# Patient Record
Sex: Male | Born: 1945
Health system: Southern US, Community
[De-identification: ages and names within clinical notes are randomized; demographics above are authoritative.]

## PROBLEM LIST (undated history)

## (undated) DIAGNOSIS — L4050 Arthropathic psoriasis, unspecified: Secondary | ICD-10-CM

## (undated) DIAGNOSIS — M26629 Arthralgia of temporomandibular joint, unspecified side: Secondary | ICD-10-CM

## (undated) DIAGNOSIS — M1990 Unspecified osteoarthritis, unspecified site: Secondary | ICD-10-CM

## (undated) DIAGNOSIS — N400 Enlarged prostate without lower urinary tract symptoms: Secondary | ICD-10-CM

## (undated) DIAGNOSIS — I2510 Atherosclerotic heart disease of native coronary artery without angina pectoris: Secondary | ICD-10-CM

## (undated) DIAGNOSIS — E785 Hyperlipidemia, unspecified: Secondary | ICD-10-CM

## (undated) HISTORY — DX: Arthropathic psoriasis, unspecified: L40.50

## (undated) HISTORY — DX: Hyperlipidemia, unspecified: E78.5

## (undated) HISTORY — DX: Atherosclerotic heart disease of native coronary artery without angina pectoris: I25.10

## (undated) HISTORY — DX: Benign prostatic hyperplasia without lower urinary tract symptoms: N40.0

## (undated) HISTORY — PX: TONSILLECTOMY: SUR1361

## (undated) HISTORY — DX: Arthralgia of temporomandibular joint, unspecified side: M26.629

## (undated) HISTORY — DX: Unspecified osteoarthritis, unspecified site: M19.90

---

## 2002-09-28 ENCOUNTER — Encounter: Admission: RE | Admit: 2002-09-28 | Discharge: 2002-09-28 | Payer: Self-pay | Admitting: Internal Medicine

## 2002-09-28 ENCOUNTER — Encounter: Payer: Self-pay | Admitting: Internal Medicine

## 2003-02-23 ENCOUNTER — Encounter (INDEPENDENT_AMBULATORY_CARE_PROVIDER_SITE_OTHER): Payer: Self-pay | Admitting: Surgery

## 2003-02-23 ENCOUNTER — Ambulatory Visit (HOSPITAL_BASED_OUTPATIENT_CLINIC_OR_DEPARTMENT_OTHER): Admission: RE | Admit: 2003-02-23 | Discharge: 2003-02-23 | Payer: Self-pay | Admitting: Surgery

## 2004-06-17 ENCOUNTER — Ambulatory Visit: Payer: Self-pay | Admitting: Internal Medicine

## 2004-08-10 HISTORY — PX: COLONOSCOPY: SHX174

## 2004-10-01 ENCOUNTER — Encounter: Admission: RE | Admit: 2004-10-01 | Discharge: 2004-10-01 | Payer: Self-pay | Admitting: Internal Medicine

## 2004-12-29 ENCOUNTER — Ambulatory Visit: Payer: Self-pay | Admitting: Internal Medicine

## 2005-01-30 ENCOUNTER — Ambulatory Visit: Payer: Self-pay | Admitting: Gastroenterology

## 2005-02-13 ENCOUNTER — Ambulatory Visit: Payer: Self-pay | Admitting: Gastroenterology

## 2005-07-29 ENCOUNTER — Ambulatory Visit: Payer: Self-pay | Admitting: Internal Medicine

## 2007-03-23 ENCOUNTER — Ambulatory Visit: Payer: Self-pay | Admitting: Internal Medicine

## 2007-03-23 DIAGNOSIS — H902 Conductive hearing loss, unspecified: Secondary | ICD-10-CM | POA: Insufficient documentation

## 2007-03-31 ENCOUNTER — Encounter (INDEPENDENT_AMBULATORY_CARE_PROVIDER_SITE_OTHER): Payer: Self-pay | Admitting: *Deleted

## 2007-04-25 ENCOUNTER — Ambulatory Visit: Payer: Self-pay | Admitting: Internal Medicine

## 2007-04-25 LAB — CONVERTED CEMR LAB
Cholesterol, target level: 200 mg/dL
HDL goal, serum: 40 mg/dL
LDL Goal: 160 mg/dL

## 2007-04-28 ENCOUNTER — Encounter: Payer: Self-pay | Admitting: Internal Medicine

## 2007-04-28 ENCOUNTER — Ambulatory Visit: Admission: RE | Admit: 2007-04-28 | Discharge: 2007-04-28 | Payer: Self-pay | Admitting: Internal Medicine

## 2007-06-24 ENCOUNTER — Encounter: Payer: Self-pay | Admitting: Internal Medicine

## 2007-10-17 ENCOUNTER — Ambulatory Visit: Payer: Self-pay | Admitting: Internal Medicine

## 2007-10-28 LAB — CONVERTED CEMR LAB
Cholesterol: 194 mg/dL (ref 0–200)
HDL: 43.9 mg/dL (ref >39.0)
LDL Cholesterol: 129 mg/dL — ABNORMAL HIGH (ref 0–99)
Total CHOL/HDL Ratio: 4.4
Triglycerides: 108 mg/dL (ref 0–149)
VLDL: 22 mg/dL (ref 0–40)

## 2007-11-01 ENCOUNTER — Encounter (INDEPENDENT_AMBULATORY_CARE_PROVIDER_SITE_OTHER): Payer: Self-pay | Admitting: *Deleted

## 2008-04-19 ENCOUNTER — Encounter (INDEPENDENT_AMBULATORY_CARE_PROVIDER_SITE_OTHER): Payer: Self-pay | Admitting: *Deleted

## 2008-04-19 ENCOUNTER — Ambulatory Visit: Payer: Self-pay | Admitting: Internal Medicine

## 2008-04-22 LAB — CONVERTED CEMR LAB
Free T4: 0.9 ng/dL (ref 0.6–1.6)
Hgb A1c MFr Bld: 5.4 % (ref 4.6–6.0)
TSH: 1.04 microintl units/mL (ref 0.35–5.50)

## 2008-04-23 ENCOUNTER — Encounter (INDEPENDENT_AMBULATORY_CARE_PROVIDER_SITE_OTHER): Payer: Self-pay | Admitting: *Deleted

## 2008-04-30 ENCOUNTER — Encounter: Payer: Self-pay | Admitting: Internal Medicine

## 2009-10-25 ENCOUNTER — Ambulatory Visit: Payer: Self-pay | Admitting: Internal Medicine

## 2009-10-25 DIAGNOSIS — E785 Hyperlipidemia, unspecified: Secondary | ICD-10-CM | POA: Insufficient documentation

## 2009-10-25 DIAGNOSIS — R3916 Straining to void: Secondary | ICD-10-CM

## 2009-10-25 DIAGNOSIS — N401 Enlarged prostate with lower urinary tract symptoms: Secondary | ICD-10-CM

## 2009-12-19 ENCOUNTER — Ambulatory Visit: Payer: Self-pay | Admitting: Internal Medicine

## 2009-12-23 LAB — CONVERTED CEMR LAB
ALT: 21 units/L (ref 0–53)
AST: 24 units/L (ref 0–37)
Albumin: 4 g/dL (ref 3.5–5.2)
Alkaline Phosphatase: 56 units/L (ref 39–117)
Bilirubin, Direct: 0.2 mg/dL (ref 0.0–0.3)
Cholesterol: 185 mg/dL (ref 0–200)
HDL: 50.8 mg/dL (ref >39.00)
LDL Cholesterol: 114 mg/dL — ABNORMAL HIGH (ref 0–99)
Total Bilirubin: 0.9 mg/dL (ref 0.3–1.2)
Total CHOL/HDL Ratio: 4
Total Protein: 6.7 g/dL (ref 6.0–8.3)
Triglycerides: 100 mg/dL (ref 0.0–149.0)
VLDL: 20 mg/dL (ref 0.0–40.0)

## 2010-02-25 ENCOUNTER — Telehealth (INDEPENDENT_AMBULATORY_CARE_PROVIDER_SITE_OTHER): Payer: Self-pay | Admitting: *Deleted

## 2010-07-14 ENCOUNTER — Ambulatory Visit: Payer: Self-pay | Admitting: Internal Medicine

## 2010-07-14 DIAGNOSIS — M26629 Arthralgia of temporomandibular joint, unspecified side: Secondary | ICD-10-CM | POA: Insufficient documentation

## 2010-08-28 ENCOUNTER — Encounter: Payer: Self-pay | Admitting: Internal Medicine

## 2010-08-28 DIAGNOSIS — H9190 Unspecified hearing loss, unspecified ear: Secondary | ICD-10-CM | POA: Insufficient documentation

## 2010-09-07 LAB — CONVERTED CEMR LAB
ALT: 21 units/L (ref 0–53)
ALT: 22 units/L (ref 0–53)
AST: 23 units/L (ref 0–37)
AST: 24 units/L (ref 0–37)
Albumin: 3.9 g/dL (ref 3.5–5.2)
Albumin: 4 g/dL (ref 3.5–5.2)
Alkaline Phosphatase: 62 units/L (ref 39–117)
Alkaline Phosphatase: 64 units/L (ref 39–117)
BUN: 19 mg/dL (ref 6–23)
BUN: 23 mg/dL (ref 6–23)
Basophils Absolute: 0 10*3/uL (ref 0.0–0.1)
Basophils Absolute: 0 10*3/uL (ref 0.0–0.1)
Basophils Relative: 0.1 % (ref 0.0–1.0)
Basophils Relative: 0.3 % (ref 0.0–3.0)
Bilirubin, Direct: 0.1 mg/dL (ref 0.0–0.3)
Bilirubin, Direct: 0.1 mg/dL (ref 0.0–0.3)
CO2: 32 meq/L (ref 19–32)
CO2: 34 meq/L — ABNORMAL HIGH (ref 19–32)
Calcium: 9.4 mg/dL (ref 8.4–10.5)
Calcium: 9.7 mg/dL (ref 8.4–10.5)
Chloride: 103 meq/L (ref 96–112)
Chloride: 103 meq/L (ref 96–112)
Cholesterol: 206 mg/dL — ABNORMAL HIGH (ref 0–200)
Creatinine, Ser: 1.1 mg/dL (ref 0.4–1.5)
Creatinine, Ser: 1.2 mg/dL (ref 0.4–1.5)
Direct LDL: 137.3 mg/dL
Eosinophils Absolute: 0.1 10*3/uL (ref 0.0–0.6)
Eosinophils Absolute: 0.1 10*3/uL (ref 0.0–0.7)
Eosinophils Relative: 1.1 % (ref 0.0–5.0)
Eosinophils Relative: 1.3 % (ref 0.0–5.0)
GFR calc Af Amer: 79 mL/min
GFR calc non Af Amer: 66 mL/min
GFR calc non Af Amer: 71.75 mL/min (ref >60)
Glucose, Bld: 100 mg/dL — ABNORMAL HIGH (ref 70–99)
Glucose, Bld: 97 mg/dL (ref 70–99)
HCT: 47 % (ref 39.0–52.0)
HCT: 47.6 % (ref 39.0–52.0)
HDL: 51.5 mg/dL (ref >39.00)
Hemoglobin: 15.7 g/dL (ref 13.0–17.0)
Hemoglobin: 16.7 g/dL (ref 13.0–17.0)
Lymphocytes Relative: 36.7 % (ref 12.0–46.0)
Lymphocytes Relative: 37.7 % (ref 12.0–46.0)
Lymphs Abs: 2.2 10*3/uL (ref 0.7–4.0)
MCHC: 33.5 g/dL (ref 30.0–36.0)
MCHC: 35.1 g/dL (ref 30.0–36.0)
MCV: 89.6 fL (ref 78.0–100.0)
MCV: 96.5 fL (ref 78.0–100.0)
Monocytes Absolute: 0.4 10*3/uL (ref 0.2–0.7)
Monocytes Absolute: 0.5 10*3/uL (ref 0.1–1.0)
Monocytes Relative: 6 % (ref 3.0–11.0)
Monocytes Relative: 8.2 % (ref 3.0–12.0)
Neutro Abs: 3.3 10*3/uL (ref 1.4–7.7)
Neutro Abs: 3.5 10*3/uL (ref 1.4–7.7)
Neutrophils Relative %: 53.5 % (ref 43.0–77.0)
Neutrophils Relative %: 55.1 % (ref 43.0–77.0)
PSA: 0.92 ng/mL (ref 0.10–4.00)
Platelets: 231 10*3/uL (ref 150.0–400.0)
Platelets: 255 10*3/uL (ref 150–400)
Potassium: 4 meq/L (ref 3.5–5.1)
Potassium: 4.2 meq/L (ref 3.5–5.1)
RBC: 4.87 M/uL (ref 4.22–5.81)
RBC: 5.32 M/uL (ref 4.22–5.81)
RDW: 11.7 % (ref 11.5–14.6)
RDW: 12 % (ref 11.5–14.6)
Sodium: 139 meq/L (ref 135–145)
Sodium: 142 meq/L (ref 135–145)
TSH: 1.26 microintl units/mL (ref 0.35–5.50)
TSH: 1.44 microintl units/mL (ref 0.35–5.50)
Total Bilirubin: 0.8 mg/dL (ref 0.3–1.2)
Total Bilirubin: 0.8 mg/dL (ref 0.3–1.2)
Total CHOL/HDL Ratio: 4
Total Protein: 6.9 g/dL (ref 6.0–8.3)
Total Protein: 7.1 g/dL (ref 6.0–8.3)
Triglycerides: 106 mg/dL (ref 0.0–149.0)
VLDL: 21.2 mg/dL (ref 0.0–40.0)
WBC: 6.1 10*3/uL (ref 4.5–10.5)
WBC: 6.5 10*3/uL (ref 4.5–10.5)

## 2010-09-09 NOTE — Progress Notes (Signed)
Summary: Call back request  Phone Note Call from Patient Call back at Work Phone [phone]   Caller: Patient Summary of Call: Message left on Triage VM: Please call (no other information provided)  Shonna Chock CMA  February 25, 2010 10:41 AM   Follow-up for Phone Call        Left message on machine for patient to return call when avaliable, Reason for call:   per patient request   Follow-up by: Shonna Chock CMA,  February 25, 2010 5:11 PM  Additional Follow-up for Phone Call Additional follow up Details #1::        Left message on VM informing to return call, patient requested that I call Additional Follow-up by: Shonna Chock CMA,  February 27, 2010 3:35 PM    Additional Follow-up for Phone Call Additional follow up Details #2::    Left message on VM for patient at home number informing him to please call me if we can assist him in any way. We received a message from him requesting a call and have tried to contact him with no call back./Chrae George [facility] CMA  February 27, 2010 5:05 PM

## 2010-09-09 NOTE — Assessment & Plan Note (Signed)
Summary: cpx//pt will fasting//lch   Vital Signs:  Patient profile:   65 year old male Height:      68.25 inches Weight:      175.2 pounds BMI:     26.54 Temp:     97.9 degrees F oral Pulse rate:   65 / minute Resp:     16 per minute BP sitting:   114 / 76  (left arm) Cuff size:   large  Vitals Entered By: Shonna Chock (October 25, 2009 8:51 AM)  Comments REVIEWED MED LIST, PATIENT AGREED DOSE AND INSTRUCTION CORRECT    History of Present Illness: Mr. Belair is here for a physical; he is asymptomatic.Preventive health care measures  discussed.  Preventive Screening-Counseling & Management  Alcohol-Tobacco     Smoking Status: quit  Caffeine-Diet-Exercise     Does Patient Exercise: yes  Allergies (verified): No Known Drug Allergies  Past History:  Past Medical History: Hyperlipidemia: NMR 2008: LDL 154(1758/1010), HDL  48,TG 147. 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  The patient denies anorexia, fever, weight loss, weight gain, vision loss, hoarseness, chest pain, syncope, dyspnea on exertion, peripheral edema, prolonged cough, headaches, hemoptysis, abdominal pain, melena, hematochezia, severe indigestion/heartburn, hematuria, incontinence, suspicious skin lesions, depression, unusual weight change, abnormal bleeding, enlarged lymph nodes, and angioedema.    Physical Exam  General:  well-nourished; alert,appropriate and cooperative throughout examination Head:  Normocephalic and atraumatic without obvious abnormalities. Pattern alopecia  Eyes:  No  corneal or conjunctival inflammation noted.Perrla. Funduscopic exam benign, without hemorrhages, exudates or papilledema.  Ears:  External ear exam shows no significant lesions or deformities.  Otoscopic examination reveals clear canals, tympanic membranes are intact bilaterally without bulging, retraction, inflammation or discharge. Hearing is grossly normal bilaterally. Nose:  External nasal examination shows no deformity or inflammation. Nasal mucosa are pink and moist without lesions or exudates. Septum to R Mouth:  Oral mucosa and oropharynx without lesions or exudates.  Teeth in good repair. Neck:  No deformities, masses, or tenderness noted. Lungs:  Normal respiratory effort, chest expands symmetrically. Lungs are clear to auscultation, no crackles or wheezes. Heart:  Normal rate and regular rhythm. S1 and S2 normal without gallop, murmur, click, rub. S4 Abdomen:  Bowel sounds positive,abdomen soft and non-tender without masses, organomegaly or hernias noted. Rectal:  No external abnormalities noted. Normal sphincter tone. No rectal masses or tenderness. Genitalia:  Testes bilaterally descended without nodularity, tenderness or masses. No scrotal masses or lesions. No penis lesions or urethral discharge.L varicocele.   Prostate:  no nodules, no asymmetry, no induration,  2+ enlarged.   Msk:  No deformity or scoliosis noted of thoracic or lumbar spine.   Pulses:  R and L carotid,radial,dorsalis pedis and posterior tibial pulses are full and equal bilaterally Extremities:  No clubbing, cyanosis, edema, or deformity noted with normal full range of motion of all joints.  Minor crepitus of knees  Neurologic:  alert & oriented X3 and DTRs symmetrical and normal.   Skin:  Intact without suspicious lesions or rashes Cervical Nodes:  No lymphadenopathy noted Axillary Nodes:  No palpable lymphadenopathy Psych:  memory  intact for recent and remote, normally interactive, and good eye contact.      Impression & Recommendations:  Problem # 1:  ROUTINE GENERAL MEDICAL EXAM@HEALTH  CARE FACL (ICD-V70.0)  Orders: Venipuncture (57846) TLB-Lipid Panel (80061-LIPID) TLB-BMP (Basic Metabolic Panel-BMET) (80048-METABOL) TLB-CBC Platelet - w/Differential (85025-CBCD) TLB-Hepatic/Liver Function Pnl (80076-HEPATIC) TLB-TSH (Thyroid Stimulating Hormone) (84443-TSH) TLB-PSA (Prostate Specific Antigen) (84153-PSA) EKG w/ Interpretation (93000)  Problem # 2:  HYPERLIPIDEMIA (ICD-272.4)  Orders: Venipuncture (96295) TLB-Lipid Panel (80061-LIPID) EKG w/ Interpretation (93000)  Problem # 3:  BENIGN PROSTATIC HYPERTROPHY (ICD-600.00)  Asymptomatic  Orders: Venipuncture (28413) TLB-PSA (Prostate Specific Antigen) (84153-PSA)  Complete Medication List: 1)  Asa 81mg   .... 1 by mouth qd  Patient Instructions: 1)  Consider annual flu shot. 2)  Take  coated  Aspirin  81 mg (1-3) every day.  Appended Document: cpx//pt will fasting//lch  Laboratory Results   Urine Tests   Date/Time Reported: October 25, 2009 10:05 AM   Routine Urinalysis   Color: yellow Appearance: Clear Glucose: negative   (Normal Range: Negative) Bilirubin: negative   (Normal Range: Negative) Ketone: negative   (Normal Range: Negative) Spec. Gravity: <1.005   (Normal Range: 1.003-1.035) Blood: negative   (Normal Range: Negative) pH: 6.5   (Normal Range: 5.0-8.0) Protein: negative   (Normal Range: Negative) Urobilinogen: negative   (Normal Range: 0-1) Nitrite: negative   (Normal Range: Negative) Leukocyte Esterace: negative   (Normal Range: Negative)    Comments: Floydene Flock  October 25, 2009 10:06 AM

## 2010-09-09 NOTE — Assessment & Plan Note (Signed)
Summary: jaw or ear pain/cbs   Vital Signs:  Patient profile:   65 year old male Weight:      179 pounds BMI:     27.12 Temp:     97.9 degrees F oral Pulse rate:   76 / minute Resp:     14 per minute BP sitting:   130 / 78  (left arm) Cuff size:   large  Vitals Entered By: Shonna Chock CMA (July 14, 2010 1:35 PM) CC: Right side jaw and ear pain x 2 weeks- worse when chewing , Ear pain   CC:  Right side jaw and ear pain x 2 weeks- worse when chewing  and Ear pain.  History of Present Illness: Ear Pain      This is a 65 year old man who presents with Ear pain X 2 weeks w/o injury. No exertional neck, jaw or neck pain. The patient denies ear discharge, sensation of fullness, hearing loss, tinnitus, fever, sinus pain, nasal discharge, and jaw click.  The pain is located anterior to  &  in the right ear after  chewing.  The pain is described as intermittent and  increased  by chewing.  The patient denies headache, night grinding of teeth, and popping or crackling sounds.  Rx: Advil with benefit.  Current Medications (verified): 1)  Asa 81mg  .... 1 By Mouth Qd 2)  Pravastatin Sodium 20 Mg Tabs (Pravastatin Sodium) .Marland Kitchen.. 1 At Bedtime  Allergies (verified): No Known Drug Allergies  Physical Exam  General:  well-nourished;alert,appropriate and cooperative throughout examination Ears:  External ear exam shows no significant lesions or deformities.  Otoscopic examination reveals clear canals, tympanic membranes are intact bilaterally without bulging, retraction, inflammation or discharge. Hearing is grossly normal bilaterally. Nose:  External nasal examination shows no deformity or inflammation. Nasal mucosa are pink and moist without lesions or exudates. Septum deviated to R Mouth:  Oral mucosa and oropharynx without lesions or exudates.  Teeth in good repair. Mild TMJ symptoms on R. No masseter tenderness Cervical Nodes:  No lymphadenopathy noted Axillary Nodes:  No palpable  lymphadenopathy   Impression & Recommendations:  Problem # 1:  TEMPOROMANDIBULAR JOINT PAIN (ICD-524.62)  Complete Medication List: 1)  Asa 81mg   .... 1 by mouth qd 2)  Pravastatin Sodium 20 Mg Tabs (Pravastatin sodium) .Marland Kitchen.. 1 at bedtime  Patient Instructions: 1)  Warm compresses three times a day as needed . Avoid gum , seeds & tough foods as discussed. Glucosamine 1500 mg once daily for 4-6 weeks. See your Dentist if no better. 2)  Take 400-600mg  of Ibuprofen (Advil, Motrin) with food every 4-6 hours as needed for relief of pain  with food, NOT at bedtime    Orders Added: 1)  Est. Patient Level III [16606]

## 2010-10-23 ENCOUNTER — Telehealth (INDEPENDENT_AMBULATORY_CARE_PROVIDER_SITE_OTHER): Payer: Self-pay | Admitting: *Deleted

## 2010-10-27 ENCOUNTER — Ambulatory Visit (INDEPENDENT_AMBULATORY_CARE_PROVIDER_SITE_OTHER): Payer: BC Managed Care – PPO | Admitting: Family Medicine

## 2010-10-27 ENCOUNTER — Encounter: Payer: Self-pay | Admitting: Family Medicine

## 2010-10-27 DIAGNOSIS — L03119 Cellulitis of unspecified part of limb: Secondary | ICD-10-CM | POA: Insufficient documentation

## 2010-10-27 DIAGNOSIS — L02419 Cutaneous abscess of limb, unspecified: Secondary | ICD-10-CM | POA: Insufficient documentation

## 2010-10-28 ENCOUNTER — Encounter: Payer: Self-pay | Admitting: Internal Medicine

## 2010-10-28 ENCOUNTER — Ambulatory Visit (INDEPENDENT_AMBULATORY_CARE_PROVIDER_SITE_OTHER): Payer: BC Managed Care – PPO | Admitting: Internal Medicine

## 2010-10-28 VITALS — BP 128/84 | HR 72 | Temp 98.2°F | Resp 16 | Ht 68.0 in | Wt 178.0 lb

## 2010-10-28 DIAGNOSIS — L0390 Cellulitis, unspecified: Secondary | ICD-10-CM

## 2010-10-28 DIAGNOSIS — Z8249 Family history of ischemic heart disease and other diseases of the circulatory system: Secondary | ICD-10-CM

## 2010-10-28 DIAGNOSIS — Z0000 Encounter for general adult medical examination without abnormal findings: Secondary | ICD-10-CM

## 2010-10-28 DIAGNOSIS — E785 Hyperlipidemia, unspecified: Secondary | ICD-10-CM

## 2010-10-28 DIAGNOSIS — N400 Enlarged prostate without lower urinary tract symptoms: Secondary | ICD-10-CM

## 2010-10-28 NOTE — Progress Notes (Signed)
  Subjective:    Patient ID: Jason Hart, male    DOB: 11-22-1945, 65 y.o.   MRN: [id number]  HPI Comments:  He is here for wellness exam; he is asymptomatic with the exception of active cellulitis of the right knee. He is on doxycycline as of  3/19/ 2012 from Dr. Beverely Low.    He is on pravastatin for hyperlipidemia. Significantly his father had heart attack at age 75. His prior advanced cholesterol testing in 2008 revealed an LDL or bad cholesterol of 914. This was comprised of 1758 total particles and 1000  small dense particles  for  risk of approximately 17-18%. His LDL goal would be less than 120 based on this advanced testing.     Review of Systems  Constitutional: Negative for fever, chills and fatigue.  HENT: Negative for hearing loss, rhinorrhea, trouble swallowing, dental problem and tinnitus.   Respiratory: Negative for cough, shortness of breath and wheezing.   Cardiovascular: Negative for chest pain, palpitations and leg swelling.  Gastrointestinal: Positive for nausea. Negative for abdominal pain, diarrhea, constipation and blood in stool.  Genitourinary: Negative for dysuria, hematuria, decreased urine volume and difficulty urinating.  Musculoskeletal: Positive for joint swelling and arthralgias. Back pain:  he continues to have pain in the knee related to the acute infection.  Skin: Positive for color change.  Neurological: Negative for light-headedness and headaches.  Hematological: Negative for adenopathy.  Psychiatric/Behavioral: Negative for sleep disturbance.       Objective:   Physical Exam  Constitutional: He appears well-nourished. No distress.  HENT:  Head: Normocephalic.  Right Ear: External ear normal.  Left Ear: External ear normal.  Nose: Nose normal.  Mouth/Throat: Oropharynx is clear and moist.        Septal deviation to the right  Eyes: Conjunctivae are normal. Pupils are equal, round, and reactive to light. No scleral icterus.  Neck: No  tracheal deviation present. No thyromegaly present.  Cardiovascular: Normal rate, regular rhythm and normal heart sounds.  Exam reveals no gallop and no friction rub.   No murmur heard. Pulmonary/Chest: Effort normal. He has no rales.  Abdominal: Soft. There is no tenderness.  Genitourinary: Rectum normal and penis normal.        A varicocele is present in the left scrotum. There is asymmetric benign prostatic hypertrophy. The right lobe of the prostate is 2+ increased compared to the left  which is 1.5+.  There is weakness in the left inguinal area but there is no true hernia.  Musculoskeletal:        Decreased range of motion of right knee; obvious enlargement right knee.  Lymphadenopathy:    He has no cervical adenopathy.  Skin: Skin is warm. There is erythema.        4 x 5 centimeter erythema right lateral knee with tenderness to palpation.  Psychiatric: He has a normal mood and affect. His behavior is normal.          Assessment & Plan:   #1 comprehensive evaluation completed    #2 active cellulitis right knee ; warning signs discussed. Referral for incision and drainage by orthopedist may be necessary if there is not rapid resolution   #3 hyperlipidemia ; LDL goal is less than 120   #4 family history of premature coronary artery disease ( V. 17.3). An advanced cholesterol test [facility] heart panel/ 1304X) will be completed once the acute cellulitis is resolved.

## 2010-10-28 NOTE — Progress Notes (Signed)
Summary: refill  Phone Note Refill Request Message from:  Fax from Pharmacy on October 23, 2010 2:36 PM  Refills Requested: Medication #1:  PRAVASTATIN SODIUM 20 MG TABS 1 at bedtime. [facility] [employer] - fax [phone]   Initial call taken by: Okey Regal [place],  October 23, 2010 2:39 PM    Prescriptions: PRAVASTATIN SODIUM 20 MG TABS (PRAVASTATIN SODIUM) 1 at bedtime  #90 x 0   Entered by:   Shonna Chock CMA   Authorized by:   Marga Melnick MD   Signed by:   Shonna Chock CMA on 10/23/2010   Method used:   Electronically to        [facility] [address] [address] (retail)       [address]       [place], [place]  [address]       Ph: [phone]       Fax: [phone]   RxID:   [id number]

## 2010-10-28 NOTE — Patient Instructions (Signed)
Measure the area of cellulitis by outlining    in ink Call if having fever or red streaks going up the leg or increasing pain.

## 2010-11-03 ENCOUNTER — Telehealth: Payer: Self-pay | Admitting: Internal Medicine

## 2010-11-03 NOTE — Telephone Encounter (Signed)
Patient was seen for physical on 3/20, but said that Dr Alwyn Ren did not run blood work due to infected place on leg---was given antibiotics for 4-5 days----says swelling on leg has gone down and place is greatly improved---patient was given appt with Dr Alwyn Ren for Magdalene Molly 3/29 to check out leg, then go to lab.  1)  Wants to check to see if Dr Alwyn Ren actually needs to check the leg or can he just have a lab appointment??   If it is just labs, I will need orders and codes please    2)   Does he need a refill for his antibiotic to carry him thru until Thursday??

## 2010-11-04 NOTE — Telephone Encounter (Signed)
Left msg for pt to return call.

## 2010-11-05 ENCOUNTER — Encounter: Payer: Self-pay | Admitting: Internal Medicine

## 2010-11-06 ENCOUNTER — Encounter: Payer: Self-pay | Admitting: Internal Medicine

## 2010-11-06 ENCOUNTER — Ambulatory Visit (INDEPENDENT_AMBULATORY_CARE_PROVIDER_SITE_OTHER): Payer: BC Managed Care – PPO | Admitting: Internal Medicine

## 2010-11-06 VITALS — BP 102/68 | HR 60 | Temp 97.9°F

## 2010-11-06 DIAGNOSIS — L02419 Cutaneous abscess of limb, unspecified: Secondary | ICD-10-CM

## 2010-11-06 MED ORDER — DOXYCYCLINE HYCLATE 100 MG PO CPEP: 100.0000 mg | ORAL_CAPSULE | Freq: Two times a day (BID) | ORAL | Status: AC

## 2010-11-06 NOTE — Patient Instructions (Signed)
Please report the warning signs of increasing pain or swelling, red streaks traveling up the right leg, and pus. If a pustule does appear,it  would be valuable to perform a culture for bacteria identification.

## 2010-11-06 NOTE — Progress Notes (Signed)
  Subjective:    Patient ID: Jason Hart, male    DOB: June 29, 1946, 65 y.o.   MRN: [id number]  HPI the cellulitic lesion over his right lateral knee did not show significant improvement until he squeezed it producing some purulent material. He did this on 3/23 and 3/24. Since that time it has decreased in size.  He denies fever chills or sweats.  He's had no adverse symptoms with the doxycycline.  He denied any swelling, redness, or pain in the joint itself.  He completed the doxycycline on 3/26.          Review of Systems     Objective:   Physical Exam there is an area of faint erythema and slight ballotability encompassing 26 x 35 mm over the lateral aspect of the right knee. This slight erythema over the lesion does blanch with pressure. He denies any tenderness to palpation.  There's mild crepitus in the right knee with range of motion but range of motion is normal  There is evidence of exfoliation around the lesion  Pedal pulses are intact and the skin is otherwise normal.  He has no inguinal  lymphadenopathy.        Assessment & Plan:  #1 cellulitis/abscess right lateral knee. There is no evidence of any joint involvement or lymphangitis.  There appears to be mild residual cellulitic process in this area ; it is slightly clinically much improved.  Plan: I would recommend continuing the doxycycline until the edema and erythema has resolved totally. Showed a pustule appear it is recommended that this be lanced and a culture taken if possible

## 2010-11-06 NOTE — Assessment & Plan Note (Signed)
Summary: left knee pain--PH   Vital Signs:  Patient profile:   65 year old male Height:      68.25 inches (173.35 cm) Weight:      177.13 pounds (80.51 kg) BMI:     26.83 Temp:     98.0 degrees F (36.67 degrees C) oral BP sitting:   120 / 70  (left arm)  Vitals Entered By: Lucious Groves CMA (October 27, 2010 9:00 AM) CC: C/O right knee pain x 4 days./kb Is Patient Diabetic? No Pain Assessment Patient in pain? yes     Location: knee Intensity: 7 Type: sharp Onset of pain  4 days Comments Patient notes that it is swollen and seems as if he was bitten by something.   History of Present Illness: 65 yo man here today for R knee pain.  first noticed small 'pimple' 6 days ago.  2 days later 'whole knee' began hurting.  + throbbing, redness, swelling, warmth.  pain w/ flexion.  no pain w/ extension.  no pain w/ weight bearing.  able to play golf yesterday.  took a 'couple of aleve' prior to golf yesterday.  Current Medications (verified): 1)  Asa 81mg  .... 1 By Mouth Qd 2)  Pravastatin Sodium 20 Mg Tabs (Pravastatin Sodium) .Marland Kitchen.. 1 At Bedtime  Allergies (verified): No Known Drug Allergies  Review of Systems      See HPI  Physical Exam  General:  well-nourished;alert,appropriate and cooperative throughout examination Pulses:  +2 popliteal, DP/PT Extremities:  R lateral knee erythema, edema, warmth.  3.5 cm area of erythema and induration w/ central pore.  no fluctuance or drainage.  full extension of knee, some limitation of flexion   Impression & Recommendations:  Problem # 1:  CELLULITIS, RIGHT KNEE (ICD-682.6) Assessment New pt w/ obvious infxn of the R knee.  no apparent abscess or septic joint at this time.  start Doxy to cover for possible MRSA.  reviewed red flags that prompt immediate return- including fever, red streaking, increased pain. His updated medication list for this problem includes:    Doxycycline Hyclate 100 Mg Caps (Doxycycline hyclate) .Marland Kitchen... Take 1 tab  twice a day.  take w/ food.  Complete Medication List: 1)  Asa 81mg   .... 1 by mouth qd 2)  Pravastatin Sodium 20 Mg Tabs (Pravastatin sodium) .Marland Kitchen.. 1 at bedtime 3)  Doxycycline Hyclate 100 Mg Caps (Doxycycline hyclate) .... Take 1 tab twice a day.  take w/ food.  Patient Instructions: 1)  Follow up as scheduled tomorrow for your physical- have Dr Alwyn Ren recheck your knee! 2)  Take the Doxycycline as directed- take w/ food to avoid upset stomach 3)  Alternate heat and ice on the knee 4)  Call with any questions or concerns 5)  Hang in there!!! Prescriptions: DOXYCYCLINE HYCLATE 100 MG CAPS (DOXYCYCLINE HYCLATE) Take 1 tab twice a day.  take w/ food.  #20 x 0   Entered and Authorized by:   Neena Rhymes MD   Signed by:   Neena Rhymes MD on 10/27/2010   Method used:   Electronically to        [facility] [address] (retail)       [address]       [place], [place]  [address]       Ph: [phone]       Fax: [phone]   RxID:   [id number]    Orders Added: 1)  Est. Patient Level III [id number]

## 2010-11-06 NOTE — Telephone Encounter (Signed)
Pt has office scheduled.

## 2010-12-26 NOTE — Op Note (Signed)
   NAME:  Jason Hart, Jason Hart                     ACCOUNT NO.:  [id number]   MEDICAL RECORD NO.:  [id number]                   PATIENT TYPE:  AMB   LOCATION:  [facility]                                  FACILITY:  [facility]   PHYSICIAN:  Velora Heckler, M.D.                DATE OF BIRTH:  [birth year]   DATE OF PROCEDURE:  02/23/2003  DATE OF DISCHARGE:                                 OPERATIVE REPORT   PREOPERATIVE DIAGNOSIS:  Cutaneous lesion, left posterior scalp with atypia.   POSTOPERATIVE DIAGNOSIS:  Cutaneous lesion, left posterior scalp with  atypia.   PROCEDURE:  Wide local excision of cutaneous lesion, left posterior scalp.   SURGEON:  Velora Heckler, M.D.   ANESTHESIA:  Local.   ESTIMATED BLOOD LOSS:  Minimal.   PREPARATION:  Betadine.   COMPLICATIONS:  None.   INDICATIONS FOR PROCEDURE:  The patient is a 65 year old white male who has  undergone an excision of a cutaneous lesion from the left posterior scalp on  two occasions by Dr. Leticia Clas.  Atypia was noted on both specimens.  The  surgical margin was not obtained.  The patient now comes to surgery for a  wide local excision of previous operative site.   DESCRIPTION OF PROCEDURE:  The procedure is done in the minor treatment room  at [facility].  The patient is brought to  the operating room and placed in the right lateral decubitus position.  Hair  is shaved from around the lesion at the left posterior scalp.  Skin is  prepped with Betadine.  The skin is anesthetized with local anesthetic.  An  elliptical incision is made with a #15 blade, so as to encompass the  previous old scar plus approximately a 2.0 mm margin of normal tissue.  Dissection was carried full thickness through the scalp into the  subcutaneous tissues and down to the galea fascia.  The specimen was  submitted to  pathology for review.  Hemostasis is obtained with the ophthalmologic  cautery.  The skin edges were  reapproximated with interrupted #3-0 nylon  simple sutures.  Bacitracin ointment was placed along the suture line.  The patient tolerated the procedure well.                                                Velora Heckler, M.D.    TMG/MEDQ  D:  02/23/2003  T:  02/25/2003  Job:  [id number]   cc:   Mertha Finders., M.D.  [id number] [place]  [place] [address]  Fax: [phone]

## 2011-03-10 ENCOUNTER — Other Ambulatory Visit: Payer: Self-pay | Admitting: Internal Medicine

## 2011-05-26 ENCOUNTER — Other Ambulatory Visit: Payer: BC Managed Care – PPO

## 2011-05-28 ENCOUNTER — Other Ambulatory Visit: Payer: Self-pay | Admitting: Internal Medicine

## 2011-05-28 DIAGNOSIS — Z0000 Encounter for general adult medical examination without abnormal findings: Secondary | ICD-10-CM

## 2011-05-29 ENCOUNTER — Other Ambulatory Visit (INDEPENDENT_AMBULATORY_CARE_PROVIDER_SITE_OTHER): Payer: BC Managed Care – PPO

## 2011-05-29 DIAGNOSIS — Z0000 Encounter for general adult medical examination without abnormal findings: Secondary | ICD-10-CM

## 2011-05-29 LAB — CBC WITH DIFFERENTIAL/PLATELET
Basophils Relative: 0.5 % (ref 0.0–3.0)
Eosinophils Relative: 1.3 % (ref 0.0–5.0)
HCT: 48.6 % (ref 39.0–52.0)
Hemoglobin: 16.3 g/dL (ref 13.0–17.0)
Lymphs Abs: 2.8 10*3/uL (ref 0.7–4.0)
MCV: 94.4 fl (ref 78.0–100.0)
Monocytes Absolute: 0.5 10*3/uL (ref 0.1–1.0)
Neutro Abs: 4.7 10*3/uL (ref 1.4–7.7)
Neutrophils Relative %: 58 % (ref 43.0–77.0)
RBC: 5.15 Mil/uL (ref 4.22–5.81)
WBC: 8.1 10*3/uL (ref 4.5–10.5)

## 2011-05-29 LAB — BASIC METABOLIC PANEL WITH GFR
BUN: 21 mg/dL (ref 6–23)
CO2: 30 meq/L (ref 19–32)
Calcium: 9 mg/dL (ref 8.4–10.5)
Chloride: 103 meq/L (ref 96–112)
Creatinine, Ser: 1.1 mg/dL (ref 0.4–1.5)
GFR: 71.39 mL/min
Glucose, Bld: 93 mg/dL (ref 70–99)
Potassium: 4.2 meq/L (ref 3.5–5.1)
Sodium: 140 meq/L (ref 135–145)

## 2011-05-29 LAB — LIPID PANEL
Cholesterol: 156 mg/dL (ref 0–200)
HDL: 46.9 mg/dL
LDL Cholesterol: 95 mg/dL (ref 0–99)
Total CHOL/HDL Ratio: 3
Triglycerides: 72 mg/dL (ref 0.0–149.0)
VLDL: 14.4 mg/dL (ref 0.0–40.0)

## 2011-05-29 LAB — TSH: TSH: 0.58 u[IU]/mL (ref 0.35–5.50)

## 2011-05-29 LAB — HEPATIC FUNCTION PANEL
ALT: 21 U/L (ref 0–53)
AST: 27 U/L (ref 0–37)
Albumin: 4.1 g/dL (ref 3.5–5.2)
Alkaline Phosphatase: 59 U/L (ref 39–117)
Bilirubin, Direct: 0 mg/dL (ref 0.0–0.3)
Total Bilirubin: 0.7 mg/dL (ref 0.3–1.2)
Total Protein: 7.3 g/dL (ref 6.0–8.3)

## 2011-05-29 LAB — PSA: PSA: 0.92 ng/mL (ref 0.10–4.00)

## 2011-05-29 NOTE — Progress Notes (Signed)
Labs only

## 2011-05-29 NOTE — Progress Notes (Signed)
Addended by: Legrand Como on: 05/29/2011 10:17 AM   Modules accepted: Orders

## 2011-06-16 ENCOUNTER — Encounter: Payer: Self-pay | Admitting: Internal Medicine

## 2011-06-18 ENCOUNTER — Ambulatory Visit: Payer: BC Managed Care – PPO | Admitting: Internal Medicine

## 2011-06-22 ENCOUNTER — Other Ambulatory Visit: Payer: Self-pay | Admitting: Internal Medicine

## 2011-06-22 ENCOUNTER — Ambulatory Visit: Payer: BC Managed Care – PPO | Admitting: Internal Medicine

## 2011-06-23 ENCOUNTER — Ambulatory Visit (INDEPENDENT_AMBULATORY_CARE_PROVIDER_SITE_OTHER): Payer: BC Managed Care – PPO | Admitting: Internal Medicine

## 2011-06-23 ENCOUNTER — Encounter: Payer: Self-pay | Admitting: Internal Medicine

## 2011-06-23 DIAGNOSIS — R0789 Other chest pain: Secondary | ICD-10-CM

## 2011-06-23 DIAGNOSIS — E785 Hyperlipidemia, unspecified: Secondary | ICD-10-CM

## 2011-06-23 MED ORDER — RANITIDINE HCL 150 MG PO TABS: 150.0000 mg | ORAL_TABLET | Freq: Two times a day (BID) | ORAL | Status: DC

## 2011-06-23 NOTE — Patient Instructions (Addendum)
Please  schedule Labs : free T4, free T3, TSH (794.5).  Please bring these instructions to that Lab appt.   Your chest symptoms may be related to dyspepsia/ reflux.The triggers for dyspepsia or "heart burn"  include stress; the "aspirin family" ; alcohol; peppermint; and caffeine (coffee, tea, cola, and chocolate). The aspirin family would include aspirin and the nonsteroidal agents such as ibuprofen &  Naproxen. Tylenol would not cause reflux. If having dyspepsia ; food & drink should be avoided for @ least 2 hours before going to bed.

## 2011-06-23 NOTE — Progress Notes (Signed)
  Subjective:    Patient ID: Jason Hart, male    DOB: 1945-10-10, 65 y.o.   MRN: [id number]  HPI Dyslipidemia assessment: Prior Advanced Lipid Testing: NMR 2008: LDL goal = < 115.   Family history of premature CAD/ MI: Father MI @ 14 .  Nutrition: no plan .  Exercise: 3x/ week weights . Diabetes : no . HTN: no. Smoking history  : quit in college .    Lab results reviewed :all @ goal except TSH is low normal.     Review of Systems Weight :  Stable; fatigue: no ; claudication: no; palpitations: no; abd pain/bowel changes: no ; myalgias:no;  syncope : no ; memory loss: no;skin changes: no.  For 3-4 months he describes substernal discomfort as "tightness" when he starts to exercise. This occurs not only working with weights and also walking up he'll playing golf. This will resolve with burping. It does not recur if he continues to exercise. The pain does not radiate to the back it is not associated with nausea or sweating.  He is unsure as to whether he only has these symptoms with exercise after eating. He denies dyspepsia, heartburn, dysphagia, rectal bleeding or melena.    Objective:   Physical Exam General appearance is one of good health and nourishment w/o distress.  Eyes: No conjunctival inflammation or scleral icterus is present. No proptosis or lid lag.  Neck: Thyroid is normal to palpation without nodularity  Oral exam: Dental hygiene is good; lips and gums are healthy appearing.There is no oropharyngeal erythema or exudate noted.   Heart:  Normal rate and regular rhythm. S1 and S2 normal without gallop, murmur, click, rub.S 4 with slurring  Lungs:Chest clear to auscultation; no wheezes, rhonchi,rales ,or rubs present.No increased work of breathing.   Abdomen: bowel sounds normal, soft and non-tender without masses, organomegaly or hernias noted.  No guarding or rebound   Skin:Warm & dry.  Intact without suspicious lesions or rashes ; no jaundice or  tenting.  Muscle skeletal: No onycholysis of the nails. No clubbing, cyanosis or edema present.  Lymphatic: No lymphadenopathy is noted about the head, neck, axilla  areas.              Assessment & Plan:   #1 dyslipidemia; values are @ goal. This is in the context of family history of premature coronary disease. No change in the pravastatin.  #2 atypical substernal chest discomfort. EKG and possibly stress test may be necessary.If these are negative and symptoms persist endoscopic evaluation may be  appropriate.  #3 low normal TSH; recheck indicated in 3 months with free T3 & free T4  to rule out early hyperthyroidism.  EKG is normal with no ischemic change. A trial of ranitidine will be initiated. A stress test will be scheduled if the symptoms persist

## 2011-07-21 ENCOUNTER — Ambulatory Visit: Payer: BC Managed Care – PPO | Admitting: Internal Medicine

## 2012-02-03 ENCOUNTER — Ambulatory Visit (INDEPENDENT_AMBULATORY_CARE_PROVIDER_SITE_OTHER): Payer: BC Managed Care – PPO | Admitting: Internal Medicine

## 2012-02-03 ENCOUNTER — Encounter: Payer: Self-pay | Admitting: Internal Medicine

## 2012-02-03 VITALS — BP 126/80 | HR 78 | Temp 97.9°F | Wt 177.2 lb

## 2012-02-03 DIAGNOSIS — M79672 Pain in left foot: Secondary | ICD-10-CM

## 2012-02-03 DIAGNOSIS — M79609 Pain in unspecified limb: Secondary | ICD-10-CM

## 2012-02-03 DIAGNOSIS — M79671 Pain in right foot: Secondary | ICD-10-CM

## 2012-02-03 NOTE — Patient Instructions (Addendum)
Dr Pearletha Forge is @ [facility] [phone]

## 2012-02-03 NOTE — Progress Notes (Signed)
  Subjective:    Patient ID: Jason Hart, male    DOB: 06-Mar-1946, 66 y.o.   MRN: [id number]  HPI  For several months he has had almost constant pain with ambulation between the pads of his feet in the midanterior area. This is worse immediately upon arising in the morning . Wearing bedroom sleep shoes have helped some. He'll take 3 Advil in the morning with some benefit. Arch supports did not help  This may have started in the context of his  golf  stance . It has been benefited by certain shoe wear particularly if the heel is flat.    Review of Systems He denies any claudication-type symptoms. He is not had redness, heat, or swelling in the joints. Skin no rash associated with the present symptoms.     Objective:   Physical Exam  He appears healthy and well-nourished in no acute distress  Deep tendon reflexes, strength, and tone are all normal.  Pedal pulses are excellent. There no ischemic skin changes. Hair growth is excellent of the toes.  He has some minor chronic toenail changes.  His arch tends to be flat. There is no tenderness to palpation or percussion over the arch of the foot when it is hyper flexed. No  Achilles tendon tenderness. There is tenderness to palpation in the mid anterior sole bilaterally at the bases of the  toes        Assessment & Plan:  #1 pain in the mid anterior sole of each foot; rule out bilateral Morton's neuroma or meta tarsal bursitis.  He does experience improvement with nonsteroidals; unfortunately this increases the risk of GI or cardiac issues. Referral to sports medicine specialist will be pursued as there may be a mechanical component to  his symptoms.

## 2012-02-08 ENCOUNTER — Ambulatory Visit (INDEPENDENT_AMBULATORY_CARE_PROVIDER_SITE_OTHER): Payer: BC Managed Care – PPO | Admitting: Family Medicine

## 2012-02-08 ENCOUNTER — Encounter: Payer: Self-pay | Admitting: Family Medicine

## 2012-02-08 VITALS — BP 142/92 | HR 73 | Temp 97.8°F | Ht 67.0 in | Wt 170.0 lb

## 2012-02-08 DIAGNOSIS — M79609 Pain in unspecified limb: Secondary | ICD-10-CM

## 2012-02-08 DIAGNOSIS — M7740 Metatarsalgia, unspecified foot: Secondary | ICD-10-CM

## 2012-02-08 DIAGNOSIS — M7750 Other enthesopathy of unspecified foot: Secondary | ICD-10-CM

## 2012-02-08 DIAGNOSIS — M79671 Pain in right foot: Secondary | ICD-10-CM

## 2012-02-12 ENCOUNTER — Encounter: Payer: Self-pay | Admitting: Family Medicine

## 2012-02-12 DIAGNOSIS — M7740 Metatarsalgia, unspecified foot: Secondary | ICD-10-CM | POA: Insufficient documentation

## 2012-02-12 DIAGNOSIS — M79671 Pain in right foot: Secondary | ICD-10-CM | POA: Insufficient documentation

## 2012-02-12 NOTE — Assessment & Plan Note (Signed)
2/2 metatarsalgia.  Metatarsal pads and sports insoles provided - felt more comfortable with these in place.  No evidence stress fracture and not involved in high impact activities as a risk factor.  Icing, relative rest, tylenol/nsaids as needed.  Consider custom orthotics in future vs showing patient how to order temporary ones with metatarsal pads.

## 2012-02-12 NOTE — Progress Notes (Signed)
  Subjective:    Patient ID: Jason Hart, male    DOB: 1945-11-30, 66 y.o.   MRN: [id number]  PCP: Dr. Alwyn Ren  HPI 66 yo M here for bilateral foot pain.  Patient denies known injury. States for past 5 months has had pain in bilateral forefoot area in mornings and worse at end of day. Worse with prolonged standing. No swelling or bruising. Plays golf - worse with swinging when on forefoot area. Tried different shoes, advil, online orthotics.  Past Medical History  Diagnosis Date  . Trigger point with temporomandibular joint pain   . BPH (benign prostatic hypertrophy)   . Conductive hearing loss   . Hyperlipidemia     Current Outpatient Prescriptions on File Prior to Visit  Medication Sig Dispense Refill  . aspirin 81 MG tablet Take 81 mg by mouth daily.        . pravastatin (PRAVACHOL) 20 MG tablet TAKE 1 TABLET AT BEDTIME.  90 tablet  3    Past Surgical History  Procedure Date  . Tonsillectomy   . Colonoscopy     Negative;due 2016  . Tonsillectomy     No Known Allergies  History   Social History  . Marital Status: Married    Spouse Name: N/A    Number of Children: N/A  . Years of Education: N/A   Occupational History  . SALES    Social History Main Topics  . Smoking status: Never Smoker   . Smokeless tobacco: Never Used  . Alcohol Use: Yes     Couple times weekly-wine   . Drug Use: No  . Sexually Active: Not on file   Other Topics Concern  . Not on file   Social History Narrative   REG EXERCISE    Family History  Problem Relation Age of Onset  . Cancer Mother   . Hyperlipidemia Father   . Heart attack Father   . Sudden death Father   . Hypertension Neg Hx   . Diabetes Neg Hx     BP 142/92  Pulse 73  Temp 97.8 F (36.6 C) (Oral)  Ht 5\' 7"  (1.702 m)  Wt 170 lb (77.111 kg)  BMI 26.63 kg/m2  Review of Systems See HPI above.    Objective:   Physical Exam Gen: NAD  Bilateral feet/ankles: Mild overpronation.  Reversal of  transverse arches but minimal callus.   FROM ankles with 5/5 strength. TTP plantar 3rd MT head, minimal 2nd and 4th bilaterally. Negative ant drawer and talar tilt.   Negative syndesmotic compression. Thompsons test negative. NV intact distally.  MSK u/s: No evidence cortical irregularity, edema, neovascularity to suggest stress fracture.    Assessment & Plan:  1. Bilateral foot pain - 2/2 metatarsalgia.  Metatarsal pads and sports insoles provided - felt more comfortable with these in place.  No evidence stress fracture and not involved in high impact activities as a risk factor.  Icing, relative rest, tylenol/nsaids as needed.  Consider custom orthotics in future vs showing patient how to order temporary ones with metatarsal pads.

## 2012-03-01 ENCOUNTER — Encounter: Payer: Self-pay | Admitting: Family Medicine

## 2012-03-01 ENCOUNTER — Ambulatory Visit (INDEPENDENT_AMBULATORY_CARE_PROVIDER_SITE_OTHER): Payer: BC Managed Care – PPO | Admitting: Family Medicine

## 2012-03-01 VITALS — BP 126/82 | HR 76 | Temp 97.7°F | Ht 67.0 in | Wt 170.0 lb

## 2012-03-01 DIAGNOSIS — M25531 Pain in right wrist: Secondary | ICD-10-CM

## 2012-03-01 DIAGNOSIS — M25539 Pain in unspecified wrist: Secondary | ICD-10-CM

## 2012-03-01 NOTE — Patient Instructions (Addendum)
You have carpal tunnel syndrome. Wear the wrist splint at nighttime and as often as possible during the day Take ibuprofen or aleve for pain and inflammation. Corticosteroid injection is a consideration to help with pain and inflammation if not improving as expected over next month to 6 weeks. If not improving, will consider nerve conduction studies to assess severity. Surgery tends to work well for this if you do not improve with conservative care. Ok for activities (including golf) as tolerated. Follow up with me in 1 month for reevaluation.

## 2012-03-02 ENCOUNTER — Encounter: Payer: Self-pay | Admitting: Family Medicine

## 2012-03-02 DIAGNOSIS — M25531 Pain in right wrist: Secondary | ICD-10-CM | POA: Insufficient documentation

## 2012-03-02 NOTE — Assessment & Plan Note (Signed)
consistent with developing carpal tunnel syndrome but with underlying DJD at wrist and 1st CMC.  Reassured.  Start with cockup wrist splint at bedtime and as much as possible during day.  Relative rest, icing as needed (take care around median nerve though - discussed).  Nsaids, tylenol for pain and inflammation.  F/u in 1 month to 6 weeks.  If not improving consider injection vs nerve conduction study.  See instructions for further.

## 2012-03-02 NOTE — Progress Notes (Signed)
Subjective:    Patient ID: Jason Hart, male    DOB: September 11, 1945, 66 y.o.   MRN: [id number]  PCP: Dr. Alwyn Ren  Hand Pain    66 yo M here previously for bilateral foot pain now with left wrist/hand pain.  7/1: Patient denies known injury. States for past 5 months has had pain in bilateral forefoot area in mornings and worse at end of day. Worse with prolonged standing. No swelling or bruising. Plays golf - worse with swinging when on forefoot area. Tried different shoes, advil, online orthotics.  7/23: Patient reports for past 66 months has had worsening pain/tingling in palm of left hand, wrist pain. Worse after playing golf. Tried ibuprofen, icing. Knows he has a little arthritis in these areas. Does not go into fingers. No right hand symptoms. Is right handed.  Past Medical History  Diagnosis Date  . Trigger point with temporomandibular joint pain   . BPH (benign prostatic hypertrophy)   . Conductive hearing loss   . Hyperlipidemia     Current Outpatient Prescriptions on File Prior to Visit  Medication Sig Dispense Refill  . aspirin 81 MG tablet Take 81 mg by mouth daily.        . clobetasol (TEMOVATE) 0.05 % external solution       . pravastatin (PRAVACHOL) 20 MG tablet TAKE 1 TABLET AT BEDTIME.  90 tablet  3  . terbinafine (LAMISIL) 250 MG tablet         Past Surgical History  Procedure Date  . Tonsillectomy   . Colonoscopy     Negative;due 2016  . Tonsillectomy     No Known Allergies  History   Social History  . Marital Status: Married    Spouse Name: N/A    Number of Children: N/A  . Years of Education: N/A   Occupational History  . SALES    Social History Main Topics  . Smoking status: Never Smoker   . Smokeless tobacco: Never Used  . Alcohol Use: Yes     Couple times weekly-wine   . Drug Use: No  . Sexually Active: Not on file   Other Topics Concern  . Not on file   Social History Narrative   REG EXERCISE    Family History    Problem Relation Age of Onset  . Cancer Mother   . Hyperlipidemia Father   . Heart attack Father   . Sudden death Father   . Hypertension Neg Hx   . Diabetes Neg Hx     BP 126/82  Pulse 76  Temp 97.7 F (36.5 C) (Oral)  Ht 5\' 7"  (1.702 m)  Wt 170 lb (77.111 kg)  BMI 26.63 kg/m2  Review of Systems  See HPI above.    Objective:   Physical Exam  Gen: NAD  R wrist/hand: No gross deformity, swelling, bruising, atrophy. TTP volar R wrist/palm, less at base of 1st CMC joint and dorsally at wrist joint ulnar side. FROM wrist and digits. Strength 5/5 with finger abduction, flexion, thumb opposition. Sensation diminished in palm but not in digits. Tinels negative, + phalens.    Assessment & Plan:  1. Right wrist/hand pain - consistent with developing carpal tunnel syndrome but with underlying DJD at wrist and 1st CMC.  Reassured.  Start with cockup wrist splint at bedtime and as much as possible during day.  Relative rest, icing as needed (take care around median nerve though - discussed).  Nsaids, tylenol for pain and inflammation.  F/u in  1 month to 6 weeks.  If not improving consider injection vs nerve conduction study.  See instructions for further.

## 2012-03-28 ENCOUNTER — Emergency Department (HOSPITAL_BASED_OUTPATIENT_CLINIC_OR_DEPARTMENT_OTHER): Payer: BC Managed Care – PPO

## 2012-03-28 ENCOUNTER — Encounter (HOSPITAL_BASED_OUTPATIENT_CLINIC_OR_DEPARTMENT_OTHER): Payer: Self-pay | Admitting: *Deleted

## 2012-03-28 ENCOUNTER — Emergency Department (HOSPITAL_BASED_OUTPATIENT_CLINIC_OR_DEPARTMENT_OTHER)
Admission: EM | Admit: 2012-03-28 | Discharge: 2012-03-28 | Disposition: A | Discharge status: Home / Self Care | Payer: BC Managed Care – PPO | Attending: Emergency Medicine | Admitting: Emergency Medicine

## 2012-03-28 DIAGNOSIS — T63441A Toxic effect of venom of bees, accidental (unintentional), initial encounter: Secondary | ICD-10-CM

## 2012-03-28 DIAGNOSIS — N400 Enlarged prostate without lower urinary tract symptoms: Secondary | ICD-10-CM | POA: Insufficient documentation

## 2012-03-28 DIAGNOSIS — Z8489 Family history of other specified conditions: Secondary | ICD-10-CM | POA: Insufficient documentation

## 2012-03-28 DIAGNOSIS — T63461A Toxic effect of venom of wasps, accidental (unintentional), initial encounter: Secondary | ICD-10-CM | POA: Insufficient documentation

## 2012-03-28 DIAGNOSIS — E785 Hyperlipidemia, unspecified: Secondary | ICD-10-CM | POA: Insufficient documentation

## 2012-03-28 DIAGNOSIS — Z809 Family history of malignant neoplasm, unspecified: Secondary | ICD-10-CM | POA: Insufficient documentation

## 2012-03-28 DIAGNOSIS — H902 Conductive hearing loss, unspecified: Secondary | ICD-10-CM | POA: Insufficient documentation

## 2012-03-28 DIAGNOSIS — T6391XA Toxic effect of contact with unspecified venomous animal, accidental (unintentional), initial encounter: Secondary | ICD-10-CM | POA: Insufficient documentation

## 2012-03-28 DIAGNOSIS — Z833 Family history of diabetes mellitus: Secondary | ICD-10-CM | POA: Insufficient documentation

## 2012-03-28 DIAGNOSIS — Z7982 Long term (current) use of aspirin: Secondary | ICD-10-CM | POA: Insufficient documentation

## 2012-03-28 DIAGNOSIS — M25539 Pain in unspecified wrist: Secondary | ICD-10-CM | POA: Insufficient documentation

## 2012-03-28 DIAGNOSIS — Z8249 Family history of ischemic heart disease and other diseases of the circulatory system: Secondary | ICD-10-CM | POA: Insufficient documentation

## 2012-03-28 DIAGNOSIS — M25531 Pain in right wrist: Secondary | ICD-10-CM

## 2012-03-28 MED ORDER — DIPHENHYDRAMINE HCL 25 MG PO CAPS
50.0000 mg | ORAL_CAPSULE | Freq: Once | ORAL | Status: AC
  Administered 2012-03-28: 50 mg via ORAL
  Filled 2012-03-28: qty 2

## 2012-03-28 MED ORDER — OXYCODONE-ACETAMINOPHEN 5-325 MG PO TABS
1.0000 | ORAL_TABLET | Freq: Once | ORAL | Status: AC
  Administered 2012-03-28: 1 via ORAL
  Filled 2012-03-28 (×2): qty 1

## 2012-03-28 MED ORDER — PREDNISONE 50 MG PO TABS
60.0000 mg | ORAL_TABLET | Freq: Once | ORAL | Status: AC
  Administered 2012-03-28: 60 mg via ORAL
  Filled 2012-03-28: qty 1

## 2012-03-28 MED ORDER — PREDNISONE 20 MG PO TABS: 40.0000 mg | ORAL_TABLET | Freq: Every day | ORAL | Status: AC

## 2012-03-28 NOTE — ED Provider Notes (Signed)
History  This chart was scribed for [name], MD by Bennett Scrape. This patient was seen in room MH08/MH08 and the patient's care was started at 6:39PM.  CSN: [id number]  Arrival date & time 03/28/12  1751   First MD Initiated Contact with Patient 03/28/12 1839      Chief Complaint  Patient presents with  . Insect Bite    The history is provided by the patient. No language interpreter was used.    Jason Hart is a 66 y.o. male who presents to the Emergency Department complaining of approximately 35 yellow jacket stings while trying to put a sign up in front of his office 30 minutes PTA. He also c/o right wrist pain after falling on it while trying to run away. He denies taking any Benadryl PTA and denies having any known allergies to bees. He denies SOB, CP, nausea, emesis, rash, trouble swallowing and facial swelling as associated symptoms. He has a h/o HLD and BPH. He is an occasional alcohol user but denies smoking.   Past Medical History  Diagnosis Date  . Trigger point with temporomandibular joint pain   . BPH (benign prostatic hypertrophy)   . Conductive hearing loss   . Hyperlipidemia     Past Surgical History  Procedure Date  . Tonsillectomy   . Colonoscopy     Negative;due 2016  . Tonsillectomy     Family History  Problem Relation Age of Onset  . Cancer Mother   . Hyperlipidemia Father   . Heart attack Father   . Sudden death Father   . Hypertension Neg Hx   . Diabetes Neg Hx     History  Substance Use Topics  . Smoking status: Never Smoker   . Smokeless tobacco: Never Used  . Alcohol Use: Yes     Couple times weekly-wine       Review of Systems  A complete 10 system review of systems was obtained and all systems are negative except as noted in the HPI and PMH.   Allergies  Review of patient's allergies indicates no known allergies.  Home Medications   Current Outpatient Rx  Name Route Sig Dispense Refill  . ASPIRIN 81 MG  PO TABS Oral Take 81 mg by mouth daily.      Marland Kitchen CLOBETASOL PROPIONATE 0.05 % EX SOLN      . PRAVASTATIN SODIUM 20 MG PO TABS  TAKE 1 TABLET AT BEDTIME. 90 tablet 3    No refills available  . TERBINAFINE HCL 250 MG PO TABS        Triage Vitals: BP 130/81  Pulse 92  Temp 97.8 F (36.6 C) (Oral)  Resp 22  SpO2 99%  Physical Exam  Nursing note and vitals reviewed. Constitutional: He is oriented to person, place, and time. He appears well-developed and well-nourished. No distress.  HENT:  Head: Normocephalic and atraumatic.       No uvular edema  Eyes: EOM are normal.  Neck: Neck supple. No tracheal deviation present.  Cardiovascular: Normal rate.   Pulmonary/Chest: Effort normal and breath sounds normal. No respiratory distress. He has no wheezes.  Musculoskeletal: Normal range of motion.  Neurological: He is alert and oriented to person, place, and time.  Skin: Skin is warm and dry.       Diffuse lesions from bee stings over bilateral upper and lower extremities, back, head and face, lesions are raised macular and papillar with surrounding erythema and warmth, bee sting over the dosrum of  the right wrist with pain with ROM, normal sensation, good capillary refill in fingers  Psychiatric: He has a normal mood and affect. His behavior is normal.    ED Course  Procedures (including critical care time)  DIAGNOSTIC STUDIES: Oxygen Saturation is 99% on room air, normal by my interpretation.    COORDINATION OF CARE: 6:41PM-Discussed treatment plan which includes benadryl, steriods and a right wrist x-ray with pt at bedside and pt agreed to plan.   Labs Reviewed - No data to display Dg Wrist Complete Right  03/28/2012  *RADIOLOGY REPORT*  Clinical Data: Fall  RIGHT WRIST - COMPLETE 3+ VIEW  Comparison: None.  Findings: Negative for fracture.  Mild irregularity ulnar styloid may be due to degenerative change.  Joint spaces are maintained without significant cartilage loss or spurring.   No erosion.  IMPRESSION: Negative for fracture.   Original Report Authenticated By: Camelia Phenes, M.D.      No diagnosis found.    MDM   Patient with multiple yellow jacket stings today. He is not allergic to bee stings but once standing is over the dorsal wrist on the right side with intense pain. He states he did fall while running away from the bees but his x-rays within the limits. The pain improved with ice. The patient was given Benadryl and prednisone due to this year number of bites to prevent further allergic response. Patient was discharged home with prednisone.  I personally performed the services described in this documentation, which was scribed in my presence.  The recorded information has been reviewed and considered.      [name], MD 03/28/12 1924

## 2012-03-28 NOTE — ED Notes (Signed)
Bee stings. Was putting a sign in front of his office and was stung by 35 bees. No respiratory distress.

## 2012-03-31 ENCOUNTER — Ambulatory Visit (INDEPENDENT_AMBULATORY_CARE_PROVIDER_SITE_OTHER): Payer: BC Managed Care – PPO | Admitting: Family Medicine

## 2012-03-31 ENCOUNTER — Encounter: Payer: Self-pay | Admitting: Family Medicine

## 2012-03-31 VITALS — BP 138/83 | HR 76 | Ht 67.0 in | Wt 170.0 lb

## 2012-03-31 DIAGNOSIS — M25531 Pain in right wrist: Secondary | ICD-10-CM

## 2012-03-31 DIAGNOSIS — M25539 Pain in unspecified wrist: Secondary | ICD-10-CM

## 2012-03-31 NOTE — Assessment & Plan Note (Signed)
presentation is unusual as he describes symptoms and has exam findings of both carpal tunnel and 1st CMC DJD.  Letter is predominant currently but not bad enough that patient would consider injection.  Declined NCVs to assess level of carpal tunnel as well - he would like to continue with brace, ibuprofen, rest for now.  Will call if he has persistent symptoms despite additional rest.

## 2012-03-31 NOTE — Progress Notes (Signed)
Subjective:    Patient ID: Jason Hart, male    DOB: 1946/01/25, 66 y.o.   MRN: [id number]  PCP: Dr. Alwyn Ren  Hand Pain    66 yo M here for f/u left wrist/hand pain.  7/1: Patient denies known injury. States for past 5 months has had pain in bilateral forefoot area in mornings and worse at end of day. Worse with prolonged standing. No swelling or bruising. Plays golf - worse with swinging when on forefoot area. Tried different shoes, advil, online orthotics.  7/23: Patient reports for past 2 months has had worsening pain/tingling in palm of left hand, wrist pain. Worse after playing golf. Tried ibuprofen, icing. Knows he has a little arthritis in these areas. Does not go into fingers. No right hand symptoms. Is right handed.  8/22: Patient reports he has had some improvement since last visit. Has played some golf since last visit also. No new injuries though he did come into contact with a hornets nest and was stung multiple times. Went to ED as a result but overall doing well now. Plans to take 2-3 months off after the next couple golf tournaments. Some numbness into palm of right hand. Soreness extends up to forearm. Most of pain is at base of thumb now and volar wrist. Taking ibuprofen which helps.  Past Medical History  Diagnosis Date  . Trigger point with temporomandibular joint pain   . BPH (benign prostatic hypertrophy)   . Conductive hearing loss   . Hyperlipidemia     Current Outpatient Prescriptions on File Prior to Visit  Medication Sig Dispense Refill  . aspirin 81 MG tablet Take 81 mg by mouth daily.        . clobetasol (TEMOVATE) 0.05 % external solution       . pravastatin (PRAVACHOL) 20 MG tablet TAKE 1 TABLET AT BEDTIME.  90 tablet  3  . predniSONE (DELTASONE) 20 MG tablet Take 2 tablets (40 mg total) by mouth daily.  10 tablet  0  . terbinafine (LAMISIL) 250 MG tablet         Past Surgical History  Procedure Date  . Tonsillectomy   .  Colonoscopy     Negative;due 2016  . Tonsillectomy     No Known Allergies  History   Social History  . Marital Status: Married    Spouse Name: N/A    Number of Children: N/A  . Years of Education: N/A   Occupational History  . SALES    Social History Main Topics  . Smoking status: Never Smoker   . Smokeless tobacco: Never Used  . Alcohol Use: Yes     Couple times weekly-wine   . Drug Use: No  . Sexually Active: Not on file   Other Topics Concern  . Not on file   Social History Narrative   REG EXERCISE    Family History  Problem Relation Age of Onset  . Cancer Mother   . Hyperlipidemia Father   . Heart attack Father   . Sudden death Father   . Hypertension Neg Hx   . Diabetes Neg Hx     BP 138/83  Pulse 76  Ht 5\' 7"  (1.702 m)  Wt 170 lb (77.111 kg)  BMI 26.63 kg/m2  Review of Systems  See HPI above.    Objective:   Physical Exam  Gen: NAD  R wrist/hand: No gross deformity, swelling, bruising, atrophy. TTP base of 1st CMC joint, less at volar wrist over median nerve.  FROM wrist and digits. Strength 5/5 with finger abduction, flexion, thumb opposition. Sensation diminished in palm but not in digits. Tinels negative    Assessment & Plan:  1. Right wrist/hand pain - presentation is unusual as he describes symptoms and has exam findings of both carpal tunnel and 1st CMC DJD.  Letter is predominant currently but not bad enough that patient would consider injection.  Declined NCVs to assess level of carpal tunnel as well - he would like to continue with brace, ibuprofen, rest for now.  Will call if he has persistent symptoms despite additional rest.

## 2012-04-01 ENCOUNTER — Ambulatory Visit: Payer: BC Managed Care – PPO | Admitting: Family Medicine

## 2012-10-20 ENCOUNTER — Other Ambulatory Visit: Payer: Self-pay | Admitting: Internal Medicine

## 2012-10-20 NOTE — Telephone Encounter (Signed)
Lipid/Hep 272.4/995.20  

## 2013-01-11 ENCOUNTER — Other Ambulatory Visit: Payer: Self-pay | Admitting: Internal Medicine

## 2013-02-14 ENCOUNTER — Ambulatory Visit (INDEPENDENT_AMBULATORY_CARE_PROVIDER_SITE_OTHER): Payer: BC Managed Care – PPO | Admitting: Internal Medicine

## 2013-02-14 ENCOUNTER — Encounter: Payer: Self-pay | Admitting: Internal Medicine

## 2013-02-14 VITALS — BP 126/78 | HR 69 | Temp 97.6°F | Resp 12 | Ht 67.08 in | Wt 170.0 lb

## 2013-02-14 DIAGNOSIS — E785 Hyperlipidemia, unspecified: Secondary | ICD-10-CM

## 2013-02-14 DIAGNOSIS — Z23 Encounter for immunization: Secondary | ICD-10-CM

## 2013-02-14 DIAGNOSIS — Z0000 Encounter for general adult medical examination without abnormal findings: Secondary | ICD-10-CM

## 2013-02-14 DIAGNOSIS — L4050 Arthropathic psoriasis, unspecified: Secondary | ICD-10-CM | POA: Insufficient documentation

## 2013-02-14 MED ORDER — PRAVASTATIN SODIUM 20 MG PO TABS: 20.0000 mg | ORAL_TABLET | Freq: Every day | ORAL | Status: DC

## 2013-02-14 NOTE — Patient Instructions (Addendum)

## 2013-02-14 NOTE — Progress Notes (Signed)
  Subjective:    Patient ID: Jason Hart, male    DOB: 1946-04-18, 67 y.o.   MRN: [id number]  HPI   He is here for a physical;acute issues include Psoriatic arthritis for which he sees Dr Dareen Piano     Review of Systems He is on a heart healthy diet; he exercises 60 minutes 2-3 times per week without symptoms. Specifically he denies chest pain, palpitations, dyspnea, or claudication. Family history is positive for premature coronary disease in his father @ 48. Advanced cholesterol testing reveals his LDL goal was less than 115.     Objective:   Physical Exam Gen.: well-nourished in appearance. Alert, appropriate and cooperative throughout exam. Head: Normocephalic without obvious abnormalities;  Pattern alopecia  Eyes: No corneal or conjunctival inflammation noted. Pupils equal round reactive to light and accommodation.  Extraocular motion intact. Vision grossly normal with lenses Ears: External  ear exam reveals no significant lesions or deformities. Canals clear .TMs normal. Hearing is grossly normal bilaterally. Nose: External nasal exam reveals no deformity or inflammation. Nasal mucosa are pink and moist. No lesions or exudates noted.   Mouth: Oral mucosa and oropharynx reveal no lesions or exudates. Teeth in good repair. Neck: No deformities, masses, or tenderness noted. Range of motion decreased. Thyroid normal. Lungs: Normal respiratory effort; chest expands symmetrically. Lungs are clear to auscultation without rales, wheezes, or increased work of breathing. Heart: Normal rate and rhythm. Normal S1 and S2. No gallop, click, or rub. S4 w/o murmur. Abdomen: Bowel sounds normal; abdomen soft and nontender. No masses, organomegaly or hernias noted. Genitalia: Genitalia normal except for right granuloma & left varices. Prostate is asymmetrically enlarged w/o nodularity or induration.                                Musculoskeletal/extremities: No deformity or scoliosis noted of  the  thoracic or lumbar spine.  No clubbing, cyanosis, edema, or significant extremity  deformity noted. Range of motion normal .Tone & strength  Normal. Joints normal . Nails reveal ridging & isolated onycholysis. Able to lie down & sit up w/o help. Negative SLR bilaterally Vascular: Carotid, radial artery, dorsalis pedis and  posterior tibial pulses are full and equal. No bruits present. Neurologic: Alert and oriented x3. Deep tendon reflexes symmetrical and normal.         Skin: Intact without suspicious lesion. Intergluteal rash. Lymph: No cervical, axillary, or inguinal lymphadenopathy present. Psych: Mood and affect are normal. Normally interactive                                                                                        Assessment & Plan:  #1 comprehensive physical exam; no acute findings  Plan: see Orders  & Recommendations

## 2013-02-15 ENCOUNTER — Other Ambulatory Visit (INDEPENDENT_AMBULATORY_CARE_PROVIDER_SITE_OTHER): Payer: BC Managed Care – PPO

## 2013-02-15 DIAGNOSIS — Z0000 Encounter for general adult medical examination without abnormal findings: Secondary | ICD-10-CM

## 2013-02-15 LAB — HEPATIC FUNCTION PANEL
AST: 24 U/L (ref 0–37)
Albumin: 3.9 g/dL (ref 3.5–5.2)
Alkaline Phosphatase: 74 U/L (ref 39–117)
Bilirubin, Direct: 0.1 mg/dL (ref 0.0–0.3)

## 2013-02-15 LAB — CBC WITH DIFFERENTIAL/PLATELET
Basophils Absolute: 0 10*3/uL (ref 0.0–0.1)
Basophils Relative: 0.5 % (ref 0.0–3.0)
Eosinophils Absolute: 0.1 10*3/uL (ref 0.0–0.7)
Hemoglobin: 15.3 g/dL (ref 13.0–17.0)
Lymphocytes Relative: 27.4 % (ref 12.0–46.0)
MCHC: 34.1 g/dL (ref 30.0–36.0)
Monocytes Relative: 7.6 % (ref 3.0–12.0)
Neutro Abs: 5.5 10*3/uL (ref 1.4–7.7)
Neutrophils Relative %: 63.4 % (ref 43.0–77.0)
RBC: 4.82 Mil/uL (ref 4.22–5.81)
RDW: 12.9 % (ref 11.5–14.6)

## 2013-02-15 LAB — LIPID PANEL
HDL: 39.9 mg/dL (ref >39.00)
VLDL: 21.4 mg/dL (ref 0.0–40.0)

## 2013-02-15 LAB — BASIC METABOLIC PANEL
CO2: 33 mEq/L — ABNORMAL HIGH (ref 19–32)
Calcium: 9.6 mg/dL (ref 8.4–10.5)
Creatinine, Ser: 1 mg/dL (ref 0.4–1.5)
GFR: 79.27 mL/min (ref >60.00)
Sodium: 140 mEq/L (ref 135–145)

## 2013-02-20 ENCOUNTER — Other Ambulatory Visit: Payer: Self-pay | Admitting: Internal Medicine

## 2013-03-15 ENCOUNTER — Other Ambulatory Visit: Payer: Self-pay

## 2013-06-15 ENCOUNTER — Other Ambulatory Visit: Payer: Self-pay

## 2014-02-15 ENCOUNTER — Encounter: Payer: Self-pay | Admitting: Internal Medicine

## 2014-02-15 ENCOUNTER — Ambulatory Visit (INDEPENDENT_AMBULATORY_CARE_PROVIDER_SITE_OTHER): Payer: BC Managed Care – PPO | Admitting: Internal Medicine

## 2014-02-15 VITALS — BP 144/96 | HR 69 | Temp 97.9°F | Ht 67.0 in | Wt 176.4 lb

## 2014-02-15 DIAGNOSIS — R030 Elevated blood-pressure reading, without diagnosis of hypertension: Secondary | ICD-10-CM

## 2014-02-15 DIAGNOSIS — E785 Hyperlipidemia, unspecified: Secondary | ICD-10-CM

## 2014-02-15 DIAGNOSIS — L4050 Arthropathic psoriasis, unspecified: Secondary | ICD-10-CM

## 2014-02-15 DIAGNOSIS — N429 Disorder of prostate, unspecified: Secondary | ICD-10-CM

## 2014-02-15 NOTE — Assessment & Plan Note (Signed)
PSA

## 2014-02-15 NOTE — Progress Notes (Signed)
Pre visit review using our clinic review tool, if applicable. No additional management support is needed unless otherwise documented below in the visit note. 

## 2014-02-15 NOTE — Patient Instructions (Signed)

## 2014-02-15 NOTE — Assessment & Plan Note (Signed)
Blood pressure goals reviewed. BMET 

## 2014-02-15 NOTE — Assessment & Plan Note (Signed)
Lipids, LFTs, TSH  

## 2014-02-15 NOTE — Progress Notes (Signed)
   Subjective:    Patient ID: Jason Hart, male    DOB: [birth year], 68 y.o.   MRN: [id number]  HPI He is here to assess active health issues & conditions. PMH, FH, & Social history verified & updated    A heart healthy diet is followed; exercise encompasses 30-45 minutes 3  times per week as stationary bike, golf, & walking without symptoms.   Family history is + for premature coronary disease. Advanced cholesterol testing reveals  LDL goal is less than 115 ; ideally < 85. There is medication compliance with the statin.  Low dose ASA not taken. No BP monitor @ home   Review of Systems    Specifically denied are  chest pain, palpitations, dyspnea, or claudication.  Significant abdominal symptoms, memory deficit, or myalgias not present.  He denies LUTS, dysuria, pyuria, hematuria, frequency, nocturia or polyuria.        Objective:   Physical Exam Gen.: Healthy and well-nourished in appearance. Alert, appropriate and cooperative throughout exam. Appears younger than stated age  Head: Normocephalic without obvious abnormalities; pattern alopecia  Eyes: No corneal or conjunctival inflammation noted. Pupils equal round reactive to light and accommodation. Extraocular motion intact.  Ears: External  ear exam reveals no significant lesions or deformities. Canals clear .TMs normal. Hearing is grossly normal bilaterally. Nose: External nasal exam reveals no deformity or inflammation. Nasal mucosa are pink and moist. No lesions or exudates noted.   Mouth: Oral mucosa and oropharynx reveal no lesions or exudates. Teeth in good repair. Neck: No deformities, masses, or tenderness noted. Range of motion decreased. Thyroid  normal. Lungs: Normal respiratory effort; chest expands symmetrically. Lungs are clear to auscultation without rales, wheezes, or increased work of breathing. Heart: Normal rate and rhythm. Accentuated S1 ;normal S2. No gallop, click, or rub. No murmur. Abdomen: Bowel  sounds normal; abdomen soft and nontender. No masses, organomegaly or hernias noted. Genitalia: Genitalia normal except for left varices. Prostate is enlarged on L w/o  nodularity or induration                             Musculoskeletal/extremities: No deformity or scoliosis noted of  the thoracic or lumbar spine.  No clubbing, cyanosis, edema, or significant extremity  deformity noted. Range of motion normal .Tone & strength normal. Hand joints normal Fingernail deformities. Able to lie down & sit up w/o help. Negative SLR bilaterally Vascular: Carotid, radial artery, dorsalis pedis and  posterior tibial pulses are full and equal. No bruits present. Neurologic: Alert and oriented x3. Deep tendon reflexes symmetrical and normal.  Gait normal .     Skin: Truncal psoriatic lesions Lymph: No cervical, axillary, or inguinal lymphadenopathy present. Psych: Mood and affect are normal. Normally interactive                                                                                        Assessment & Plan:  See Current Assessment & Plan in Problem List under specific Diagnosis

## 2014-02-19 ENCOUNTER — Other Ambulatory Visit (INDEPENDENT_AMBULATORY_CARE_PROVIDER_SITE_OTHER): Payer: BC Managed Care – PPO

## 2014-02-19 DIAGNOSIS — N429 Disorder of prostate, unspecified: Secondary | ICD-10-CM

## 2014-02-19 DIAGNOSIS — R030 Elevated blood-pressure reading, without diagnosis of hypertension: Secondary | ICD-10-CM

## 2014-02-19 DIAGNOSIS — E785 Hyperlipidemia, unspecified: Secondary | ICD-10-CM

## 2014-02-19 LAB — BASIC METABOLIC PANEL WITH GFR
BUN: 27 mg/dL — ABNORMAL HIGH (ref 6–23)
CO2: 30 meq/L (ref 19–32)
Calcium: 9.1 mg/dL (ref 8.4–10.5)
Chloride: 109 meq/L (ref 96–112)
Creatinine, Ser: 0.9 mg/dL (ref 0.4–1.5)
GFR: 85.93 mL/min
Glucose, Bld: 90 mg/dL (ref 70–99)
Potassium: 4.2 meq/L (ref 3.5–5.1)
Sodium: 142 meq/L (ref 135–145)

## 2014-02-19 LAB — HEPATIC FUNCTION PANEL
ALT: 14 U/L (ref 0–53)
AST: 22 U/L (ref 0–37)
Albumin: 3.8 g/dL (ref 3.5–5.2)
Alkaline Phosphatase: 72 U/L (ref 39–117)
BILIRUBIN DIRECT: 0.2 mg/dL (ref 0.0–0.3)
BILIRUBIN TOTAL: 0.7 mg/dL (ref 0.2–1.2)
Total Protein: 6.5 g/dL (ref 6.0–8.3)

## 2014-02-19 LAB — LIPID PANEL
CHOL/HDL RATIO: 3
CHOLESTEROL: 141 mg/dL (ref 0–200)
HDL: 45.4 mg/dL (ref >39.00)
LDL CALC: 80 mg/dL (ref 0–99)
NonHDL: 95.6
TRIGLYCERIDES: 77 mg/dL (ref 0.0–149.0)
VLDL: 15.4 mg/dL (ref 0.0–40.0)

## 2014-02-19 LAB — PSA: PSA: 1.05 ng/mL (ref 0.10–4.00)

## 2014-02-19 LAB — TSH: TSH: 1.67 u[IU]/mL (ref 0.35–4.50)

## 2014-04-23 ENCOUNTER — Other Ambulatory Visit: Payer: Self-pay | Admitting: Internal Medicine

## 2014-12-07 ENCOUNTER — Encounter: Payer: Self-pay | Admitting: Gastroenterology

## 2015-01-10 ENCOUNTER — Encounter: Payer: Self-pay | Admitting: Gastroenterology

## 2015-02-13 ENCOUNTER — Ambulatory Visit (INDEPENDENT_AMBULATORY_CARE_PROVIDER_SITE_OTHER): Payer: BLUE CROSS/BLUE SHIELD | Admitting: Internal Medicine

## 2015-02-13 ENCOUNTER — Encounter: Payer: Self-pay | Admitting: Internal Medicine

## 2015-02-13 ENCOUNTER — Other Ambulatory Visit: Payer: Self-pay | Admitting: Internal Medicine

## 2015-02-13 ENCOUNTER — Other Ambulatory Visit (INDEPENDENT_AMBULATORY_CARE_PROVIDER_SITE_OTHER): Payer: BLUE CROSS/BLUE SHIELD

## 2015-02-13 VITALS — BP 138/82 | HR 70 | Temp 97.5°F | Resp 16 | Ht 67.0 in | Wt 172.0 lb

## 2015-02-13 DIAGNOSIS — N401 Enlarged prostate with lower urinary tract symptoms: Secondary | ICD-10-CM

## 2015-02-13 DIAGNOSIS — R3916 Straining to void: Secondary | ICD-10-CM | POA: Diagnosis not present

## 2015-02-13 DIAGNOSIS — L4050 Arthropathic psoriasis, unspecified: Secondary | ICD-10-CM

## 2015-02-13 DIAGNOSIS — E785 Hyperlipidemia, unspecified: Secondary | ICD-10-CM | POA: Diagnosis not present

## 2015-02-13 LAB — PSA: PSA: 1.01 ng/mL (ref 0.10–4.00)

## 2015-02-13 LAB — TSH: TSH: 1.02 u[IU]/mL (ref 0.35–4.50)

## 2015-02-13 MED ORDER — PRAVASTATIN SODIUM 20 MG PO TABS: ORAL_TABLET | ORAL | Status: DC

## 2015-02-13 NOTE — Assessment & Plan Note (Signed)
PSA

## 2015-02-13 NOTE — Progress Notes (Signed)
Subjective:    Patient ID: Jason Hart, male    DOB: 04/08/1946, 69 y.o.   MRN: [id number]  HPI The patient is here to assess status of active health conditions.  PMH, FH, & Social History reviewed & updated.  He has been on methotrexate for 3 months for psoriatic arthritis. He's been back on his statin for one month.   He is on a heart healthy, low-salt diet. He exercises 4 times a week on a bike, walking, playing golf without cardiopulmonary symptoms.  Other than nocturia typically once a night and intermittent difficulty starting stream he has no active GU symptoms. His brother did have prostatic cancer. He has a history of prostate enlargement  He does not drink alcohol while on methotrexate. He's never smoked.  He has a pending colonoscopy; he has no active GI symptoms.    Review of Systems  Chest pain, palpitations, tachycardia, exertional dyspnea, paroxysmal nocturnal dyspnea, claudication or edema are absent. No unexplained weight loss, abdominal pain, significant dyspepsia, dysphagia, melena, rectal bleeding, or persistently small caliber stools. Dysuria, pyuria, hematuria, frequency, or polyuria are denied. Change in hair, skin, nails denied. No bowel changes of constipation or diarrhea. No intolerance to heat or cold.    Objective:   Physical Exam  Gen.: Adequately nourished in appearance. Alert, appropriate and cooperative throughout exam.  Head: Normocephalic without obvious abnormalities; pattern alopecia  Eyes: No corneal or conjunctival inflammation noted. Pupils equal round reactive to light and accommodation. Extraocular motion intact.  Ears: External  ear exam reveals no significant lesions or deformities. Canals clear .TMs normal. Hearing is grossly normal bilaterally. Nose: External nasal exam reveals no deformity or inflammation. Nasal mucosa are pink and moist. No lesions or exudates noted.   Mouth: Oral mucosa and oropharynx reveal no lesions or  exudates. Teeth in good repair. Neck: No deformities, masses, or tenderness noted. Range of motion & Thyroid normal. Lungs: Normal respiratory effort; chest expands symmetrically. Lungs are clear to auscultation without rales, wheezes, or increased work of breathing. Heart: Normal rate and rhythm. Normal S1 and S2. No gallop, click, or rub. No murmur. Abdomen: Bowel sounds normal; abdomen soft and nontender. No masses, organomegaly or hernias noted. Genitalia: Genitalia normal except for left varices. Prostate is asymmetrically enlarged w/o nodularity or induration   Musculoskeletal/extremities: No deformity or scoliosis noted of  the thoracic or lumbar spine.  No clubbing, cyanosis, edema, or significant extremity  deformity noted.  Range of motion normal . Tone & strength normal. Hand joints normal Fingernail  health good.Minor pitting. Crepitus of knees  Able to lie down & sit up w/o help.  Negative SLR bilaterally Vascular: Carotid, radial artery, dorsalis pedis and  posterior tibial pulses are full and equal. No bruits present. Neurologic: Alert and oriented x3. Deep tendon reflexes symmetrical and normal.  Gait normal     Skin: Intact without suspicious lesions or rashes. Lymph: No cervical, axillary, or inguinal lymphadenopathy present. Psych: Mood and affect are normal. Normally interactive                                                                                      Assessment &  Plan:  See Current Assessment & Plan in Problem List under specific Diagnosis

## 2015-02-13 NOTE — Progress Notes (Signed)
Pre visit review using our clinic review tool, if applicable. No additional management support is needed unless otherwise documented below in the visit note. 

## 2015-02-13 NOTE — Assessment & Plan Note (Signed)
NMR TSH

## 2015-02-13 NOTE — Patient Instructions (Signed)
  Your next office appointment will be determined based upon review of your pending labs.  Those written interpretation of the lab results and instructions will be transmitted to you by My Chart  Critical results will be called.   Followup as needed for any active or acute issue. Please report any significant change in your symptoms.  Share results with all non [facility] medical staff seen

## 2015-02-15 LAB — NMR LIPOPROFILE WITH LIPIDS
CHOLESTEROL, TOTAL: 150 mg/dL (ref 100–199)
HDL PARTICLE NUMBER: 30.4 umol/L — AB (ref >=30.5)
HDL Size: 8.5 nm — ABNORMAL LOW (ref >=9.2)
HDL-C: 46 mg/dL (ref >39)
LDL CALC: 84 mg/dL (ref 0–99)
LDL Particle Number: 1026 nmol/L — ABNORMAL HIGH (ref <1000)
LDL Size: 20.9 nm (ref >=20.8)
LP-IR SCORE: 46 — AB (ref <=45)
Large HDL-P: 2.9 umol/L — ABNORMAL LOW (ref >=4.8)
Large VLDL-P: <0.8 nmol/L (ref <=2.7)
Small LDL Particle Number: 459 nmol/L (ref <=527)
Triglycerides: 100 mg/dL (ref 0–149)
VLDL Size: 38.9 nm (ref <=46.6)

## 2015-03-04 ENCOUNTER — Encounter: Payer: Self-pay | Admitting: Internal Medicine

## 2015-03-04 DIAGNOSIS — E785 Hyperlipidemia, unspecified: Secondary | ICD-10-CM

## 2015-03-05 ENCOUNTER — Ambulatory Visit (AMBULATORY_SURGERY_CENTER): Payer: Self-pay | Admitting: *Deleted

## 2015-03-05 VITALS — Ht 67.0 in | Wt 171.0 lb

## 2015-03-05 DIAGNOSIS — Z1211 Encounter for screening for malignant neoplasm of colon: Secondary | ICD-10-CM

## 2015-03-05 MED ORDER — PRAVASTATIN SODIUM 20 MG PO TABS: 20.0000 mg | ORAL_TABLET | Freq: Every day | ORAL | Status: DC

## 2015-03-05 MED ORDER — FOLIC ACID 1 MG PO TABS: 1.0000 mg | ORAL_TABLET | Freq: Every day | ORAL | Status: AC

## 2015-03-05 NOTE — Progress Notes (Signed)
No egg or soy allergy No issues with past sedation No diet pills No home 02 emmi video declined

## 2015-03-11 ENCOUNTER — Encounter: Payer: Self-pay | Admitting: Gastroenterology

## 2015-03-11 ENCOUNTER — Ambulatory Visit (AMBULATORY_SURGERY_CENTER): Payer: BLUE CROSS/BLUE SHIELD | Admitting: Gastroenterology

## 2015-03-11 VITALS — BP 140/75 | HR 59 | Temp 96.3°F | Resp 20

## 2015-03-11 DIAGNOSIS — Z1211 Encounter for screening for malignant neoplasm of colon: Secondary | ICD-10-CM | POA: Diagnosis present

## 2015-03-11 MED ORDER — SODIUM CHLORIDE 0.9 % IV SOLN
500.0000 mL | INTRAVENOUS | Status: DC
Start: 2015-03-11 — End: 2015-03-12

## 2015-03-11 NOTE — Progress Notes (Signed)
To recovery, Report to Doyle Askew, RN, VSS.

## 2015-03-11 NOTE — Op Note (Signed)
[place]  [address] [place], [id number]   COLONOSCOPY PROCEDURE REPORT  PATIENT: Jason, Hart  MR#: [id number] BIRTHDATE: [birth year] , 68  yrs. old GENDER: male ENDOSCOPIST: Inda Castle, MD REFERRED OV:ZCHYIFO Linna Darner, M.D. PROCEDURE DATE:  03/11/2015 PROCEDURE:   Colonoscopy, screening First Screening Colonoscopy - Avg.  risk and is 50 yrs.  old or older - No.  Prior Negative Screening - Now for repeat screening. 10 or more years since last screening  History of Adenoma - Now for follow-up colonoscopy & has been > or = to 3 yrs.  N/A  Polyps removed today? No Recommend repeat exam, <10 yrs? No ASA CLASS:   Class II INDICATIONS:Colorectal Neoplasm Risk Assessment for this procedure is average risk. MEDICATIONS: Monitored anesthesia care and Propofol 250 mg IV  DESCRIPTION OF PROCEDURE:   After the risks benefits and alternatives of the procedure were thoroughly explained, informed consent was obtained.  The digital rectal exam revealed no abnormalities of the rectum.   The LB [id number]  endoscope was introduced through the anus and advanced to the cecum, which was identified by both the appendix and ileocecal valve. No adverse events experienced.   The quality of the prep was (Suprep was used) good.  The instrument was then slowly withdrawn as the colon was fully examined. Estimated blood loss is zero unless otherwise noted in this procedure report.      COLON FINDINGS: A normal appearing cecum, ileocecal valve, and appendiceal orifice were identified.  The ascending, transverse, descending, sigmoid colon, and rectum appeared unremarkable. Retroflexed views revealed no abnormalities. The time to cecum = 8.0 Withdrawal time = 6:57   The scope was withdrawn and the procedure completed. COMPLICATIONS: There were no immediate complications.  ENDOSCOPIC IMPRESSION: Normal colonoscopy  RECOMMENDATIONS: Continue current colorectal  screening recommendations for "routine risk" patients with a repeat colonoscopy in 10 years.  eSigned:  Inda Castle, MD 03/11/2015 9:00 AM   cc:

## 2015-03-11 NOTE — Patient Instructions (Addendum)
YOU HAD AN ENDOSCOPIC PROCEDURE TODAY AT [facility]:   Refer to the procedure report that was given to you for any specific questions about what was found during the examination.  If the procedure report does not answer your questions, please call your gastroenterologist to clarify.  If you requested that your care partner not be given the details of your procedure findings, then the procedure report has been included in a sealed envelope for you to review at your convenience later.  YOU SHOULD EXPECT: Some feelings of bloating in the abdomen. Passage of more gas than usual.  Walking can help get rid of the air that was put into your GI tract during the procedure and reduce the bloating. If you had a lower endoscopy (such as a colonoscopy or flexible sigmoidoscopy) you may notice spotting of blood in your stool or on the toilet paper. If you underwent a bowel prep for your procedure, you may not have a normal bowel movement for a few days.  Please Note:  You might notice some irritation and congestion in your nose or some drainage.  This is from the oxygen used during your procedure.  There is no need for concern and it should clear up in a day or so.  SYMPTOMS TO REPORT IMMEDIATELY:   Following lower endoscopy (colonoscopy or flexible sigmoidoscopy):  Excessive amounts of blood in the stool  Significant tenderness or worsening of abdominal pains  Swelling of the abdomen that is new, acute  Fever of 100F or higher  For urgent or emergent issues, a gastroenterologist can be reached at any hour by calling [phone].   DIET: Your first meal following the procedure should be a small meal and then it is ok to progress to your normal diet. Heavy or fried foods are harder to digest and may make you feel nauseous or bloated.  Likewise, meals heavy in dairy and vegetables can increase bloating.  Drink plenty of fluids but you should avoid alcoholic beverages for 24  hours.  ACTIVITY:  You should plan to take it easy for the rest of today and you should NOT DRIVE or use heavy machinery until tomorrow (because of the sedation medicines used during the test).    FOLLOW UP: Our staff will call the number listed on your records the next business day following your procedure to check on you and address any questions or concerns that you may have regarding the information given to you following your procedure. If we do not reach you, we will leave a message.  However, if you are feeling well and you are not experiencing any problems, there is no need to return our call.  We will assume that you have returned to your regular daily activities without incident.  If any biopsies were taken you will be contacted by phone or by letter within the next 1-3 weeks.  Please call us at [phone] if you have not heard about the biopsies in 3 weeks.    SIGNATURES/CONFIDENTIALITY: You and/or your care partner have signed paperwork which will be entered into your electronic medical record.  These signatures attest to the fact that that the information above on your After Visit Summary has been reviewed and is understood.  Full responsibility of the confidentiality of this discharge information lies with you and/or your care-partner.  Next colonoscopy in 10 years.

## 2015-03-12 ENCOUNTER — Telehealth: Payer: Self-pay

## 2015-03-12 NOTE — Telephone Encounter (Signed)
  Follow up Call-  Call back number 03/11/2015  Post procedure Call Back phone  # [phone]  Permission to leave phone message Yes     Patient questions:  Do you have a fever, pain , or abdominal swelling? No. Pain Score  0 *  Have you tolerated food without any problems? Yes.    Have you been able to return to your normal activities? Yes.    Do you have any questions about your discharge instructions: Diet   No. Medications  No. Follow up visit  No.  Do you have questions or concerns about your Care? No.  Actions: * If pain score is 4 or above: No action needed, pain <4.

## 2015-08-13 ENCOUNTER — Encounter: Payer: Self-pay | Admitting: Internal Medicine

## 2015-08-27 ENCOUNTER — Encounter: Payer: Self-pay | Admitting: Internal Medicine

## 2015-09-09 ENCOUNTER — Encounter: Payer: Self-pay | Admitting: Family

## 2015-09-09 ENCOUNTER — Ambulatory Visit (INDEPENDENT_AMBULATORY_CARE_PROVIDER_SITE_OTHER): Payer: BLUE CROSS/BLUE SHIELD | Admitting: Family

## 2015-09-09 VITALS — BP 160/78 | HR 80 | Temp 97.6°F | Resp 16 | Ht 67.0 in | Wt 174.8 lb

## 2015-09-09 DIAGNOSIS — R059 Cough, unspecified: Secondary | ICD-10-CM | POA: Insufficient documentation

## 2015-09-09 DIAGNOSIS — Z23 Encounter for immunization: Secondary | ICD-10-CM

## 2015-09-09 DIAGNOSIS — R05 Cough: Secondary | ICD-10-CM | POA: Diagnosis not present

## 2015-09-09 NOTE — Progress Notes (Signed)
   Subjective:    Patient ID: Jason Hart, male    DOB: [birth year], 70 y.o.   MRN: [id number]  Chief Complaint  Patient presents with  . Establish Care    wants shingles vaccine, has had congestion and sxs of a cold for 5 weeks, has a heavy feeling in chest, and cough    HPI:  Jason Hart is a 70 y.o. male who  has a past medical history of Trigger point with temporomandibular joint pain; BPH (benign prostatic hypertrophy); Conductive hearing loss; Hyperlipidemia; Psoriatic arthritis ([place]); and Arthritis. and presents today for an acute office.   1.) Cough - This is a new problem. Associated symptoms of chest heaviness, cough and cold symptoms has been going on for about 5 weeks. Modifying factors include Delsym and aspirin. Over the course of the 5 weeks he has had improved symptoms. Denies fevers. No antibiotic use.   2.) Shingles vaccination - Would like the shingles vaccination.   No Known Allergies   Current Outpatient Prescriptions on File Prior to Visit  Medication Sig Dispense Refill  . folic acid (FOLVITE) 1 MG tablet Take 1 tablet (1 mg total) by mouth daily. 90 tablet 3  . methotrexate (RHEUMATREX) 2.5 MG tablet Take 2.5 mg by mouth once a week. Caution:Chemotherapy. Protect from light.    . pravastatin (PRAVACHOL) 20 MG tablet Take 1 tablet (20 mg total) by mouth daily. 90 tablet 3   No current facility-administered medications on file prior to visit.    Review of Systems  Constitutional: Negative for fever and chills.  HENT: Positive for congestion and sinus pressure.   Respiratory: Positive for cough. Negative for chest tightness and shortness of breath.   Cardiovascular: Negative for chest pain, palpitations and leg swelling.  Neurological: Positive for headaches.  Psychiatric/Behavioral: Negative for sleep disturbance.      Objective:    BP 160/78 mmHg  Pulse 80  Temp(Src) 97.6 F (36.4 C) (Oral)  Resp 16  Ht 5\' 7"  (1.702 m)  Wt 174 lb  12.8 oz (79.289 kg)  BMI 27.37 kg/m2  SpO2 96% Nursing note and vital signs reviewed.  Physical Exam  Constitutional: He is oriented to person, place, and time. He appears well-developed and well-nourished. No distress.  HENT:  Right Ear: Hearing, tympanic membrane, external ear and ear canal normal.  Left Ear: Hearing, tympanic membrane, external ear and ear canal normal.  Nose: Nose normal.  Mouth/Throat: Uvula is midline, oropharynx is clear and moist and mucous membranes are normal.  Cardiovascular: Normal rate, regular rhythm, normal heart sounds and intact distal pulses.   Pulmonary/Chest: Effort normal and breath sounds normal.  Neurological: He is alert and oriented to person, place, and time.  Skin: Skin is warm and dry.  Psychiatric: He has a normal mood and affect. His behavior is normal. Judgment and thought content normal.       Assessment & Plan:   Problem List Items Addressed This Visit      Other   Cough    Symptoms and consistent with resolving viral infection. Continue OTC medications as needed for symptom relief and supportive care. Follow up if symptoms worsen or do not continue to improve.        Other Visit Diagnoses    Need for shingles vaccine    -  Primary    Relevant Orders    Varicella-zoster vaccine subcutaneous (Completed)

## 2015-09-09 NOTE — Patient Instructions (Signed)
Thank you for choosing [facility].  Summary/Instructions:  If your symptoms worsen or fail to improve, please contact our office for further instruction, or in case of emergency go directly to the emergency room at the closest medical facility.   General Recommendations:    Please drink plenty of fluids.  Get plenty of rest   Sleep in humidified air  Use saline nasal sprays  Netti pot   OTC Medications:  Decongestants - helps relieve congestion   Flonase (generic fluticasone) or Nasacort (generic triamcinolone) - please make sure to use the "cross-over" technique at a 45 degree angle towards the opposite eye as opposed to straight up the nasal passageway.   Sudafed (generic pseudoephedrine - Note this is the one that is available behind the pharmacy counter); Products with phenylephrine (-PE) may also be used but is often not as effective as pseudoephedrine.   If you have HIGH BLOOD PRESSURE - Coricidin HBP; AVOID any product that is -D as this contains pseudoephedrine which may increase your blood pressure.  Afrin (oxymetazoline) every 6-8 hours for up to 3 days.   Allergies - helps relieve runny nose, itchy eyes and sneezing   Claritin (generic loratidine), Allegra (fexofenidine), or Zyrtec (generic cyrterizine) for runny nose. These medications should not cause drowsiness.  Note - Benadryl (generic diphenhydramine) may be used however may cause drowsiness  Cough -   Delsym or Robitussin (generic dextromethorphan)  Expectorants - helps loosen mucus to ease removal   Mucinex (generic guaifenesin) as directed on the package.  Headaches / General Aches   Tylenol (generic acetaminophen) - DO NOT EXCEED 3 grams (3,000 mg) in a 24 hour time period  Advil/Motrin (generic ibuprofen)   Sore Throat -   Salt water gargle   Chloraseptic (generic benzocaine) spray or lozenges / Sucrets (generic dyclonine)       

## 2015-09-09 NOTE — Assessment & Plan Note (Signed)
Symptoms and consistent with resolving viral infection. Continue OTC medications as needed for symptom relief and supportive care. Follow up if symptoms worsen or do not continue to improve.

## 2015-09-09 NOTE — Progress Notes (Signed)
Pre visit review using our clinic review tool, if applicable. No additional management support is needed unless otherwise documented below in the visit note. 

## 2015-10-21 ENCOUNTER — Ambulatory Visit (INDEPENDENT_AMBULATORY_CARE_PROVIDER_SITE_OTHER): Payer: BLUE CROSS/BLUE SHIELD | Admitting: Family

## 2015-10-21 ENCOUNTER — Encounter: Payer: Self-pay | Admitting: Family

## 2015-10-21 VITALS — BP 144/82 | HR 78 | Temp 97.6°F | Resp 16 | Ht 67.0 in | Wt 174.0 lb

## 2015-10-21 DIAGNOSIS — R059 Cough, unspecified: Secondary | ICD-10-CM

## 2015-10-21 DIAGNOSIS — R05 Cough: Secondary | ICD-10-CM | POA: Diagnosis not present

## 2015-10-21 NOTE — Progress Notes (Signed)
Pre visit review using our clinic review tool, if applicable. No additional management support is needed unless otherwise documented below in the visit note. 

## 2015-10-21 NOTE — Progress Notes (Signed)
   Subjective:    Patient ID: Jason Hart, male    DOB: Aug 02, 1946, 70 y.o.   MRN: [id number]  Chief Complaint  Patient presents with  . Nasal Congestion    wakes up in the mornings and has some congestion in his chest that he has to cough up clears up through the day    HPI:  Jason Hart is a 70 y.o. male who  has a past medical history of Trigger point with temporomandibular joint pain; BPH (benign prostatic hypertrophy); Conductive hearing loss; Hyperlipidemia; Psoriatic arthritis ([place]); and Arthritis. and presents today for an acute office visit.   Associated symptom of chest congestion has been going on for several weeks with the timing of the symptoms worse in the morning and will occasionally improve throughout the day. Denies fevers. No new medicines. Describes a sour taste in his mouth at times. Will occasionally cough more after eating finishing eating.  Has had episodes of rhinorrhea and sneezing that wax and wane.    No Known Allergies   Current Outpatient Prescriptions on File Prior to Visit  Medication Sig Dispense Refill  . folic acid (FOLVITE) 1 MG tablet Take 1 tablet (1 mg total) by mouth daily. 90 tablet 3  . methotrexate (RHEUMATREX) 2.5 MG tablet Take 2.5 mg by mouth once a week. Caution:Chemotherapy. Protect from light.    . pravastatin (PRAVACHOL) 20 MG tablet Take 1 tablet (20 mg total) by mouth daily. 90 tablet 3   No current facility-administered medications on file prior to visit.    Review of Systems  Constitutional: Negative for fever and chills.  HENT: Positive for sneezing. Negative for congestion and sore throat.   Respiratory: Positive for cough. Negative for chest tightness and shortness of breath.   Cardiovascular: Negative for chest pain, palpitations and leg swelling.  Neurological: Negative for headaches.      Objective:    BP 144/82 mmHg  Pulse 78  Temp(Src) 97.6 F (36.4 C) (Oral)  Resp 16  Ht 5\' 7"  (1.702 m)  Wt 174  lb (78.926 kg)  BMI 27.25 kg/m2  SpO2 96% Nursing note and vital signs reviewed.  Physical Exam  Constitutional: He is oriented to person, place, and time. He appears well-developed and well-nourished. No distress.  HENT:  Right Ear: Hearing, tympanic membrane, external ear and ear canal normal.  Left Ear: Hearing, tympanic membrane, external ear and ear canal normal.  Nose: Nose normal. Right sinus exhibits no maxillary sinus tenderness and no frontal sinus tenderness. Left sinus exhibits no maxillary sinus tenderness and no frontal sinus tenderness.  Mouth/Throat: Uvula is midline, oropharynx is clear and moist and mucous membranes are normal.  Cardiovascular: Normal rate, regular rhythm, normal heart sounds and intact distal pulses.   Pulmonary/Chest: Effort normal and breath sounds normal.  Neurological: He is alert and oriented to person, place, and time.  Skin: Skin is warm and dry.  Psychiatric: He has a normal mood and affect. His behavior is normal. Judgment and thought content normal.       Assessment & Plan:   Problem List Items Addressed This Visit      Other   Cough - Primary    Continues to experience the associated symptom of cough most likely related to either GERD or seasonal allergies. Recommend starting over-the-counter medications including second-generation antihistamines or H2 blockers. Follow-up if symptoms worsen or fail to improve.

## 2015-10-21 NOTE — Assessment & Plan Note (Signed)
Continues to experience the associated symptom of cough most likely related to either GERD or seasonal allergies. Recommend starting over-the-counter medications including second-generation antihistamines or H2 blockers. Follow-up if symptoms worsen or fail to improve.

## 2015-10-21 NOTE — Patient Instructions (Addendum)
Thank you for choosing [facility].  Summary/Instructions:  Please start taking Claratin, Allegra or Zyrtec for seasonal allergies.  If this does not improve your symptoms consider either Pepcid or Zantac.   If your symptoms worsen or fail to improve, please contact our office for further instruction, or in case of emergency go directly to the emergency room at the closest medical facility.   Allergies An allergy is an abnormal reaction to a substance by the body's defense system (immune system). Allergies can develop at any age. WHAT CAUSES ALLERGIES? An allergic reaction happens when the immune system mistakenly reacts to a normally harmless substance, called an allergen, as if it were harmful. The immune system releases antibodies to fight the substance. Antibodies eventually release a chemical called histamine into the bloodstream. The release of histamine is meant to protect the body from infection, but it also causes discomfort. An allergic reaction can be triggered by:  Eating an allergen.  Inhaling an allergen.  Touching an allergen. WHAT TYPES OF ALLERGIES ARE THERE? There are many types of allergies. Common types include:  Seasonal allergies. People with this type of allergy are usually allergic to substances that are only present during certain seasons, such as molds and pollens.  Food allergies.  Drug allergies.  Insect allergies.  Animal dander allergies. WHAT ARE SYMPTOMS OF ALLERGIES? Possible allergy symptoms include:  Swelling of the lips, face, tongue, mouth, or throat.  Sneezing, coughing, or wheezing.  Nasal congestion.  Tingling in the mouth.  Rash.  Itching.  Itchy, red, swollen areas of skin (hives).  Watery eyes.  Vomiting.  Diarrhea.  Dizziness.  Lightheadedness.  Fainting.  Trouble breathing or swallowing.  Chest tightness.  Rapid heartbeat. HOW ARE ALLERGIES DIAGNOSED? Allergies are diagnosed with a medical and  family history and one or more of the following:  Skin tests.  Blood tests.  A food diary. A food diary is a record of all the foods and drinks you have in a day and of all the symptoms you experience.  The results of an elimination diet. An elimination diet involves eliminating foods from your diet and then adding them back in one by one to find out if a certain food causes an allergic reaction. HOW ARE ALLERGIES TREATED? There is no cure for allergies, but allergic reactions can be treated with medicine. Severe reactions usually need to be treated at a hospital. HOW CAN REACTIONS BE PREVENTED? The best way to prevent an allergic reaction is by avoiding the substance you are allergic to. Allergy shots and medicines can also help prevent reactions in some cases. People with severe allergic reactions may be able to prevent a life-threatening reaction called anaphylaxis with a medicine given right after exposure to the allergen.   This information is not intended to replace advice given to you by your health care provider. Make sure you discuss any questions you have with your health care provider.   Document Released: 10/20/2002 Document Revised: 08/17/2014 Document Reviewed: 05/08/2014 Elsevier Interactive Patient Education 2016 [employer].  Gastroesophageal Reflux Disease, Adult Normally, food travels down the esophagus and stays in the stomach to be digested. However, when a person has gastroesophageal reflux disease (GERD), food and stomach acid move back up into the esophagus. When this happens, the esophagus becomes sore and inflamed. Over time, GERD can create small holes (ulcers) in the lining of the esophagus.  CAUSES This condition is caused by a problem with the muscle between the esophagus and the stomach (lower  esophageal sphincter, or LES). Normally, the LES muscle closes after food passes through the esophagus to the stomach. When the LES is weakened or abnormal, it does not  close properly, and that allows food and stomach acid to go back up into the esophagus. The LES can be weakened by certain dietary substances, medicines, and medical conditions, including:  Tobacco use.  Pregnancy.  Having a hiatal hernia.  Heavy alcohol use.  Certain foods and beverages, such as coffee, chocolate, onions, and peppermint. RISK FACTORS This condition is more likely to develop in:  People who have an increased body weight.  People who have connective tissue disorders.  People who use NSAID medicines. SYMPTOMS Symptoms of this condition include:  Heartburn.  Difficult or painful swallowing.  The feeling of having a lump in the throat.  Abitter taste in the mouth.  Bad breath.  Having a large amount of saliva.  Having an upset or bloated stomach.  Belching.  Chest pain.  Shortness of breath or wheezing.  Ongoing (chronic) cough or a night-time cough.  Wearing away of tooth enamel.  Weight loss. Different conditions can cause chest pain. Make sure to see your health care provider if you experience chest pain. DIAGNOSIS Your health care provider will take a medical history and perform a physical exam. To determine if you have mild or severe GERD, your health care provider may also monitor how you respond to treatment. You may also have other tests, including:  An endoscopy toexamine your stomach and esophagus with a small camera.  A test thatmeasures the acidity level in your esophagus.  A test thatmeasures how much pressure is on your esophagus.  A barium swallow or modified barium swallow to show the shape, size, and functioning of your esophagus. TREATMENT The goal of treatment is to help relieve your symptoms and to prevent complications. Treatment for this condition may vary depending on how severe your symptoms are. Your health care provider may recommend:  Changes to your diet.  Medicine.  Surgery. HOME CARE  INSTRUCTIONS Diet  Follow a diet as recommended by your health care provider. This may involve avoiding foods and drinks such as:  Coffee and tea (with or without caffeine).  Drinks that containalcohol.  Energy drinks and sports drinks.  Carbonated drinks or sodas.  Chocolate and cocoa.  Peppermint and mint flavorings.  Garlic and onions.  Horseradish.  Spicy and acidic foods, including peppers, chili powder, curry powder, vinegar, hot sauces, and barbecue sauce.  Citrus fruit juices and citrus fruits, such as oranges, lemons, and limes.  Tomato-based foods, such as red sauce, chili, salsa, and pizza with red sauce.  Fried and fatty foods, such as donuts, french fries, potato chips, and high-fat dressings.  High-fat meats, such as hot dogs and fatty cuts of red and white meats, such as rib eye steak, sausage, ham, and bacon.  High-fat dairy items, such as whole milk, butter, and cream cheese.  Eat small, frequent meals instead of large meals.  Avoid drinking large amounts of liquid with your meals.  Avoid eating meals during the 2-3 hours before bedtime.  Avoid lying down right after you eat.  Do not exercise right after you eat. General Instructions  Pay attention to any changes in your symptoms.  Take over-the-counter and prescription medicines only as told by your health care provider. Do not take aspirin, ibuprofen, or other NSAIDs unless your health care provider told you to do so.  Do not use any tobacco products, including  cigarettes, chewing tobacco, and e-cigarettes. If you need help quitting, ask your health care provider.  Wear loose-fitting clothing. Do not wear anything tight around your waist that causes pressure on your abdomen.  Raise (elevate) the head of your bed 6 inches (15cm).  Try to reduce your stress, such as with yoga or meditation. If you need help reducing stress, ask your health care provider.  If you are overweight, reduce your  weight to an amount that is healthy for you. Ask your health care provider for guidance about a safe weight loss goal.  Keep all follow-up visits as told by your health care provider. This is important. SEEK MEDICAL CARE IF:  You have new symptoms.  You have unexplained weight loss.  You have difficulty swallowing, or it hurts to swallow.  You have wheezing or a persistent cough.  Your symptoms do not improve with treatment.  You have a hoarse voice. SEEK IMMEDIATE MEDICAL CARE IF:  You have pain in your arms, neck, jaw, teeth, or back.  You feel sweaty, dizzy, or light-headed.  You have chest pain or shortness of breath.  You vomit and your vomit looks like blood or coffee grounds.  You faint.  Your stool is bloody or black.  You cannot swallow, drink, or eat.   This information is not intended to replace advice given to you by your health care provider. Make sure you discuss any questions you have with your health care provider.   Document Released: 05/06/2005 Document Revised: 04/17/2015 Document Reviewed: 11/21/2014 Elsevier Interactive Patient Education [employer].

## 2015-12-30 DIAGNOSIS — L4050 Arthropathic psoriasis, unspecified: Secondary | ICD-10-CM | POA: Diagnosis not present

## 2016-02-24 ENCOUNTER — Ambulatory Visit (INDEPENDENT_AMBULATORY_CARE_PROVIDER_SITE_OTHER): Payer: BLUE CROSS/BLUE SHIELD | Admitting: Family

## 2016-02-24 ENCOUNTER — Encounter: Payer: Self-pay | Admitting: Family

## 2016-02-24 ENCOUNTER — Other Ambulatory Visit (INDEPENDENT_AMBULATORY_CARE_PROVIDER_SITE_OTHER): Payer: BLUE CROSS/BLUE SHIELD

## 2016-02-24 VITALS — BP 130/72 | HR 65 | Temp 98.0°F | Resp 20 | Ht 67.0 in | Wt 174.0 lb

## 2016-02-24 DIAGNOSIS — Z0000 Encounter for general adult medical examination without abnormal findings: Secondary | ICD-10-CM

## 2016-02-24 DIAGNOSIS — Z7289 Other problems related to lifestyle: Secondary | ICD-10-CM

## 2016-02-24 DIAGNOSIS — Z23 Encounter for immunization: Secondary | ICD-10-CM | POA: Diagnosis not present

## 2016-02-24 DIAGNOSIS — Z0001 Encounter for general adult medical examination with abnormal findings: Secondary | ICD-10-CM | POA: Insufficient documentation

## 2016-02-24 LAB — COMPREHENSIVE METABOLIC PANEL
ALK PHOS: 59 U/L (ref 39–117)
ALT: 24 U/L (ref 0–53)
AST: 25 U/L (ref 0–37)
Albumin: 4.3 g/dL (ref 3.5–5.2)
BILIRUBIN TOTAL: 0.7 mg/dL (ref 0.2–1.2)
BUN: 19 mg/dL (ref 6–23)
CALCIUM: 9.5 mg/dL (ref 8.4–10.5)
CO2: 31 mEq/L (ref 19–32)
Chloride: 103 mEq/L (ref 96–112)
Creatinine, Ser: 0.97 mg/dL (ref 0.40–1.50)
GFR: 81.37 mL/min (ref >60.00)
Glucose, Bld: 97 mg/dL (ref 70–99)
Potassium: 4.2 mEq/L (ref 3.5–5.1)
Sodium: 140 mEq/L (ref 135–145)
TOTAL PROTEIN: 6.9 g/dL (ref 6.0–8.3)

## 2016-02-24 LAB — CBC
HCT: 46 % (ref 39.0–52.0)
HEMOGLOBIN: 15.9 g/dL (ref 13.0–17.0)
MCHC: 34.6 g/dL (ref 30.0–36.0)
MCV: 93.9 fl (ref 78.0–100.0)
PLATELETS: 231 10*3/uL (ref 150.0–400.0)
RBC: 4.9 Mil/uL (ref 4.22–5.81)
RDW: 13.9 % (ref 11.5–15.5)
WBC: 6.9 10*3/uL (ref 4.0–10.5)

## 2016-02-24 LAB — LIPID PANEL
Cholesterol: 161 mg/dL (ref 0–200)
HDL: 44.6 mg/dL (ref >39.00)
LDL Cholesterol: 95 mg/dL (ref 0–99)
NONHDL: 116.7
TRIGLYCERIDES: 108 mg/dL (ref 0.0–149.0)
Total CHOL/HDL Ratio: 4
VLDL: 21.6 mg/dL (ref 0.0–40.0)

## 2016-02-24 LAB — PSA: PSA: 1.19 ng/mL (ref 0.10–4.00)

## 2016-02-24 LAB — HEPATITIS C ANTIBODY: HCV AB: NEGATIVE

## 2016-02-24 NOTE — Patient Instructions (Addendum)
Thank you for choosing [facility].  Summary/Instructions:  Continue to take your medications as prescribed.   Please stop by the lab on the lower level of the building for your blood work. Your results will be released to [place] (or called to you) after review, usually within 72 hours after test completion. If any changes need to be made, you will be notified at that same time.  1. The lab is open from 7:30am to 5:30 pm Monday-Friday  2. No appointment is necessary  3. Fasting (if needed) is 6-8 hours after food and drink; black coffee  and water are okay    Health Maintenance, Male A healthy lifestyle and preventative care can promote health and wellness.  Maintain regular health, dental, and eye exams.  Eat a healthy diet. Foods like vegetables, fruits, whole grains, low-fat dairy products, and lean protein foods contain the nutrients you need and are low in calories. Decrease your intake of foods high in solid fats, added sugars, and salt. Get information about a proper diet from your health care provider, if necessary.  Regular physical exercise is one of the most important things you can do for your health. Most adults should get at least 150 minutes of moderate-intensity exercise (any activity that increases your heart rate and causes you to sweat) each week. In addition, most adults need muscle-strengthening exercises on 2 or more days a week.   Maintain a healthy weight. The body mass index (BMI) is a screening tool to identify possible weight problems. It provides an estimate of body fat based on height and weight. Your health care provider can find your BMI and can help you achieve or maintain a healthy weight. For males 20 years and older:  A BMI below 18.5 is considered underweight.  A BMI of 18.5 to 24.9 is normal.  A BMI of 25 to 29.9 is considered overweight.  A BMI of 30 and above is considered obese.  Maintain normal blood lipids and cholesterol by exercising  and minimizing your intake of saturated fat. Eat a balanced diet with plenty of fruits and vegetables. Blood tests for lipids and cholesterol should begin at age 49 and be repeated every 5 years. If your lipid or cholesterol levels are high, you are over age 43, or you are at high risk for heart disease, you may need your cholesterol levels checked more frequently.Ongoing high lipid and cholesterol levels should be treated with medicines if diet and exercise are not working.  If you smoke, find out from your health care provider how to quit. If you do not use tobacco, do not start.  Lung cancer screening is recommended for adults aged 15-80 years who are at high risk for developing lung cancer because of a history of smoking. A yearly low-dose CT scan of the lungs is recommended for people who have at least a 30-pack-year history of smoking and are current smokers or have quit within the past 15 years. A pack year of smoking is smoking an average of 1 pack of cigarettes a day for 1 year (for example, a 30-pack-year history of smoking could mean smoking 1 pack a day for 30 years or 2 packs a day for 15 years). Yearly screening should continue until the smoker has stopped smoking for at least 15 years. Yearly screening should be stopped for people who develop a health problem that would prevent them from having lung cancer treatment.  If you choose to drink alcohol, do not have more than  2 drinks per day. One drink is considered to be 12 oz (360 mL) of beer, 5 oz (150 mL) of wine, or 1.5 oz (45 mL) of liquor.  Avoid the use of street drugs. Do not share needles with anyone. Ask for help if you need support or instructions about stopping the use of drugs.  High blood pressure causes heart disease and increases the risk of stroke. High blood pressure is more likely to develop in:  People who have blood pressure in the end of the normal range (100-139/85-89 mm Hg).  People who are overweight or  obese.  People who are African American.  If you are 83-91 years of age, have your blood pressure checked every 3-5 years. If you are 54 years of age or older, have your blood pressure checked every year. You should have your blood pressure measured twice--once when you are at a hospital or clinic, and once when you are not at a hospital or clinic. Record the average of the two measurements. To check your blood pressure when you are not at a hospital or clinic, you can use:  An automated blood pressure machine at a pharmacy.  A home blood pressure monitor.  If you are 51-84 years old, ask your health care provider if you should take aspirin to prevent heart disease.  Diabetes screening involves taking a blood sample to check your fasting blood sugar level. This should be done once every 3 years after age 58 if you are at a normal weight and without risk factors for diabetes. Testing should be considered at a younger age or be carried out more frequently if you are overweight and have at least 1 risk factor for diabetes.  Colorectal cancer can be detected and often prevented. Most routine colorectal cancer screening begins at the age of 17 and continues through age 49. However, your health care provider may recommend screening at an earlier age if you have risk factors for colon cancer. On a yearly basis, your health care provider may provide home test kits to check for hidden blood in the stool. A small camera at the end of a tube may be used to directly examine the colon (sigmoidoscopy or colonoscopy) to detect the earliest forms of colorectal cancer. Talk to your health care provider about this at age 54 when routine screening begins. A direct exam of the colon should be repeated every 5-10 years through age 87, unless early forms of precancerous polyps or small growths are found.  People who are at an increased risk for hepatitis B should be screened for this virus. You are considered at high risk  for hepatitis B if:  You were born in a country where hepatitis B occurs often. Talk with your health care provider about which countries are considered high risk.  Your parents were born in a high-risk country and you have not received a shot to protect against hepatitis B (hepatitis B vaccine).  You have HIV or AIDS.  You use needles to inject street drugs.  You live with, or have sex with, someone who has hepatitis B.  You are a man who has sex with other men (MSM).  You get hemodialysis treatment.  You take certain medicines for conditions like cancer, organ transplantation, and autoimmune conditions.  Hepatitis C blood testing is recommended for all people born from [birth year] through 1965 and any individual with known risk factors for hepatitis C.  Healthy men should no longer receive prostate-specific antigen (PSA) blood  tests as part of routine cancer screening. Talk to your health care provider about prostate cancer screening.  Testicular cancer screening is not recommended for adolescents or adult males who have no symptoms. Screening includes self-exam, a health care provider exam, and other screening tests. Consult with your health care provider about any symptoms you have or any concerns you have about testicular cancer.  Practice safe sex. Use condoms and avoid high-risk sexual practices to reduce the spread of sexually transmitted infections (STIs).  You should be screened for STIs, including gonorrhea and chlamydia if:  You are sexually active and are younger than 24 years.  You are older than 24 years, and your health care provider tells you that you are at risk for this type of infection.  Your sexual activity has changed since you were last screened, and you are at an increased risk for chlamydia or gonorrhea. Ask your health care provider if you are at risk.  If you are at risk of being infected with HIV, it is recommended that you take a prescription medicine daily to  prevent HIV infection. This is called pre-exposure prophylaxis (PrEP). You are considered at risk if:  You are a man who has sex with other men (MSM).  You are a heterosexual man who is sexually active with multiple partners.  You take drugs by injection.  You are sexually active with a partner who has HIV.  Talk with your health care provider about whether you are at high risk of being infected with HIV. If you choose to begin PrEP, you should first be tested for HIV. You should then be tested every 3 months for as long as you are taking PrEP.  Use sunscreen. Apply sunscreen liberally and repeatedly throughout the day. You should seek shade when your shadow is shorter than you. Protect yourself by wearing long sleeves, pants, a wide-brimmed hat, and sunglasses year round whenever you are outdoors.  Tell your health care provider of new moles or changes in moles, especially if there is a change in shape or color. Also, tell your health care provider if a mole is larger than the size of a pencil eraser.  A one-time screening for abdominal aortic aneurysm (AAA) and surgical repair of large AAAs by ultrasound is recommended for men aged 47-75 years who are current or former smokers.  Stay current with your vaccines (immunizations).   This information is not intended to replace advice given to you by your health care provider. Make sure you discuss any questions you have with your health care provider.   Document Released: 01/23/2008 Document Revised: 08/17/2014 Document Reviewed: 12/22/2010 Elsevier Interactive Patient Education [employer].

## 2016-02-24 NOTE — Progress Notes (Signed)
Pre visit review using our clinic review tool, if applicable. No additional management support is needed unless otherwise documented below in the visit note. 

## 2016-02-24 NOTE — Progress Notes (Signed)
Subjective:    Patient ID: Jason Hart, male    DOB: April 29, 1946, 70 y.o.   MRN: [id number]  Chief Complaint  Patient presents with  . Annual Exam    HPI:  Jason Hart is a 70 y.o. male who presents today for an annual wellness visit.   1) Health Maintenance -   Diet - Averages about 2-3 meals per day consisting of fruits, vegetables, chicken, and occasional beef; Caffeine intake of about 2-3 cups daily  Exercise - Play golf and walks a lot; 15-20 miles per week.   2) Preventative Exams / Immunizations:  Dental - Up to date  Vision -- Up to date   Health Maintenance  Topic Date Due  . Hepatitis C Screening  1945/10/22  . PNA vac Low Risk Adult (1 of 2 - PCV13) 05/09/2011  . INFLUENZA VACCINE  04/24/2016 (Originally 03/10/2016)  . TETANUS/TDAP  02/15/2023  . COLONOSCOPY  03/10/2025  . ZOSTAVAX  Completed    Immunization History  Administered Date(s) Administered  . Pneumococcal Conjugate-13 02/24/2016  . Tetanus 02/14/2013  . Zoster 09/09/2015   No Known Allergies   Outpatient Prescriptions Prior to Visit  Medication Sig Dispense Refill  . folic acid (FOLVITE) 1 MG tablet Take 1 tablet (1 mg total) by mouth daily. 90 tablet 3  . methotrexate (RHEUMATREX) 2.5 MG tablet Take 2.5 mg by mouth once a week. Caution:Chemotherapy. Protect from light.    . pravastatin (PRAVACHOL) 20 MG tablet Take 1 tablet (20 mg total) by mouth daily. 90 tablet 3   No facility-administered medications prior to visit.     Past Medical History  Diagnosis Date  . Trigger point with temporomandibular joint pain   . BPH (benign prostatic hypertrophy)   . Conductive hearing loss   . Hyperlipidemia   . Psoriatic arthritis ([place])   . Arthritis      Past Surgical History  Procedure Laterality Date  . Tonsillectomy    . Colonoscopy  2006    Negative;due 2016; [facility]     Family History  Problem Relation Age of Onset  . Throat cancer Mother     smoker  .  Hyperlipidemia Father   . Heart attack Father 46  . Sudden death Father   . Hypertension Neg Hx   . Diabetes Neg Hx   . Stroke Neg Hx   . Colon cancer Neg Hx   . Colon polyps Neg Hx   . Rectal cancer Neg Hx   . Stomach cancer Neg Hx   . Heart attack Paternal Grandfather 47  . Coronary artery disease Brother 62    stent  . Prostate cancer Brother 30  . Benign prostatic hyperplasia Brother     TURP     Social History   Social History  . Marital Status: Married    Spouse Name: N/A  . Number of Children: N/A  . Years of Education: N/A   Occupational History  . SALES    Social History Main Topics  . Smoking status: Never Smoker   . Smokeless tobacco: Never Used  . Alcohol Use: No     Comment: not since MTX started  . Drug Use: No  . Sexual Activity: Not on file   Other Topics Concern  . Not on file   Social History Narrative   REG EXERCISE     Review of Systems  Constitutional: Denies fever, chills, fatigue, or significant weight gain/loss. HENT: Head: Denies headache or neck pain Ears:  Denies changes in hearing, ringing in ears, earache, drainage Nose: Denies discharge, stuffiness, itching, nosebleed, sinus pain Throat: Denies sore throat, hoarseness, dry mouth, sores, thrush Eyes: Denies loss/changes in vision, pain, redness, blurry/double vision, flashing lights Cardiovascular: Denies chest pain/discomfort, tightness, palpitations, shortness of breath with activity, difficulty lying down, swelling, sudden awakening with shortness of breath Respiratory: Denies shortness of breath, cough, sputum production, wheezing Gastrointestinal: Denies dysphasia, heartburn, change in appetite, nausea, change in bowel habits, rectal bleeding, constipation, diarrhea, yellow skin or eyes Genitourinary: Denies frequency, urgency, burning/pain, blood in urine, incontinence, change in urinary strength. Musculoskeletal: Denies muscle/joint pain, stiffness, back pain, redness or  swelling of joints, trauma Skin: Denies rashes, lumps, itching, dryness, color changes, or hair/nail changes Neurological: Denies dizziness, fainting, seizures, weakness, numbness, tingling, tremor Psychiatric - Denies nervousness, stress, depression or memory loss Endocrine: Denies heat or cold intolerance, sweating, frequent urination, excessive thirst, changes in appetite Hematologic: Denies ease of bruising or bleeding     Objective:     BP 130/72 mmHg  Pulse 65  Temp(Src) 98 F (36.7 C) (Oral)  Resp 20  Ht 5\' 7"  (1.702 m)  Wt 174 lb (78.926 kg)  BMI 27.25 kg/m2  SpO2 98% Nursing note and vital signs reviewed.  Physical Exam  Constitutional: He is oriented to person, place, and time. He appears well-developed and well-nourished.  HENT:  Head: Normocephalic.  Right Ear: Hearing, tympanic membrane, external ear and ear canal normal.  Left Ear: Hearing, tympanic membrane, external ear and ear canal normal.  Nose: Nose normal.  Mouth/Throat: Uvula is midline, oropharynx is clear and moist and mucous membranes are normal.  Eyes: Conjunctivae and EOM are normal. Pupils are equal, round, and reactive to light.  Neck: Neck supple. No JVD present. No tracheal deviation present. No thyromegaly present.  Cardiovascular: Normal rate, regular rhythm, normal heart sounds and intact distal pulses.   Pulmonary/Chest: Effort normal and breath sounds normal.  Abdominal: Soft. Bowel sounds are normal. He exhibits no distension and no mass. There is no tenderness. There is no rebound and no guarding.  Musculoskeletal: Normal range of motion. He exhibits no edema or tenderness.  Lymphadenopathy:    He has no cervical adenopathy.  Neurological: He is alert and oriented to person, place, and time. He has normal reflexes. No cranial nerve deficit. He exhibits normal muscle tone. Coordination normal.  Skin: Skin is warm and dry.  Psychiatric: He has a normal mood and affect. His behavior is  normal. Judgment and thought content normal.       Assessment & Plan:   Problem List Items Addressed This Visit      Other   Routine general medical examination at a health care facility - Primary    1) Anticipatory Guidance: Discussed importance of wearing a seatbelt while driving and not texting while driving; changing batteries in smoke detector at least once annually; wearing suntan lotion when outside; eating a balanced and moderate diet; getting physical activity at least 30 minutes per day.  2) Immunizations / Screenings / Labs:  Prevnar updated today. Pneumovax in 6-12 months. All other immunizations are up to date per recommendations. Obtain Hepatitis C antibody for Hepatitis C screening. Obtain PSA for prostate cancer screening. All other screenings are up to date per recommendations.  Obtain CBC, CMET, and lipid profile.   Overall well exam. Risk factors for cardiovascular disease including hyperlipidemia and overweight. Recommend continued physical activity and nutrition as he appears to be doing very well. Cholesterol will be  evaluated through lipid profile. Recommend weight loss of approximately 5-10% of current body weight for improved health. Continue other healthy lifestyle behaviors and choices. Follow-up prevention exam in 1 year. Follow-up office visit for chronic conditions pending blood work as needed.       Relevant Orders   CBC (Completed)   Comprehensive metabolic panel (Completed)   Lipid panel (Completed)   PSA (Completed)    Other Visit Diagnoses    Other problems related to lifestyle        Relevant Orders    Hepatitis C antibody        I am having Mr. Shia maintain his methotrexate, pravastatin, and folic acid.   Follow-up: Return if symptoms worsen or fail to improve.   Mauricio Po, FNP

## 2016-02-24 NOTE — Assessment & Plan Note (Addendum)
1) Anticipatory Guidance: Discussed importance of wearing a seatbelt while driving and not texting while driving; changing batteries in smoke detector at least once annually; wearing suntan lotion when outside; eating a balanced and moderate diet; getting physical activity at least 30 minutes per day.  2) Immunizations / Screenings / Labs:  Prevnar updated today. Pneumovax in 6-12 months. All other immunizations are up to date per recommendations. Obtain Hepatitis C antibody for Hepatitis C screening. Obtain PSA for prostate cancer screening. All other screenings are up to date per recommendations.  Obtain CBC, CMET, and lipid profile.   Overall well exam. Risk factors for cardiovascular disease including hyperlipidemia and overweight. Recommend continued physical activity and nutrition as he appears to be doing very well. Cholesterol will be evaluated through lipid profile. Recommend weight loss of approximately 5-10% of current body weight for improved health. Continue other healthy lifestyle behaviors and choices. Follow-up prevention exam in 1 year. Follow-up office visit for chronic conditions pending blood work as needed.

## 2016-02-25 ENCOUNTER — Encounter: Payer: Self-pay | Admitting: Family

## 2016-04-02 DIAGNOSIS — L4050 Arthropathic psoriasis, unspecified: Secondary | ICD-10-CM | POA: Diagnosis not present

## 2016-05-12 ENCOUNTER — Other Ambulatory Visit: Payer: Self-pay | Admitting: Internal Medicine

## 2016-05-12 DIAGNOSIS — E785 Hyperlipidemia, unspecified: Secondary | ICD-10-CM

## 2016-05-28 ENCOUNTER — Ambulatory Visit (INDEPENDENT_AMBULATORY_CARE_PROVIDER_SITE_OTHER): Payer: BLUE CROSS/BLUE SHIELD

## 2016-05-28 DIAGNOSIS — Z23 Encounter for immunization: Secondary | ICD-10-CM

## 2016-06-25 DIAGNOSIS — L4050 Arthropathic psoriasis, unspecified: Secondary | ICD-10-CM | POA: Diagnosis not present

## 2016-10-05 DIAGNOSIS — L4050 Arthropathic psoriasis, unspecified: Secondary | ICD-10-CM | POA: Diagnosis not present

## 2016-11-23 DIAGNOSIS — L570 Actinic keratosis: Secondary | ICD-10-CM | POA: Diagnosis not present

## 2016-11-23 DIAGNOSIS — X32XXXD Exposure to sunlight, subsequent encounter: Secondary | ICD-10-CM | POA: Diagnosis not present

## 2016-12-30 ENCOUNTER — Encounter: Payer: Self-pay | Admitting: Family

## 2016-12-30 DIAGNOSIS — N492 Inflammatory disorders of scrotum: Secondary | ICD-10-CM

## 2017-01-14 DIAGNOSIS — L4050 Arthropathic psoriasis, unspecified: Secondary | ICD-10-CM | POA: Diagnosis not present

## 2017-02-11 DIAGNOSIS — H5213 Myopia, bilateral: Secondary | ICD-10-CM | POA: Diagnosis not present

## 2017-02-11 DIAGNOSIS — H2513 Age-related nuclear cataract, bilateral: Secondary | ICD-10-CM | POA: Diagnosis not present

## 2017-02-15 ENCOUNTER — Other Ambulatory Visit: Payer: Self-pay | Admitting: Family

## 2017-02-15 DIAGNOSIS — E785 Hyperlipidemia, unspecified: Secondary | ICD-10-CM

## 2017-02-24 ENCOUNTER — Encounter: Payer: BLUE CROSS/BLUE SHIELD | Admitting: Family

## 2017-03-16 ENCOUNTER — Other Ambulatory Visit (INDEPENDENT_AMBULATORY_CARE_PROVIDER_SITE_OTHER): Payer: BLUE CROSS/BLUE SHIELD

## 2017-03-16 ENCOUNTER — Encounter: Payer: Self-pay | Admitting: Family

## 2017-03-16 ENCOUNTER — Ambulatory Visit (INDEPENDENT_AMBULATORY_CARE_PROVIDER_SITE_OTHER): Payer: BLUE CROSS/BLUE SHIELD | Admitting: Family

## 2017-03-16 VITALS — BP 138/88 | HR 71 | Temp 97.8°F | Resp 16 | Ht 67.0 in | Wt 173.0 lb

## 2017-03-16 DIAGNOSIS — Z0000 Encounter for general adult medical examination without abnormal findings: Secondary | ICD-10-CM

## 2017-03-16 DIAGNOSIS — Z125 Encounter for screening for malignant neoplasm of prostate: Secondary | ICD-10-CM | POA: Diagnosis not present

## 2017-03-16 DIAGNOSIS — Z23 Encounter for immunization: Secondary | ICD-10-CM | POA: Diagnosis not present

## 2017-03-16 LAB — LIPID PANEL
Cholesterol: 153 mg/dL (ref 0–200)
HDL: 47.4 mg/dL (ref >39.00)
LDL Cholesterol: 86 mg/dL (ref 0–99)
NonHDL: 105.26
Total CHOL/HDL Ratio: 3
Triglycerides: 98 mg/dL (ref 0.0–149.0)
VLDL: 19.6 mg/dL (ref 0.0–40.0)

## 2017-03-16 LAB — COMPREHENSIVE METABOLIC PANEL
ALBUMIN: 4.3 g/dL (ref 3.5–5.2)
ALK PHOS: 64 U/L (ref 39–117)
ALT: 27 U/L (ref 0–53)
AST: 26 U/L (ref 0–37)
BILIRUBIN TOTAL: 0.6 mg/dL (ref 0.2–1.2)
BUN: 22 mg/dL (ref 6–23)
CO2: 31 mEq/L (ref 19–32)
Calcium: 9.5 mg/dL (ref 8.4–10.5)
Chloride: 103 mEq/L (ref 96–112)
Creatinine, Ser: 1.14 mg/dL (ref 0.40–1.50)
GFR: 67.33 mL/min (ref >60.00)
GLUCOSE: 95 mg/dL (ref 70–99)
Potassium: 4.5 mEq/L (ref 3.5–5.1)
Sodium: 141 mEq/L (ref 135–145)
TOTAL PROTEIN: 6.8 g/dL (ref 6.0–8.3)

## 2017-03-16 LAB — CBC
HCT: 48.3 % (ref 39.0–52.0)
Hemoglobin: 16.2 g/dL (ref 13.0–17.0)
MCHC: 33.5 g/dL (ref 30.0–36.0)
MCV: 97.3 fl (ref 78.0–100.0)
Platelets: 215 10*3/uL (ref 150.0–400.0)
RBC: 4.97 Mil/uL (ref 4.22–5.81)
RDW: 13.5 % (ref 11.5–15.5)
WBC: 5.7 10*3/uL (ref 4.0–10.5)

## 2017-03-16 LAB — PSA: PSA: 0.93 ng/mL (ref 0.10–4.00)

## 2017-03-16 NOTE — Patient Instructions (Addendum)
Thank you for choosing [facility].  SUMMARY AND INSTRUCTIONS:  Please continue to take your medications as prescribed.  You are doing great!  Labs:  Please stop by the lab on the lower level of the building for your blood work. Your results will be released to [place] (or called to you) after review, usually within 72 hours after test completion. If any changes need to be made, you will be notified at that same time.  1.) The lab is open from 7:30am to 5:30 pm Monday-Friday 2.) No appointment is necessary 3.) Fasting (if needed) is 6-8 hours after food and drink; black coffee and water are okay   Follow up:  If your symptoms worsen or fail to improve, please contact our office for further instruction, or in case of emergency go directly to the emergency room at the closest medical facility.     Health Maintenance, Male A healthy lifestyle and preventive care is important for your health and wellness. Ask your health care provider about what schedule of regular examinations is right for you. What should I know about weight and diet? Eat a Healthy Diet  Eat plenty of vegetables, fruits, whole grains, low-fat dairy products, and lean protein.  Do not eat a lot of foods high in solid fats, added sugars, or salt.  Maintain a Healthy Weight Regular exercise can help you achieve or maintain a healthy weight. You should:  Do at least 150 minutes of exercise each week. The exercise should increase your heart rate and make you sweat (moderate-intensity exercise).  Do strength-training exercises at least twice a week.  Watch Your Levels of Cholesterol and Blood Lipids  Have your blood tested for lipids and cholesterol every 5 years starting at 71 years of age. If you are at high risk for heart disease, you should start having your blood tested when you are 71 years old. You may need to have your cholesterol levels checked more often if: ? Your lipid or cholesterol levels are  high. ? You are older than 71 years of age. ? You are at high risk for heart disease.  What should I know about cancer screening? Many types of cancers can be detected early and may often be prevented. Lung Cancer  You should be screened every year for lung cancer if: ? You are a current smoker who has smoked for at least 30 years. ? You are a former smoker who has quit within the past 15 years.  Talk to your health care provider about your screening options, when you should start screening, and how often you should be screened.  Colorectal Cancer  Routine colorectal cancer screening usually begins at 71 years of age and should be repeated every 5-10 years until you are 71 years old. You may need to be screened more often if early forms of precancerous polyps or small growths are found. Your health care provider may recommend screening at an earlier age if you have risk factors for colon cancer.  Your health care provider may recommend using home test kits to check for hidden blood in the stool.  A small camera at the end of a tube can be used to examine your colon (sigmoidoscopy or colonoscopy). This checks for the earliest forms of colorectal cancer.  Prostate and Testicular Cancer  Depending on your age and overall health, your health care provider may do certain tests to screen for prostate and testicular cancer.  Talk to your health care provider about any symptoms  or concerns you have about testicular or prostate cancer.  Skin Cancer  Check your skin from head to toe regularly.  Tell your health care provider about any new moles or changes in moles, especially if: ? There is a change in a mole's size, shape, or color. ? You have a mole that is larger than a pencil eraser.  Always use sunscreen. Apply sunscreen liberally and repeat throughout the day.  Protect yourself by wearing long sleeves, pants, a wide-brimmed hat, and sunglasses when outside.  What should I know  about heart disease, diabetes, and high blood pressure?  If you are 44-7 years of age, have your blood pressure checked every 3-5 years. If you are 56 years of age or older, have your blood pressure checked every year. You should have your blood pressure measured twice-once when you are at a hospital or clinic, and once when you are not at a hospital or clinic. Record the average of the two measurements. To check your blood pressure when you are not at a hospital or clinic, you can use: ? An automated blood pressure machine at a pharmacy. ? A home blood pressure monitor.  Talk to your health care provider about your target blood pressure.  If you are between 69-66 years old, ask your health care provider if you should take aspirin to prevent heart disease.  Have regular diabetes screenings by checking your fasting blood sugar level. ? If you are at a normal weight and have a low risk for diabetes, have this test once every three years after the age of 11. ? If you are overweight and have a high risk for diabetes, consider being tested at a younger age or more often.  A one-time screening for abdominal aortic aneurysm (AAA) by ultrasound is recommended for men aged 31-75 years who are current or former smokers. What should I know about preventing infection? Hepatitis B If you have a higher risk for hepatitis B, you should be screened for this virus. Talk with your health care provider to find out if you are at risk for hepatitis B infection. Hepatitis C Blood testing is recommended for:  Everyone born from [birth year] through 1965.  Anyone with known risk factors for hepatitis C.  Sexually Transmitted Diseases (STDs)  You should be screened each year for STDs including gonorrhea and chlamydia if: ? You are sexually active and are younger than 71 years of age. ? You are older than 71 years of age and your health care provider tells you that you are at risk for this type of infection. ? Your  sexual activity has changed since you were last screened and you are at an increased risk for chlamydia or gonorrhea. Ask your health care provider if you are at risk.  Talk with your health care provider about whether you are at high risk of being infected with HIV. Your health care provider may recommend a prescription medicine to help prevent HIV infection.  What else can I do?  Schedule regular health, dental, and eye exams.  Stay current with your vaccines (immunizations).  Do not use any tobacco products, such as cigarettes, chewing tobacco, and e-cigarettes. If you need help quitting, ask your health care provider.  Limit alcohol intake to no more than 2 drinks per day. One drink equals 12 ounces of beer, 5 ounces of wine, or 1 ounces of hard liquor.  Do not use street drugs.  Do not share needles.  Ask your health care provider  for help if you need support or information about quitting drugs.  Tell your health care provider if you often feel depressed.  Tell your health care provider if you have ever been abused or do not feel safe at home. This information is not intended to replace advice given to you by your health care provider. Make sure you discuss any questions you have with your health care provider. Document Released: 01/23/2008 Document Revised: 03/25/2016 Document Reviewed: 04/30/2015 Elsevier Interactive Patient Education  [employer].

## 2017-03-16 NOTE — Assessment & Plan Note (Signed)
1) Anticipatory Guidance: Discussed importance of wearing a seatbelt while driving and not texting while driving; changing batteries in smoke detector at least once annually; wearing suntan lotion when outside; eating a balanced and moderate diet; getting physical activity at least 30 minutes per day.  2) Immunizations / Screenings / Labs:  Pneumovax updated today. All other immunizations are up-to-date per recommendations. Colon cancer screening is up-to-date. Obtain PSA for prostate cancer screening. All other screenings are up-to-date per recommendations. Obtain CBC, CMET, and lipid profile.   Overall well exam with risk factors for cardiovascular disease including hyperlipidemia. He exercises regularly and is of good body weight. Overall healthy exam. Continue healthy lifestyle behaviors and choices. Follow-up prevention exam in 1 year. Follow-up office visit pending blood work as needed.

## 2017-03-16 NOTE — Progress Notes (Signed)
Subjective:    Patient ID: Jason Hart, male    DOB: 06-05-1946, 71 y.o.   MRN: [id number]  Chief Complaint  Patient presents with  . CPE    fasting    HPI:  Jason Hart is a 71 y.o. male who presents today for an annual wellness visit.   1) Health Maintenance -   Diet - Averages about 2-3 meals per day consisting of a regular diet; Caffeine intake of about 1-2 cups per day.   Exercise - Playing golf; walks all the time at work and golf ~ 20 miles per week  2) Preventative Exams / Immunizations:  Dental -- Up to date  Vision -- Up to date   Health Maintenance  Topic Date Due  . PNA vac Low Risk Adult (2 of 2 - PPSV23) 02/23/2017  . INFLUENZA VACCINE  03/10/2017  . TETANUS/TDAP  02/15/2023  . COLONOSCOPY  03/10/2025  . Hepatitis C Screening  Completed    Immunization History  Administered Date(s) Administered  . Influenza, High Dose Seasonal PF 05/28/2016  . Pneumococcal Conjugate-13 02/24/2016  . Tetanus 02/14/2013  . Zoster 09/09/2015     No Known Allergies   Outpatient Medications Prior to Visit  Medication Sig Dispense Refill  . folic acid (FOLVITE) 1 MG tablet Take 1 tablet (1 mg total) by mouth daily. 90 tablet 3  . methotrexate (RHEUMATREX) 2.5 MG tablet Take 2.5 mg by mouth once a week. Caution:Chemotherapy. Protect from light.    . pravastatin (PRAVACHOL) 20 MG tablet Take one tablet by mouth daily. Needs office visit for more refills. 30 tablet 0   No facility-administered medications prior to visit.      Past Medical History:  Diagnosis Date  . Arthritis   . BPH (benign prostatic hypertrophy)   . Conductive hearing loss   . Hyperlipidemia   . Psoriatic arthritis ([place])   . Trigger point with temporomandibular joint pain      Past Surgical History:  Procedure Laterality Date  . COLONOSCOPY  2006   Negative;due 2016; [facility]  . TONSILLECTOMY       Family History  Problem Relation Age of Onset  . Throat cancer  Mother        smoker  . Hyperlipidemia Father   . Heart attack Father 4  . Sudden death Father   . Heart attack Paternal Grandfather 29  . Coronary artery disease Brother 62       stent  . Prostate cancer Brother 39  . Benign prostatic hyperplasia Brother        TURP  . Hypertension Neg Hx   . Diabetes Neg Hx   . Stroke Neg Hx   . Colon cancer Neg Hx   . Colon polyps Neg Hx   . Rectal cancer Neg Hx   . Stomach cancer Neg Hx      Social History   Social History  . Marital status: Married    Spouse name: N/A  . Number of children: N/A  . Years of education: N/A   Occupational History  . SALES Engineer, agricultural   Social History Main Topics  . Smoking status: Never Smoker  . Smokeless tobacco: Never Used  . Alcohol use No     Comment: not since MTX started; occasional  . Drug use: No  . Sexual activity: Not on file   Other Topics Concern  . Not on file   Social History Narrative   Fun/Hobby: [employer]  Review of Systems  Constitutional: Denies fever, chills, fatigue, or significant weight gain/loss. HENT: Head: Denies headache or neck pain Ears: Denies changes in hearing, ringing in ears, earache, drainage Nose: Denies discharge, stuffiness, itching, nosebleed, sinus pain Throat: Denies sore throat, hoarseness, dry mouth, sores, thrush Eyes: Denies loss/changes in vision, pain, redness, blurry/double vision, flashing lights Cardiovascular: Denies chest pain/discomfort, tightness, palpitations, shortness of breath with activity, difficulty lying down, swelling, sudden awakening with shortness of breath Respiratory: Denies shortness of breath, cough, sputum production, wheezing Gastrointestinal: Denies dysphasia, heartburn, change in appetite, nausea, change in bowel habits, rectal bleeding, constipation, diarrhea, yellow skin or eyes Genitourinary: Denies frequency, urgency, burning/pain, blood in urine, incontinence, change in urinary  strength. Musculoskeletal: Denies muscle/joint pain, stiffness, back pain, redness or swelling of joints, trauma Skin: Denies rashes, lumps, itching, dryness, color changes, or hair/nail changes Neurological: Denies dizziness, fainting, seizures, weakness, numbness, tingling, tremor Psychiatric - Denies nervousness, stress, depression or memory loss Endocrine: Denies heat or cold intolerance, sweating, frequent urination, excessive thirst, changes in appetite Hematologic: Denies ease of bruising or bleeding     Objective:     BP 138/88 (BP Location: Left Arm, Patient Position: Sitting, Cuff Size: Normal)   Pulse 71   Temp 97.8 F (36.6 C) (Oral)   Resp 16   Ht 5\' 7"  (1.702 m)   Wt 173 lb (78.5 kg)   SpO2 96%   BMI 27.10 kg/m  Nursing note and vital signs reviewed.  Physical Exam  Constitutional: He is oriented to person, place, and time. He appears well-developed and well-nourished.  HENT:  Head: Normocephalic.  Right Ear: Hearing, tympanic membrane, external ear and ear canal normal.  Left Ear: Hearing, tympanic membrane, external ear and ear canal normal.  Nose: Nose normal.  Mouth/Throat: Uvula is midline, oropharynx is clear and moist and mucous membranes are normal.  Eyes: Pupils are equal, round, and reactive to light. Conjunctivae and EOM are normal.  Neck: Neck supple. No JVD present. No tracheal deviation present. No thyromegaly present.  Cardiovascular: Normal rate, regular rhythm, normal heart sounds and intact distal pulses.   Pulmonary/Chest: Effort normal and breath sounds normal.  Abdominal: Soft. Bowel sounds are normal. He exhibits no distension and no mass. There is no tenderness. There is no rebound and no guarding.  Musculoskeletal: Normal range of motion. He exhibits no edema or tenderness.  Lymphadenopathy:    He has no cervical adenopathy.  Neurological: He is alert and oriented to person, place, and time. He has normal reflexes. No cranial nerve  deficit. He exhibits normal muscle tone. Coordination normal.  Skin: Skin is warm and dry.  Psychiatric: He has a normal mood and affect. His behavior is normal. Judgment and thought content normal.       Assessment & Plan:   Problem List Items Addressed This Visit      Other   Routine general medical examination at a health care facility - Primary    1) Anticipatory Guidance: Discussed importance of wearing a seatbelt while driving and not texting while driving; changing batteries in smoke detector at least once annually; wearing suntan lotion when outside; eating a balanced and moderate diet; getting physical activity at least 30 minutes per day.  2) Immunizations / Screenings / Labs:  Pneumovax updated today. All other immunizations are up-to-date per recommendations. Colon cancer screening is up-to-date. Obtain PSA for prostate cancer screening. All other screenings are up-to-date per recommendations. Obtain CBC, CMET, and lipid profile.   Overall well exam  with risk factors for cardiovascular disease including hyperlipidemia. He exercises regularly and is of good body weight. Overall healthy exam. Continue healthy lifestyle behaviors and choices. Follow-up prevention exam in 1 year. Follow-up office visit pending blood work as needed.      Relevant Orders   CBC   Comprehensive metabolic panel   Lipid panel   PSA       I am having Mr. Mose maintain his methotrexate, folic acid, and pravastatin.   Follow-up: Return in about 1 year (around 03/16/2018), or if symptoms worsen or fail to improve.   Mauricio Po, FNP

## 2017-03-22 ENCOUNTER — Other Ambulatory Visit: Payer: Self-pay | Admitting: Family

## 2017-03-22 DIAGNOSIS — E785 Hyperlipidemia, unspecified: Secondary | ICD-10-CM

## 2017-04-05 DIAGNOSIS — L4050 Arthropathic psoriasis, unspecified: Secondary | ICD-10-CM | POA: Diagnosis not present

## 2017-07-06 DIAGNOSIS — L4050 Arthropathic psoriasis, unspecified: Secondary | ICD-10-CM | POA: Diagnosis not present

## 2017-09-28 DIAGNOSIS — L4050 Arthropathic psoriasis, unspecified: Secondary | ICD-10-CM | POA: Diagnosis not present

## 2017-10-01 ENCOUNTER — Encounter: Payer: Self-pay | Admitting: Nurse Practitioner

## 2017-10-01 ENCOUNTER — Other Ambulatory Visit: Payer: Self-pay | Admitting: Nurse Practitioner

## 2017-10-01 DIAGNOSIS — E785 Hyperlipidemia, unspecified: Secondary | ICD-10-CM

## 2017-10-01 MED ORDER — PRAVASTATIN SODIUM 20 MG PO TABS: ORAL_TABLET | ORAL | 1 refills | Status: DC

## 2017-10-01 NOTE — Telephone Encounter (Signed)
LOV:03/26/17 with Stanford Breed- pt has appt to transfer care on 01/05/18  [name]

## 2017-10-01 NOTE — Telephone Encounter (Signed)
Copied from [place] [id number]. Topic: Quick Communication - Rx Refill/Question >> Oct 01, 2017 11:06 AM Jason Hart wrote: Pt has scheduled an appt w/ [name] for transfer of care from [name], but is in need of his medication, contact pt to advise  Medication:  pravastatin (PRAVACHOL) 20 MG tablet [[id number]]    Has the patient contacted their pharmacy? Yes.     (Agent: If no, request that the patient contact the pharmacy for the refill.)   Preferred Pharmacy (with phone number or street name): [name]   Agent: Please be advised that RX refills may take up to 3 business days. We ask that you follow-up with your pharmacy.

## 2017-11-24 DIAGNOSIS — L570 Actinic keratosis: Secondary | ICD-10-CM | POA: Diagnosis not present

## 2017-11-24 DIAGNOSIS — D225 Melanocytic nevi of trunk: Secondary | ICD-10-CM | POA: Diagnosis not present

## 2017-11-24 DIAGNOSIS — D1801 Hemangioma of skin and subcutaneous tissue: Secondary | ICD-10-CM | POA: Diagnosis not present

## 2017-11-24 DIAGNOSIS — X32XXXD Exposure to sunlight, subsequent encounter: Secondary | ICD-10-CM | POA: Diagnosis not present

## 2017-11-24 DIAGNOSIS — L821 Other seborrheic keratosis: Secondary | ICD-10-CM | POA: Diagnosis not present

## 2017-12-24 DIAGNOSIS — L4050 Arthropathic psoriasis, unspecified: Secondary | ICD-10-CM | POA: Diagnosis not present

## 2018-01-05 ENCOUNTER — Ambulatory Visit (INDEPENDENT_AMBULATORY_CARE_PROVIDER_SITE_OTHER): Payer: BLUE CROSS/BLUE SHIELD | Admitting: Nurse Practitioner

## 2018-01-05 ENCOUNTER — Other Ambulatory Visit (INDEPENDENT_AMBULATORY_CARE_PROVIDER_SITE_OTHER): Payer: BLUE CROSS/BLUE SHIELD

## 2018-01-05 ENCOUNTER — Encounter: Payer: Self-pay | Admitting: Nurse Practitioner

## 2018-01-05 VITALS — BP 120/78 | HR 74 | Temp 97.8°F | Resp 16 | Ht 67.0 in | Wt 175.0 lb

## 2018-01-05 DIAGNOSIS — E785 Hyperlipidemia, unspecified: Secondary | ICD-10-CM

## 2018-01-05 DIAGNOSIS — Z0000 Encounter for general adult medical examination without abnormal findings: Secondary | ICD-10-CM

## 2018-01-05 DIAGNOSIS — Z125 Encounter for screening for malignant neoplasm of prostate: Secondary | ICD-10-CM | POA: Diagnosis not present

## 2018-01-05 LAB — LIPID PANEL
CHOL/HDL RATIO: 4
CHOLESTEROL: 168 mg/dL (ref 0–200)
HDL: 47.6 mg/dL (ref >39.00)
LDL CALC: 106 mg/dL — AB (ref 0–99)
NonHDL: 120.54
Triglycerides: 75 mg/dL (ref 0.0–149.0)
VLDL: 15 mg/dL (ref 0.0–40.0)

## 2018-01-05 LAB — COMPREHENSIVE METABOLIC PANEL
ALK PHOS: 60 U/L (ref 39–117)
ALT: 22 U/L (ref 0–53)
AST: 25 U/L (ref 0–37)
Albumin: 4.3 g/dL (ref 3.5–5.2)
BUN: 31 mg/dL — ABNORMAL HIGH (ref 6–23)
CHLORIDE: 104 meq/L (ref 96–112)
CO2: 32 mEq/L (ref 19–32)
Calcium: 9.3 mg/dL (ref 8.4–10.5)
Creatinine, Ser: 1.07 mg/dL (ref 0.40–1.50)
GFR: 72.27 mL/min (ref >60.00)
GLUCOSE: 91 mg/dL (ref 70–99)
POTASSIUM: 4.1 meq/L (ref 3.5–5.1)
SODIUM: 141 meq/L (ref 135–145)
TOTAL PROTEIN: 7 g/dL (ref 6.0–8.3)
Total Bilirubin: 0.8 mg/dL (ref 0.2–1.2)

## 2018-01-05 LAB — PSA: PSA: 1.09 ng/mL (ref 0.10–4.00)

## 2018-01-05 LAB — CBC
HEMATOCRIT: 45 % (ref 39.0–52.0)
HEMOGLOBIN: 15.4 g/dL (ref 13.0–17.0)
MCHC: 34.3 g/dL (ref 30.0–36.0)
MCV: 95.3 fl (ref 78.0–100.0)
PLATELETS: 228 10*3/uL (ref 150.0–400.0)
RBC: 4.73 Mil/uL (ref 4.22–5.81)
RDW: 13.6 % (ref 11.5–15.5)
WBC: 7.1 10*3/uL (ref 4.0–10.5)

## 2018-01-05 MED ORDER — PRAVASTATIN SODIUM 20 MG PO TABS: ORAL_TABLET | ORAL | 4 refills | Status: DC

## 2018-01-05 NOTE — Assessment & Plan Note (Signed)
-  Prostate cancer screening and PSA options (with potential risks and benefits of testing vs not testing) were discussed along with recent recs/guidelines. -USPSTF grade A and B recommendations reviewed with patient; age-appropriate recommendations, preventive care, screening tests, etc discussed and encouraged; healthy living and sunscreen use encouraged; see AVS for patient education given to patient -Discussed importance of 150 minutes of physical activity weekly,  eat 6 servings of fruit/vegetables daily and drink plenty of water and avoid sweet beverages.  -Follow up care and additional instructions printed on AVS  -Reviewed Health Maintenance: up to date  Screening for prostate cancer- PSA; Future

## 2018-01-05 NOTE — Progress Notes (Signed)
Name: Jason Hart   MRN: [id number]    DOB: 1946-07-31   Date:01/05/2018       Progress Note  Subjective  Chief Complaint  Chief Complaint  Patient presents with  . Establish Care    CPE     HPI Jason Hart is here today to establish care with me, transferring from another provider in the same practice. He is an active, independent 72 yo. He is also routinely following with rheumatology for psoriatic arthritis.  Patient presents for annual CPE.  USPSTF grade A and B recommendations:  Diet, Exercise: tries to watch his diet, walks and plays golf about 3 or more days per week  Depression: no concerns today Depression screen [facility] 2/9 03/16/2017 02/24/2016  Decreased Interest 0 0  Down, Depressed, Hopeless 0 0  PHQ - 2 Score 0 0   Hypertension: BP Readings from Last 3 Encounters:  01/05/18 120/78  03/16/17 138/88  02/24/16 130/72   Obesity: Wt Readings from Last 3 Encounters:  01/05/18 175 lb (79.4 kg)  03/16/17 173 lb (78.5 kg)  02/24/16 174 lb (78.9 kg)   BMI Readings from Last 3 Encounters:  01/05/18 27.41 kg/m  03/16/17 27.10 kg/m  02/24/16 27.25 kg/m   Cholesterol- maintained on pravastatin 20 daily Reports daily medication compliance without noted adverse medication effects including nausea, myalgias. He has been maintained on pravastatin for years No history of MI, CVA  Lab Results  Component Value Date   CHOL 153 03/16/2017   HDL 47.40 03/16/2017   LDLCALC 86 03/16/2017   LDLDIRECT 137.3 10/25/2009   TRIG 98.0 03/16/2017   CHOLHDL 3 03/16/2017   Glucose:  Glucose, Bld  Date Value Ref Range Status  03/16/2017 95 70 - 99 mg/dL Final  02/24/2016 97 70 - 99 mg/dL Final  02/19/2014 90 70 - 99 mg/dL Final   Alcohol: three beers per week Tobacco use: no, never  STD testing and prevention (chl/gon/syphilis):  Declines, no concerns Hep C: screening done  Skin cancer: wears sunscreen  Colorectal cancer: colonoscopy up to date. No personal or  family history of colon ca, no abdominal pain, no bowel changes, no rectal bleeding  Prostate cancer: PSA screening lab ordered today. Denies urinary frequency, hesitancy. Wakes once at night to pee.  Lab Results  Component Value Date   PSA 0.93 03/16/2017   PSA 1.19 02/24/2016   PSA 1.01 02/13/2015   Aspirin: does not take ASA ECG: not indicated  Vaccinations: up to date  Advanced Care Planning: A voluntary discussion about advance care planning including the explanation and discussion of advance directives.  Discussed health care proxy and Living will, and the patient DOES have a living will at present time. If patient does have living will, I have requested they bring this to the clinic to be scanned in to their chart.  Patient Active Problem List   Diagnosis Date Noted  . Routine general medical examination at a health care facility 02/24/2016  . Elevated blood pressure reading without diagnosis of hypertension 02/15/2014  . Psoriatic arthritis ([place]) 02/14/2013  . Metatarsalgia 02/12/2012  . Hyperlipidemia 10/25/2009  . Benign prostatic hyperplasia (BPH) with straining on urination 10/25/2009  . LOSS, CONDUCTIVE HEARING NOS 03/23/2007    Past Surgical History:  Procedure Laterality Date  . COLONOSCOPY  2006   Negative;due 2016; [facility]  . TONSILLECTOMY      Family History  Problem Relation Age of Onset  . Throat cancer Mother  smoker  . Hyperlipidemia Father   . Heart attack Father 66  . Sudden death Father   . Heart attack Paternal Grandfather 2  . Coronary artery disease Brother 62       stent  . Prostate cancer Brother 69  . Benign prostatic hyperplasia Brother        TURP  . Hypertension Neg Hx   . Diabetes Neg Hx   . Stroke Neg Hx   . Colon cancer Neg Hx   . Colon polyps Neg Hx   . Rectal cancer Neg Hx   . Stomach cancer Neg Hx     Social History   Socioeconomic History  . Marital status: Married    Spouse name: Not on file  . Number of  children: Not on file  . Years of education: Not on file  . Highest education level: Not on file  Occupational History  . Occupation: Scientist, clinical (histocompatibility and immunogenetics): [employer]  . Financial resource strain: Not on file  . Food insecurity:    Worry: Not on file    Inability: Not on file  . Transportation needs:    Medical: Not on file    Non-medical: Not on file  Tobacco Use  . Smoking status: Never Smoker  . Smokeless tobacco: Never Used  Substance and Sexual Activity  . Alcohol use: No    Alcohol/week: 0.0 oz    Comment: not since MTX started; occasional  . Drug use: No  . Sexual activity: Not on file  Lifestyle  . Physical activity:    Days per week: Not on file    Minutes per session: Not on file  . Stress: Not on file  Relationships  . Social connections:    Talks on phone: Not on file    Gets together: Not on file    Attends religious service: Not on file    Active member of club or organization: Not on file    Attends meetings of clubs or organizations: Not on file    Relationship status: Not on file  . Intimate partner violence:    Fear of current or ex partner: Not on file    Emotionally abused: Not on file    Physically abused: Not on file    Forced sexual activity: Not on file  Other Topics Concern  . Not on file  Social History Narrative   Fun/Hobby: Golfer      Current Outpatient Medications:  .  folic acid (FOLVITE) 1 MG tablet, Take 1 tablet (1 mg total) by mouth daily., Disp: 90 tablet, Rfl: 3 .  methotrexate (RHEUMATREX) 2.5 MG tablet, Take 2.5 mg by mouth once a week. Caution:Chemotherapy. Protect from light., Disp: , Rfl:  .  pravastatin (PRAVACHOL) 20 MG tablet, Take one tablet by mouth daily., Disp: 90 tablet, Rfl: 4  No Known Allergies   ROS  Constitutional: Negative for fever or weight change.  Respiratory: Negative for cough and shortness of breath.   Cardiovascular: Negative for chest pain or palpitations.   Gastrointestinal: Negative for abdominal pain, no bowel changes.  Musculoskeletal: Negative for gait problem or joint swelling.  Skin: Negative for rash.  Neurological: Negative for dizziness or headache.  No other specific complaints in a complete review of systems (except as listed in HPI above).   Objective  Vitals:   01/05/18 1320  BP: 120/78  Pulse: 74  Resp: 16  Temp: 97.8 F (36.6 C)  TempSrc: Oral  SpO2:  97%  Weight: 175 lb (79.4 kg)  Height: 5\' 7"  (1.702 m)    Body mass index is 27.41 kg/m.  Physical Exam Vital signs reviewed. Constitutional: Patient appears well-developed and well-nourished. No distress.  HENT: Head: Normocephalic and atraumatic. Ears: B TMs ok, no erythema or effusion; Nose: Nose normal. Mouth/Throat: Oropharynx is clear and moist. No oropharyngeal exudate.  Eyes: Conjunctivae and EOM are normal. Pupils are equal, round, and reactive to light. No scleral icterus.  Neck: Normal range of motion. Neck supple. No cervical adenopathy. No thyromegaly present.  Cardiovascular: Normal rate, regular rhythm and normal heart sounds.  No murmur heard. No BLE edema. Distal pulses intact. Pulmonary/Chest: Effort normal and breath sounds normal. No respiratory distress. Abdominal: Soft. Bowel sounds are normal, no distension. There is no tenderness. no masses Musculoskeletal: Normal range of motion, no joint effusions. No gross deformities Neurological: he is alert and oriented to person, place, and time. No cranial nerve deficit. Coordination, balance, strength, speech and gait are normal.  Skin: Skin is warm and dry. No rash noted. No erythema.  Psychiatric: Patient has a normal mood and affect. behavior is normal. Judgment and thought content normal.   Fall Risk: Fall Risk  03/16/2017 02/24/2016  Falls in the past year? No No     Assessment & Plan RTC in 1 year for CPE, or sooner if needed

## 2018-01-05 NOTE — Patient Instructions (Signed)
Please head downstairs for lab work/x-rays. If any of your test results are critically abnormal, you will be contacted right away. Your results may be released to your MyChart for viewing before I am able to provide you with my response. I will contact you within a week about your test results and any recommendations for abnormalities.  I will plan to see you back in 1 year, or sooner if needed.  It was nice to meet you. Thanks for letting me take care of you today.   Health Maintenance, Male A healthy lifestyle and preventive care is important for your health and wellness. Ask your health care provider about what schedule of regular examinations is right for you. What should I know about weight and diet? Eat a Healthy Diet  Eat plenty of vegetables, fruits, whole grains, low-fat dairy products, and lean protein.  Do not eat a lot of foods high in solid fats, added sugars, or salt.  Maintain a Healthy Weight Regular exercise can help you achieve or maintain a healthy weight. You should:  Do at least 150 minutes of exercise each week. The exercise should increase your heart rate and make you sweat (moderate-intensity exercise).  Do strength-training exercises at least twice a week.  Watch Your Levels of Cholesterol and Blood Lipids  Have your blood tested for lipids and cholesterol every 5 years starting at 72 years of age. If you are at high risk for heart disease, you should start having your blood tested when you are 72 years old. You may need to have your cholesterol levels checked more often if: ? Your lipid or cholesterol levels are high. ? You are older than 72 years of age. ? You are at high risk for heart disease.  What should I know about cancer screening? Many types of cancers can be detected early and may often be prevented. Lung Cancer  You should be screened every year for lung cancer if: ? You are a current smoker who has smoked for at least 30 years. ? You are a  former smoker who has quit within the past 15 years.  Talk to your health care provider about your screening options, when you should start screening, and how often you should be screened.  Colorectal Cancer  Routine colorectal cancer screening usually begins at 72 years of age and should be repeated every 5-10 years until you are 72 years old. You may need to be screened more often if early forms of precancerous polyps or small growths are found. Your health care provider may recommend screening at an earlier age if you have risk factors for colon cancer.  Your health care provider may recommend using home test kits to check for hidden blood in the stool.  A small camera at the end of a tube can be used to examine your colon (sigmoidoscopy or colonoscopy). This checks for the earliest forms of colorectal cancer.  Prostate and Testicular Cancer  Depending on your age and overall health, your health care provider may do certain tests to screen for prostate and testicular cancer.  Talk to your health care provider about any symptoms or concerns you have about testicular or prostate cancer.  Skin Cancer  Check your skin from head to toe regularly.  Tell your health care provider about any new moles or changes in moles, especially if: ? There is a change in a mole's size, shape, or color. ? You have a mole that is larger than a pencil eraser.  Always use sunscreen. Apply sunscreen liberally and repeat throughout the day.  Protect yourself by wearing long sleeves, pants, a wide-brimmed hat, and sunglasses when outside.  What should I know about heart disease, diabetes, and high blood pressure?  If you are 54-34 years of age, have your blood pressure checked every 3-5 years. If you are 91 years of age or older, have your blood pressure checked every year. You should have your blood pressure measured twice-once when you are at a hospital or clinic, and once when you are not at a hospital or  clinic. Record the average of the two measurements. To check your blood pressure when you are not at a hospital or clinic, you can use: ? An automated blood pressure machine at a pharmacy. ? A home blood pressure monitor.  Talk to your health care provider about your target blood pressure.  If you are between 41-47 years old, ask your health care provider if you should take aspirin to prevent heart disease.  Have regular diabetes screenings by checking your fasting blood sugar level. ? If you are at a normal weight and have a low risk for diabetes, have this test once every three years after the age of 47. ? If you are overweight and have a high risk for diabetes, consider being tested at a younger age or more often.  A one-time screening for abdominal aortic aneurysm (AAA) by ultrasound is recommended for men aged 49-75 years who are current or former smokers. What should I know about preventing infection? Hepatitis B If you have a higher risk for hepatitis B, you should be screened for this virus. Talk with your health care provider to find out if you are at risk for hepatitis B infection. Hepatitis C Blood testing is recommended for:  Everyone born from [birth year] through 1965.  Anyone with known risk factors for hepatitis C.  Sexually Transmitted Diseases (STDs)  You should be screened each year for STDs including gonorrhea and chlamydia if: ? You are sexually active and are younger than 72 years of age. ? You are older than 72 years of age and your health care provider tells you that you are at risk for this type of infection. ? Your sexual activity has changed since you were last screened and you are at an increased risk for chlamydia or gonorrhea. Ask your health care provider if you are at risk.  Talk with your health care provider about whether you are at high risk of being infected with HIV. Your health care provider may recommend a prescription medicine to help prevent HIV  infection.  What else can I do?  Schedule regular health, dental, and eye exams.  Stay current with your vaccines (immunizations).  Do not use any tobacco products, such as cigarettes, chewing tobacco, and e-cigarettes. If you need help quitting, ask your health care provider.  Limit alcohol intake to no more than 2 drinks per day. One drink equals 12 ounces of beer, 5 ounces of wine, or 1 ounces of hard liquor.  Do not use street drugs.  Do not share needles.  Ask your health care provider for help if you need support or information about quitting drugs.  Tell your health care provider if you often feel depressed.  Tell your health care provider if you have ever been abused or do not feel safe at home. This information is not intended to replace advice given to you by your health care provider. Make sure you discuss any  questions you have with your health care provider. Document Released: 01/23/2008 Document Revised: 03/25/2016 Document Reviewed: 04/30/2015 Elsevier Interactive Patient Education  [employer].

## 2018-01-05 NOTE — Assessment & Plan Note (Signed)
Continue pravastatin He is fasting Update lipid panel - CBC; Future - Comprehensive metabolic panel; Future - Lipid panel; Future - pravastatin (PRAVACHOL) 20 MG tablet; Take one tablet by mouth daily.  Dispense: 90 tablet; Refill: 4

## 2018-03-29 DIAGNOSIS — L4050 Arthropathic psoriasis, unspecified: Secondary | ICD-10-CM | POA: Diagnosis not present

## 2018-04-07 DIAGNOSIS — H524 Presbyopia: Secondary | ICD-10-CM | POA: Diagnosis not present

## 2018-04-07 DIAGNOSIS — H2513 Age-related nuclear cataract, bilateral: Secondary | ICD-10-CM | POA: Diagnosis not present

## 2018-06-22 DIAGNOSIS — D225 Melanocytic nevi of trunk: Secondary | ICD-10-CM | POA: Diagnosis not present

## 2018-06-22 DIAGNOSIS — Z1283 Encounter for screening for malignant neoplasm of skin: Secondary | ICD-10-CM | POA: Diagnosis not present

## 2018-06-29 DIAGNOSIS — L4050 Arthropathic psoriasis, unspecified: Secondary | ICD-10-CM | POA: Diagnosis not present

## 2018-09-29 DIAGNOSIS — L4050 Arthropathic psoriasis, unspecified: Secondary | ICD-10-CM | POA: Diagnosis not present

## 2018-09-29 DIAGNOSIS — Z6827 Body mass index (BMI) 27.0-27.9, adult: Secondary | ICD-10-CM | POA: Diagnosis not present

## 2018-09-29 DIAGNOSIS — E663 Overweight: Secondary | ICD-10-CM | POA: Diagnosis not present

## 2018-12-28 DIAGNOSIS — L4050 Arthropathic psoriasis, unspecified: Secondary | ICD-10-CM | POA: Diagnosis not present

## 2019-01-09 ENCOUNTER — Encounter: Payer: BLUE CROSS/BLUE SHIELD | Admitting: Nurse Practitioner

## 2019-02-06 ENCOUNTER — Telehealth: Payer: Self-pay | Admitting: Family

## 2019-02-06 DIAGNOSIS — E785 Hyperlipidemia, unspecified: Secondary | ICD-10-CM

## 2019-02-06 MED ORDER — PRAVASTATIN SODIUM 20 MG PO TABS: ORAL_TABLET | ORAL | 0 refills | Status: DC

## 2019-02-06 NOTE — Telephone Encounter (Signed)
He needs to be seen- last OV was in 12/2017; will send in Pravachol but no further refills after this.

## 2019-02-08 NOTE — Telephone Encounter (Signed)
OV scheduled with Dr. Jenny Reichmann 02/24/19 @ 10:00.

## 2019-02-24 ENCOUNTER — Ambulatory Visit (INDEPENDENT_AMBULATORY_CARE_PROVIDER_SITE_OTHER): Payer: Medicare HMO | Admitting: Internal Medicine

## 2019-02-24 ENCOUNTER — Other Ambulatory Visit: Payer: Self-pay

## 2019-02-24 ENCOUNTER — Other Ambulatory Visit (INDEPENDENT_AMBULATORY_CARE_PROVIDER_SITE_OTHER): Payer: Medicare HMO

## 2019-02-24 ENCOUNTER — Encounter: Payer: Self-pay | Admitting: Internal Medicine

## 2019-02-24 VITALS — BP 140/90 | HR 70 | Temp 97.8°F | Ht 67.0 in | Wt 176.0 lb

## 2019-02-24 DIAGNOSIS — E538 Deficiency of other specified B group vitamins: Secondary | ICD-10-CM

## 2019-02-24 DIAGNOSIS — R030 Elevated blood-pressure reading, without diagnosis of hypertension: Secondary | ICD-10-CM

## 2019-02-24 DIAGNOSIS — Z0000 Encounter for general adult medical examination without abnormal findings: Secondary | ICD-10-CM

## 2019-02-24 DIAGNOSIS — E611 Iron deficiency: Secondary | ICD-10-CM | POA: Diagnosis not present

## 2019-02-24 DIAGNOSIS — E559 Vitamin D deficiency, unspecified: Secondary | ICD-10-CM | POA: Diagnosis not present

## 2019-02-24 LAB — URINALYSIS, ROUTINE W REFLEX MICROSCOPIC
Bilirubin Urine: NEGATIVE
Hgb urine dipstick: NEGATIVE
Ketones, ur: NEGATIVE
Leukocytes,Ua: NEGATIVE
Nitrite: NEGATIVE
Specific Gravity, Urine: 1.015 (ref 1.000–1.030)
Total Protein, Urine: NEGATIVE
Urine Glucose: NEGATIVE
Urobilinogen, UA: 0.2 (ref 0.0–1.0)
pH: 6 (ref 5.0–8.0)

## 2019-02-24 LAB — BASIC METABOLIC PANEL
BUN: 30 mg/dL — ABNORMAL HIGH (ref 6–23)
CO2: 32 mEq/L (ref 19–32)
Calcium: 9.7 mg/dL (ref 8.4–10.5)
Chloride: 101 mEq/L (ref 96–112)
Creatinine, Ser: 1.08 mg/dL (ref 0.40–1.50)
GFR: 67.06 mL/min (ref >60.00)
Glucose, Bld: 88 mg/dL (ref 70–99)
Potassium: 4.2 mEq/L (ref 3.5–5.1)
Sodium: 139 mEq/L (ref 135–145)

## 2019-02-24 LAB — CBC WITH DIFFERENTIAL/PLATELET
Basophils Absolute: 0.1 10*3/uL (ref 0.0–0.1)
Basophils Relative: 1.1 % (ref 0.0–3.0)
Eosinophils Absolute: 0.1 10*3/uL (ref 0.0–0.7)
Eosinophils Relative: 1.6 % (ref 0.0–5.0)
HCT: 49 % (ref 39.0–52.0)
Hemoglobin: 16.4 g/dL (ref 13.0–17.0)
Lymphocytes Relative: 29.7 % (ref 12.0–46.0)
Lymphs Abs: 2.6 10*3/uL (ref 0.7–4.0)
MCHC: 33.4 g/dL (ref 30.0–36.0)
MCV: 97 fl (ref 78.0–100.0)
Monocytes Absolute: 0.7 10*3/uL (ref 0.1–1.0)
Monocytes Relative: 8.2 % (ref 3.0–12.0)
Neutro Abs: 5.1 10*3/uL (ref 1.4–7.7)
Neutrophils Relative %: 59.4 % (ref 43.0–77.0)
Platelets: 218 10*3/uL (ref 150.0–400.0)
RBC: 5.06 Mil/uL (ref 4.22–5.81)
RDW: 13.3 % (ref 11.5–15.5)
WBC: 8.7 10*3/uL (ref 4.0–10.5)

## 2019-02-24 LAB — LIPID PANEL
Cholesterol: 170 mg/dL (ref 0–200)
HDL: 47.7 mg/dL (ref >39.00)
LDL Cholesterol: 100 mg/dL — ABNORMAL HIGH (ref 0–99)
NonHDL: 122.18
Total CHOL/HDL Ratio: 4
Triglycerides: 113 mg/dL (ref 0.0–149.0)
VLDL: 22.6 mg/dL (ref 0.0–40.0)

## 2019-02-24 LAB — HEPATIC FUNCTION PANEL
ALT: 19 U/L (ref 0–53)
AST: 22 U/L (ref 0–37)
Albumin: 4.7 g/dL (ref 3.5–5.2)
Alkaline Phosphatase: 67 U/L (ref 39–117)
Bilirubin, Direct: 0.1 mg/dL (ref 0.0–0.3)
Total Bilirubin: 0.8 mg/dL (ref 0.2–1.2)
Total Protein: 7.1 g/dL (ref 6.0–8.3)

## 2019-02-24 LAB — IBC PANEL
Iron: 113 ug/dL (ref 42–165)
Saturation Ratios: 31.8 % (ref 20.0–50.0)
Transferrin: 254 mg/dL (ref 212.0–360.0)

## 2019-02-24 LAB — TSH: TSH: 2.27 u[IU]/mL (ref 0.35–4.50)

## 2019-02-24 LAB — VITAMIN D 25 HYDROXY (VIT D DEFICIENCY, FRACTURES): VITD: 50.76 ng/mL (ref 30.00–100.00)

## 2019-02-24 LAB — VITAMIN B12: Vitamin B-12: 352 pg/mL (ref 211–911)

## 2019-02-24 LAB — PSA: PSA: 1.09 ng/mL (ref 0.10–4.00)

## 2019-02-24 NOTE — Patient Instructions (Signed)

## 2019-02-24 NOTE — Progress Notes (Signed)
Subjective:    Patient ID: Jason Hart, male    DOB: [birth year], 73 y.o.   MRN: [id number]  HPI  Here for wellness and f/u;  Overall doing ok;  Pt denies Chest pain, worsening SOB, DOE, wheezing, orthopnea, PND, worsening LE edema, palpitations, dizziness or syncope.  Pt denies neurological change such as new headache, facial or extremity weakness.  Pt denies polydipsia, polyuria, or low sugar symptoms. Pt states overall good compliance with treatment and medications, good tolerability, and has been trying to follow appropriate diet.  Pt denies worsening depressive symptoms, suicidal ideation or panic. No fever, night sweats, wt loss, loss of appetite, or other constitutional symptoms.  Pt states good ability with ADL's, has low fall risk, home safety reviewed and adequate, no other significant changes in hearing or vision, and only occasionally active with exercise. Sees Dr Amil Amen with LFTs at least every 6 mo on MTX.  No new complaints Past Medical History:  Diagnosis Date  . Arthritis   . BPH (benign prostatic hypertrophy)   . Conductive hearing loss   . Hyperlipidemia   . Psoriatic arthritis ([place])   . Trigger point with temporomandibular joint pain    Past Surgical History:  Procedure Laterality Date  . COLONOSCOPY  2006   Negative;due 2016; [facility]  . TONSILLECTOMY      reports that he has never smoked. He has never used smokeless tobacco. He reports that he does not drink alcohol or use drugs. family history includes Benign prostatic hyperplasia in his brother; Coronary artery disease (age of onset: 36) in his brother; Heart attack (age of onset: 82) in his father; Heart attack (age of onset: 75) in his paternal grandfather; Hyperlipidemia in his father; Prostate cancer (age of onset: 49) in his brother; Sudden death in his father; Throat cancer in his mother. No Known Allergies Current Outpatient Medications on File Prior to Visit  Medication Sig Dispense Refill  . folic  acid (FOLVITE) 1 MG tablet Take 1 tablet (1 mg total) by mouth daily. 90 tablet 3  . methotrexate (RHEUMATREX) 2.5 MG tablet Take 2.5 mg by mouth once a week. Caution:Chemotherapy. Protect from light.    . pravastatin (PRAVACHOL) 20 MG tablet Take one tablet by mouth daily. 90 tablet 0   No current facility-administered medications on file prior to visit.    Review of Systems Constitutional: Negative for other unusual diaphoresis, sweats, appetite or weight changes HENT: Negative for other worsening hearing loss, ear pain, facial swelling, mouth sores or neck stiffness.   Eyes: Negative for other worsening pain, redness or other visual disturbance.  Respiratory: Negative for other stridor or swelling Cardiovascular: Negative for other palpitations or other chest pain  Gastrointestinal: Negative for worsening diarrhea or loose stools, blood in stool, distention or other pain Genitourinary: Negative for hematuria, flank pain or other change in urine volume.  Musculoskeletal: Negative for myalgias or other joint swelling.  Skin: Negative for other color change, or other wound or worsening drainage.  Neurological: Negative for other syncope or numbness. Hematological: Negative for other adenopathy or swelling Psychiatric/Behavioral: Negative for hallucinations, other worsening agitation, SI, self-injury, or new decreased concentration All other system neg per pt    Objective:   Physical Exam BP 140/90 (BP Location: Left Arm, Patient Position: Sitting, Cuff Size: Normal)   Pulse 70   Temp 97.8 F (36.6 C) (Oral)   Ht 5\' 7"  (1.702 m)   Wt 176 lb (79.8 kg)   SpO2 97%  BMI 27.57 kg/m  VS noted,  Constitutional: Pt is oriented to person, place, and time. Appears well-developed and well-nourished, in no significant distress and comfortable Head: Normocephalic and atraumatic  Eyes: Conjunctivae and EOM are normal. Pupils are equal, round, and reactive to light Right Ear: External ear normal  without discharge Left Ear: External ear normal without discharge Nose: Nose without discharge or deformity Mouth/Throat: Oropharynx is without other ulcerations and moist  Neck: Normal range of motion. Neck supple. No JVD present. No tracheal deviation present or significant neck LA or mass Cardiovascular: Normal rate, regular rhythm, normal heart sounds and intact distal pulses.   Pulmonary/Chest: WOB normal and breath sounds without rales or wheezing  Abdominal: Soft. Bowel sounds are normal. NT. No HSM  Musculoskeletal: Normal range of motion. Exhibits no edema Lymphadenopathy: Has no other cervical adenopathy.  Neurological: Pt is alert and oriented to person, place, and time. Pt has normal reflexes. No cranial nerve deficit. Motor grossly intact, Gait intact Skin: Skin is warm and dry. No rash noted or new ulcerations Psychiatric:  Has normal mood and affect. Behavior is normal without agitation No other exam findings Lab Results  Component Value Date   WBC 8.7 02/24/2019   HGB 16.4 02/24/2019   HCT 49.0 02/24/2019   PLT 218.0 02/24/2019   GLUCOSE 88 02/24/2019   CHOL 170 02/24/2019   TRIG 113.0 02/24/2019   HDL 47.70 02/24/2019   LDLDIRECT 137.3 10/25/2009   LDLCALC 100 (H) 02/24/2019   ALT 19 02/24/2019   AST 22 02/24/2019   NA 139 02/24/2019   K 4.2 02/24/2019   CL 101 02/24/2019   CREATININE 1.08 02/24/2019   BUN 30 (H) 02/24/2019   CO2 32 02/24/2019   TSH 2.27 02/24/2019   PSA 1.09 02/24/2019   HGBA1C 5.4 04/19/2008        Assessment & Plan:

## 2019-02-25 ENCOUNTER — Encounter: Payer: Self-pay | Admitting: Internal Medicine

## 2019-02-25 NOTE — Assessment & Plan Note (Signed)

## 2019-02-25 NOTE — Assessment & Plan Note (Signed)
BP Readings from Last 3 Encounters:  02/24/19 140/90  01/05/18 120/78  03/16/17 138/88  borderline elevated today, cont to monitor at home, consider tx for > 140/90

## 2019-03-30 DIAGNOSIS — L4050 Arthropathic psoriasis, unspecified: Secondary | ICD-10-CM | POA: Diagnosis not present

## 2019-04-10 DIAGNOSIS — H524 Presbyopia: Secondary | ICD-10-CM | POA: Diagnosis not present

## 2019-04-10 DIAGNOSIS — H2513 Age-related nuclear cataract, bilateral: Secondary | ICD-10-CM | POA: Diagnosis not present

## 2019-04-20 DIAGNOSIS — R69 Illness, unspecified: Secondary | ICD-10-CM | POA: Diagnosis not present

## 2019-04-26 ENCOUNTER — Ambulatory Visit (INDEPENDENT_AMBULATORY_CARE_PROVIDER_SITE_OTHER): Payer: Medicare HMO | Admitting: Internal Medicine

## 2019-04-26 ENCOUNTER — Encounter: Payer: Self-pay | Admitting: Internal Medicine

## 2019-04-26 DIAGNOSIS — L4050 Arthropathic psoriasis, unspecified: Secondary | ICD-10-CM | POA: Diagnosis not present

## 2019-04-26 DIAGNOSIS — M545 Low back pain, unspecified: Secondary | ICD-10-CM

## 2019-04-26 DIAGNOSIS — R030 Elevated blood-pressure reading, without diagnosis of hypertension: Secondary | ICD-10-CM | POA: Diagnosis not present

## 2019-04-26 DIAGNOSIS — E785 Hyperlipidemia, unspecified: Secondary | ICD-10-CM | POA: Diagnosis not present

## 2019-04-26 MED ORDER — TIZANIDINE HCL 2 MG PO TABS: 2.0000 mg | ORAL_TABLET | Freq: Four times a day (QID) | ORAL | 0 refills | Status: DC | PRN

## 2019-04-26 NOTE — Assessment & Plan Note (Signed)
stable overall by history and exam, recent data reviewed with pt, and pt to continue medical treatment as before,  to f/u any worsening symptoms or concerns  

## 2019-04-26 NOTE — Patient Instructions (Signed)
Please take all new medication as prescribed  Please continue all other medications as before, and refills have been done if requested.  Please have the pharmacy call with any other refills you may need.  Please continue your efforts at being more active, low cholesterol diet, and weight control.  Please keep your appointments with your specialists as you may have planned    

## 2019-04-26 NOTE — Progress Notes (Signed)
Patient ID: Jason Hart, male   DOB: 1946/01/13, 73 y.o.   MRN: [id number]  Virtual Visit via Video Note  I connected with Jason Hart on 04/26/19 at 11:20 AM EDT by a video enabled telemedicine application and verified that I am speaking with the correct person using two identifiers.  Location: Patient: at home Provider: at office   I discussed the limitations of evaluation and management by telemedicine and the availability of in person appointments. The patient expressed understanding and agreed to proceed.  History of Present Illness: Pt c/o recurring LBP without bowel or bladder change, fever, wt loss,  worsening LE pain/numbness/weakness, gait change or falls, but is mild to mod, intermittent, particularly worse yesterday, spent day in bed yesterday, better with advil, now better today overall with mild pain. Nothing else makes better or worse  Pt denies chest pain, increased sob or doe, wheezing, orthopnea, PND, increased LE swelling, palpitations, dizziness or syncope.  Pt denies new neurological symptoms such as new headache, or facial or extremity weakness or numbness  Pt denies polydipsia, polyuria  No other joint pain  BP at home < 140/90 Past Medical History:  Diagnosis Date  . Arthritis   . BPH (benign prostatic hypertrophy)   . Conductive hearing loss   . Hyperlipidemia   . Psoriatic arthritis ([place])   . Trigger point with temporomandibular joint pain    Past Surgical History:  Procedure Laterality Date  . COLONOSCOPY  2006   Negative;due 2016; [facility]  . TONSILLECTOMY      reports that he has never smoked. He has never used smokeless tobacco. He reports that he does not drink alcohol or use drugs. family history includes Benign prostatic hyperplasia in his brother; Coronary artery disease (age of onset: 33) in his brother; Heart attack (age of onset: 43) in his father; Heart attack (age of onset: 7) in his paternal grandfather; Hyperlipidemia in his  father; Prostate cancer (age of onset: 3) in his brother; Sudden death in his father; Throat cancer in his mother. No Known Allergies Current Outpatient Medications on File Prior to Visit  Medication Sig Dispense Refill  . folic acid (FOLVITE) 1 MG tablet Take 1 tablet (1 mg total) by mouth daily. 90 tablet 3  . methotrexate (RHEUMATREX) 2.5 MG tablet Take 2.5 mg by mouth once a week. Caution:Chemotherapy. Protect from light.    . pravastatin (PRAVACHOL) 20 MG tablet Take one tablet by mouth daily. 90 tablet 0   No current facility-administered medications on file prior to visit.     Observations/Objective: Alert, NAD, appropriate mood and affect, resps normal, cn 2-12 intact, moves all 4s, no visible rash or swelling Lab Results  Component Value Date   WBC 8.7 02/24/2019   HGB 16.4 02/24/2019   HCT 49.0 02/24/2019   PLT 218.0 02/24/2019   GLUCOSE 88 02/24/2019   CHOL 170 02/24/2019   TRIG 113.0 02/24/2019   HDL 47.70 02/24/2019   LDLDIRECT 137.3 10/25/2009   LDLCALC 100 (H) 02/24/2019   ALT 19 02/24/2019   AST 22 02/24/2019   NA 139 02/24/2019   K 4.2 02/24/2019   CL 101 02/24/2019   CREATININE 1.08 02/24/2019   BUN 30 (H) 02/24/2019   CO2 32 02/24/2019   TSH 2.27 02/24/2019   PSA 1.09 02/24/2019   HGBA1C 5.4 04/19/2008   Assessment and Plan: See notes  Follow Up Instructions: See notes   I discussed the assessment and treatment plan with the patient. The patient was  provided an opportunity to ask questions and all were answered. The patient agreed with the plan and demonstrated an understanding of the instructions.   The patient was advised to call back or seek an in-person evaluation if the symptoms worsen or if the condition fails to improve as anticipated.   Cathlean Cower, MD

## 2019-04-26 NOTE — Assessment & Plan Note (Signed)
C/w msk strain, for advil prn, tizanidine prn,  to f/u any worsening symptoms or concerns

## 2019-05-08 ENCOUNTER — Other Ambulatory Visit: Payer: Self-pay

## 2019-05-08 ENCOUNTER — Ambulatory Visit (INDEPENDENT_AMBULATORY_CARE_PROVIDER_SITE_OTHER): Payer: Medicare HMO

## 2019-05-08 DIAGNOSIS — Z23 Encounter for immunization: Secondary | ICD-10-CM | POA: Diagnosis not present

## 2019-05-10 ENCOUNTER — Telehealth: Payer: Self-pay | Admitting: Internal Medicine

## 2019-05-10 DIAGNOSIS — M545 Low back pain, unspecified: Secondary | ICD-10-CM

## 2019-05-10 DIAGNOSIS — G8929 Other chronic pain: Secondary | ICD-10-CM

## 2019-05-10 NOTE — Telephone Encounter (Signed)
Ok this is done Jason Hart to please inform pt,

## 2019-05-10 NOTE — Telephone Encounter (Signed)
Pt saw dr Jenny Reichmann on 04-26-2019 for back pain the patient back is no better he would like to proceed with mri

## 2019-05-10 NOTE — Addendum Note (Signed)
Addended by: Biagio Borg on: 05/10/2019 03:45 PM   Modules accepted: Orders

## 2019-05-11 NOTE — Telephone Encounter (Signed)
Pt has been informed.

## 2019-05-23 ENCOUNTER — Other Ambulatory Visit: Payer: Self-pay | Admitting: Family

## 2019-05-23 DIAGNOSIS — E785 Hyperlipidemia, unspecified: Secondary | ICD-10-CM

## 2019-06-21 DIAGNOSIS — Z20828 Contact with and (suspected) exposure to other viral communicable diseases: Secondary | ICD-10-CM | POA: Diagnosis not present

## 2019-06-21 DIAGNOSIS — L4050 Arthropathic psoriasis, unspecified: Secondary | ICD-10-CM | POA: Diagnosis not present

## 2019-07-05 DIAGNOSIS — R69 Illness, unspecified: Secondary | ICD-10-CM | POA: Diagnosis not present

## 2019-07-10 DIAGNOSIS — Z0100 Encounter for examination of eyes and vision without abnormal findings: Secondary | ICD-10-CM | POA: Diagnosis not present

## 2019-07-12 DIAGNOSIS — Z1283 Encounter for screening for malignant neoplasm of skin: Secondary | ICD-10-CM | POA: Diagnosis not present

## 2019-07-12 DIAGNOSIS — L570 Actinic keratosis: Secondary | ICD-10-CM | POA: Diagnosis not present

## 2019-07-12 DIAGNOSIS — X32XXXD Exposure to sunlight, subsequent encounter: Secondary | ICD-10-CM | POA: Diagnosis not present

## 2019-07-12 DIAGNOSIS — L821 Other seborrheic keratosis: Secondary | ICD-10-CM | POA: Diagnosis not present

## 2019-09-01 ENCOUNTER — Ambulatory Visit: Payer: Medicare HMO | Attending: Internal Medicine

## 2019-09-01 DIAGNOSIS — Z23 Encounter for immunization: Secondary | ICD-10-CM | POA: Insufficient documentation

## 2019-09-19 ENCOUNTER — Ambulatory Visit: Payer: Medicare HMO | Attending: Internal Medicine

## 2019-09-19 DIAGNOSIS — Z23 Encounter for immunization: Secondary | ICD-10-CM | POA: Insufficient documentation

## 2019-09-19 NOTE — Progress Notes (Signed)
   Covid-19 Vaccination Clinic  Name:  Jason Hart    MRN: [id number] DOB: 21-May-1946  09/19/2019  Jason Hart was observed post Covid-19 immunization for 15 minutes without incidence. He was provided with Vaccine Information Sheet and instruction to access the V-Safe system.   Jason Hart was instructed to call 911 with any severe reactions post vaccine: Marland Kitchen Difficulty breathing  . Swelling of your face and throat  . A fast heartbeat  . A bad rash all over your body  . Dizziness and weakness    Immunizations Administered    Name Date Dose VIS Date Route   Pfizer COVID-19 Vaccine 09/19/2019  8:23 AM 0.3 mL 07/21/2019 Intramuscular   Manufacturer: [place]   Lot: [id number]   [place]: [id number]

## 2019-10-02 DIAGNOSIS — Z6827 Body mass index (BMI) 27.0-27.9, adult: Secondary | ICD-10-CM | POA: Diagnosis not present

## 2019-10-02 DIAGNOSIS — L4050 Arthropathic psoriasis, unspecified: Secondary | ICD-10-CM | POA: Diagnosis not present

## 2019-10-02 DIAGNOSIS — E663 Overweight: Secondary | ICD-10-CM | POA: Diagnosis not present

## 2019-10-24 DIAGNOSIS — R69 Illness, unspecified: Secondary | ICD-10-CM | POA: Diagnosis not present

## 2020-01-01 DIAGNOSIS — D225 Melanocytic nevi of trunk: Secondary | ICD-10-CM | POA: Diagnosis not present

## 2020-01-01 DIAGNOSIS — L4050 Arthropathic psoriasis, unspecified: Secondary | ICD-10-CM | POA: Diagnosis not present

## 2020-01-01 DIAGNOSIS — L821 Other seborrheic keratosis: Secondary | ICD-10-CM | POA: Diagnosis not present

## 2020-01-05 ENCOUNTER — Other Ambulatory Visit: Payer: Self-pay | Admitting: Internal Medicine

## 2020-01-05 DIAGNOSIS — E785 Hyperlipidemia, unspecified: Secondary | ICD-10-CM

## 2020-01-05 NOTE — Telephone Encounter (Signed)
Please refill as per office routine med refill policy (all routine meds refilled for 3 mo or monthly per pt preference up to one year from last visit, then month to month grace period for 3 mo, then further med refills will have to be denied)  

## 2020-02-21 ENCOUNTER — Telehealth (INDEPENDENT_AMBULATORY_CARE_PROVIDER_SITE_OTHER): Payer: Medicare HMO | Admitting: Family

## 2020-02-21 ENCOUNTER — Ambulatory Visit: Payer: Medicare HMO | Attending: Internal Medicine

## 2020-02-21 DIAGNOSIS — B349 Viral infection, unspecified: Secondary | ICD-10-CM | POA: Diagnosis not present

## 2020-02-21 DIAGNOSIS — Z20822 Contact with and (suspected) exposure to covid-19: Secondary | ICD-10-CM | POA: Diagnosis not present

## 2020-02-21 NOTE — Progress Notes (Signed)
Jason Hart is a 74 y.o. male with the following history as recorded in Epic[employer]:  Patient Active Problem List   Diagnosis Date Noted  . Low back pain 04/26/2019  . Routine general medical examination at a health care facility 02/24/2016  . Elevated blood pressure reading without diagnosis of hypertension 02/15/2014  . Psoriatic arthritis ([place]) 02/14/2013  . Metatarsalgia 02/12/2012  . Hyperlipidemia 10/25/2009  . Benign prostatic hyperplasia (BPH) with straining on urination 10/25/2009  . LOSS, CONDUCTIVE HEARING NOS 03/23/2007    Current Outpatient Medications  Medication Sig Dispense Refill  . folic acid (FOLVITE) 1 MG tablet Take 1 tablet (1 mg total) by mouth daily. 90 tablet 3  . methotrexate (RHEUMATREX) 2.5 MG tablet Take 2.5 mg by mouth once a week. Caution:Chemotherapy. Protect from light.    . pravastatin (PRAVACHOL) 20 MG tablet TAKE ONE TABLET BY MOUTH DAILY 90 tablet 0  . tiZANidine (ZANAFLEX) 2 MG tablet Take 1 tablet (2 mg total) by mouth every 6 (six) hours as needed for muscle spasms. 40 tablet 0   No current facility-administered medications for this visit.    Allergies: Patient has no known allergies.  Past Medical History:  Diagnosis Date  . Arthritis   . BPH (benign prostatic hypertrophy)   . Conductive hearing loss   . Hyperlipidemia   . Psoriatic arthritis ([place])   . Trigger point with temporomandibular joint pain     Past Surgical History:  Procedure Laterality Date  . COLONOSCOPY  2006   Negative;due 2016; [facility]  . TONSILLECTOMY      Family History  Problem Relation Age of Onset  . Throat cancer Mother        smoker  . Hyperlipidemia Father   . Heart attack Father 27  . Sudden death Father   . Heart attack Paternal Grandfather 87  . Coronary artery disease Brother 62       stent  . Prostate cancer Brother 73  . Benign prostatic hyperplasia Brother        TURP  . Hypertension Neg Hx   . Diabetes Neg Hx   . Stroke Neg Hx   .  Colon cancer Neg Hx   . Colon polyps Neg Hx   . Rectal cancer Neg Hx   . Stomach cancer Neg Hx     Social History   Tobacco Use  . Smoking status: Never Smoker  . Smokeless tobacco: Never Used  Substance Use Topics  . Alcohol use: No    Alcohol/week: 0.0 standard drinks    Comment: not since MTX started; occasional    Subjective:    I connected with Jason Hart on 02/21/20 at 12:40 PM EDT by a video enabled telemedicine application and verified that I am speaking with the correct person using two identifiers.   I discussed the limitations of evaluation and management by telemedicine and the availability of in person appointments. The patient expressed understanding and agreed to proceed. Provider in office/ patient is at home; provider and patient are only 2 people on video call.   Started Saturday evening with "flu-like symptoms" and cold chills; temperature is averaging between 100-102; is fully vaccinated against COVID but has not had recent COVID test; notes he is feeling very bad/ tired/ weak;    Objective:  There were no vitals filed for this visit.  General: Well developed, well nourished, in no acute distress  Head: Normocephalic and atraumatic  Lungs: Respirations unlabored;  Neurologic: Alert and oriented;  speech intact;   Assessment:  1. Viral illness     Plan:  Am concerned for pneumonia or COVID; patient's appearance on screen is concerning and recommend in office evaluation at [facility] or ER; patient agrees and will go to [facility] later today; follow-up to be determined.   No follow-ups on file.  No orders of the defined types were placed in this encounter.   Requested Prescriptions    No prescriptions requested or ordered in this encounter

## 2020-02-22 LAB — NOVEL CORONAVIRUS, NAA: SARS-CoV-2, NAA: NOT DETECTED

## 2020-02-22 LAB — SARS-COV-2, NAA 2 DAY TAT

## 2020-02-23 ENCOUNTER — Emergency Department (HOSPITAL_COMMUNITY): Payer: Medicare HMO

## 2020-02-23 ENCOUNTER — Encounter (HOSPITAL_COMMUNITY): Payer: Self-pay

## 2020-02-23 ENCOUNTER — Inpatient Hospital Stay (HOSPITAL_COMMUNITY)
Admission: EM | Admit: 2020-02-23 | Discharge: 2020-02-27 | DRG: 871 | Disposition: A | Discharge status: Home / Self Care | Payer: Medicare HMO | Attending: Internal Medicine | Admitting: Internal Medicine

## 2020-02-23 ENCOUNTER — Other Ambulatory Visit: Payer: Self-pay

## 2020-02-23 DIAGNOSIS — N401 Enlarged prostate with lower urinary tract symptoms: Secondary | ICD-10-CM | POA: Diagnosis present

## 2020-02-23 DIAGNOSIS — R000 Tachycardia, unspecified: Secondary | ICD-10-CM | POA: Diagnosis not present

## 2020-02-23 DIAGNOSIS — S0990XA Unspecified injury of head, initial encounter: Secondary | ICD-10-CM | POA: Diagnosis not present

## 2020-02-23 DIAGNOSIS — J189 Pneumonia, unspecified organism: Secondary | ICD-10-CM

## 2020-02-23 DIAGNOSIS — N289 Disorder of kidney and ureter, unspecified: Secondary | ICD-10-CM | POA: Diagnosis not present

## 2020-02-23 DIAGNOSIS — R42 Dizziness and giddiness: Secondary | ICD-10-CM | POA: Diagnosis not present

## 2020-02-23 DIAGNOSIS — R296 Repeated falls: Secondary | ICD-10-CM | POA: Diagnosis not present

## 2020-02-23 DIAGNOSIS — E860 Dehydration: Secondary | ICD-10-CM | POA: Diagnosis present

## 2020-02-23 DIAGNOSIS — Z83438 Family history of other disorder of lipoprotein metabolism and other lipidemia: Secondary | ICD-10-CM | POA: Diagnosis not present

## 2020-02-23 DIAGNOSIS — A481 Legionnaires' disease: Secondary | ICD-10-CM | POA: Diagnosis present

## 2020-02-23 DIAGNOSIS — R350 Frequency of micturition: Secondary | ICD-10-CM | POA: Diagnosis present

## 2020-02-23 DIAGNOSIS — E861 Hypovolemia: Secondary | ICD-10-CM | POA: Diagnosis present

## 2020-02-23 DIAGNOSIS — S199XXA Unspecified injury of neck, initial encounter: Secondary | ICD-10-CM | POA: Diagnosis not present

## 2020-02-23 DIAGNOSIS — H902 Conductive hearing loss, unspecified: Secondary | ICD-10-CM | POA: Diagnosis present

## 2020-02-23 DIAGNOSIS — E871 Hypo-osmolality and hyponatremia: Secondary | ICD-10-CM | POA: Diagnosis not present

## 2020-02-23 DIAGNOSIS — R29810 Facial weakness: Secondary | ICD-10-CM | POA: Diagnosis not present

## 2020-02-23 DIAGNOSIS — M1990 Unspecified osteoarthritis, unspecified site: Secondary | ICD-10-CM | POA: Diagnosis present

## 2020-02-23 DIAGNOSIS — R531 Weakness: Secondary | ICD-10-CM | POA: Diagnosis not present

## 2020-02-23 DIAGNOSIS — M6282 Rhabdomyolysis: Secondary | ICD-10-CM | POA: Diagnosis not present

## 2020-02-23 DIAGNOSIS — Z20822 Contact with and (suspected) exposure to covid-19: Secondary | ICD-10-CM | POA: Diagnosis present

## 2020-02-23 DIAGNOSIS — A419 Sepsis, unspecified organism: Principal | ICD-10-CM | POA: Diagnosis present

## 2020-02-23 DIAGNOSIS — E785 Hyperlipidemia, unspecified: Secondary | ICD-10-CM | POA: Diagnosis present

## 2020-02-23 DIAGNOSIS — L4050 Arthropathic psoriasis, unspecified: Secondary | ICD-10-CM | POA: Diagnosis not present

## 2020-02-23 DIAGNOSIS — R0902 Hypoxemia: Secondary | ICD-10-CM | POA: Diagnosis not present

## 2020-02-23 LAB — COMPREHENSIVE METABOLIC PANEL
ALT: 36 U/L (ref 0–44)
AST: 63 U/L — ABNORMAL HIGH (ref 15–41)
Albumin: 2.8 g/dL — ABNORMAL LOW (ref 3.5–5.0)
Alkaline Phosphatase: 106 U/L (ref 38–126)
Anion gap: 9 (ref 5–15)
BUN: 19 mg/dL (ref 8–23)
CO2: 24 mmol/L (ref 22–32)
Calcium: 8.1 mg/dL — ABNORMAL LOW (ref 8.9–10.3)
Chloride: 93 mmol/L — ABNORMAL LOW (ref 98–111)
Creatinine, Ser: 1.26 mg/dL — ABNORMAL HIGH (ref 0.61–1.24)
GFR calc Af Amer: >60 mL/min (ref >60)
GFR calc non Af Amer: 56 mL/min — ABNORMAL LOW (ref >60)
Glucose, Bld: 122 mg/dL — ABNORMAL HIGH (ref 70–99)
Potassium: 4.2 mmol/L (ref 3.5–5.1)
Sodium: 126 mmol/L — ABNORMAL LOW (ref 135–145)
Total Bilirubin: 1 mg/dL (ref 0.3–1.2)
Total Protein: 6.2 g/dL — ABNORMAL LOW (ref 6.5–8.1)

## 2020-02-23 LAB — CBC WITH DIFFERENTIAL/PLATELET
Abs Immature Granulocytes: 0.1 10*3/uL — ABNORMAL HIGH (ref 0.00–0.07)
Basophils Absolute: 0 10*3/uL (ref 0.0–0.1)
Basophils Relative: 0 %
Eosinophils Absolute: 0 10*3/uL (ref 0.0–0.5)
Eosinophils Relative: 0 %
HCT: 43.4 % (ref 39.0–52.0)
Hemoglobin: 14.7 g/dL (ref 13.0–17.0)
Immature Granulocytes: 1 %
Lymphocytes Relative: 4 %
Lymphs Abs: 0.5 10*3/uL — ABNORMAL LOW (ref 0.7–4.0)
MCH: 32.5 pg (ref 26.0–34.0)
MCHC: 33.9 g/dL (ref 30.0–36.0)
MCV: 95.8 fL (ref 80.0–100.0)
Monocytes Absolute: 0.7 10*3/uL (ref 0.1–1.0)
Monocytes Relative: 6 %
Neutro Abs: 10.3 10*3/uL — ABNORMAL HIGH (ref 1.7–7.7)
Neutrophils Relative %: 89 %
Platelets: 216 10*3/uL (ref 150–400)
RBC: 4.53 MIL/uL (ref 4.22–5.81)
RDW: 12.4 % (ref 11.5–15.5)
WBC: 11.6 10*3/uL — ABNORMAL HIGH (ref 4.0–10.5)
nRBC: 0 % (ref 0.0–0.2)

## 2020-02-23 LAB — LACTIC ACID, PLASMA
Lactic Acid, Venous: 1.5 mmol/L (ref 0.5–1.9)
Lactic Acid, Venous: 1.2 mmol/L (ref 0.5–1.9)

## 2020-02-23 LAB — URINALYSIS, ROUTINE W REFLEX MICROSCOPIC
Bilirubin Urine: NEGATIVE
Glucose, UA: NEGATIVE mg/dL
Ketones, ur: 5 mg/dL — AB
Leukocytes,Ua: NEGATIVE
Nitrite: NEGATIVE
Protein, ur: 100 mg/dL — AB
Specific Gravity, Urine: 1.026 (ref 1.005–1.030)
pH: 5 (ref 5.0–8.0)

## 2020-02-23 LAB — PROTIME-INR
INR: 1.2 (ref 0.8–1.2)
Prothrombin Time: 14.4 seconds (ref 11.4–15.2)

## 2020-02-23 LAB — SARS CORONAVIRUS 2 BY RT PCR (HOSPITAL ORDER, PERFORMED IN ~~LOC~~ HOSPITAL LAB): SARS Coronavirus 2: NEGATIVE

## 2020-02-23 LAB — APTT: aPTT: 44 seconds — ABNORMAL HIGH (ref 24–36)

## 2020-02-23 LAB — CK: Total CK: 2502 U/L — ABNORMAL HIGH (ref 49–397)

## 2020-02-23 MED ORDER — SODIUM CHLORIDE 0.9 % IV SOLN
2.0000 g | INTRAVENOUS | Status: DC
  Administered 2020-02-23: 2 g via INTRAVENOUS
  Filled 2020-02-23: qty 20

## 2020-02-23 MED ORDER — ACETAMINOPHEN 500 MG PO TABS
1000.0000 mg | ORAL_TABLET | Freq: Once | ORAL | Status: AC
  Administered 2020-02-23: 1000 mg via ORAL
  Filled 2020-02-23: qty 2

## 2020-02-23 MED ORDER — SODIUM CHLORIDE 0.9 % IV SOLN
500.0000 mg | INTRAVENOUS | Status: DC
  Administered 2020-02-23: 500 mg via INTRAVENOUS
  Filled 2020-02-23: qty 500

## 2020-02-23 MED ORDER — SODIUM CHLORIDE 0.9 % IV BOLUS
1000.0000 mL | Freq: Once | INTRAVENOUS | Status: AC
  Administered 2020-02-23: 1000 mL via INTRAVENOUS

## 2020-02-23 NOTE — H&P (Signed)
History and Physical    TAYVIAN HOLYCROSS MEQ:[id number] DOB: Oct 20, 1945 DOA: 02/23/2020  PCP: Biagio Borg, MD   Patient coming from: Home   Chief Complaint: Fevers, malaise, falls   HPI: Jason Hart is a 74 y.o. male with medical history significant for psoriatic arthritis, now presenting to the emergency department with fevers, general weakness and malaise, and multiple falls.  Patient reports that his symptoms began on 02/17/2020 with chills and general malaise.  Since then, he has gone on to develop fevers and worsening general weakness with multiple falls.  He denies losing consciousness with these falls or experiencing any chest pain or palpitations, but explains that he has been feeling profoundly weak in general at times.  He has had a mild cough and dyspnea, no chest pain or abdominal pain, no flank pain, and no vomiting or diarrhea.  He also reports a poor appetite recently.  ED Course: Upon arrival to the ED, patient is found to be febrile to 39.9 C, saturating 90% on room air while at rest, and with stable blood pressure.  EKG features a sinus rhythm.  Chest x-ray concerning for right lower lobe pneumonia.  Head CT is negative for acute intracranial abnormality and there is no acute abnormality noted on cervical spine CT.  Chemistry panel features a sodium of 126, AST 63, and creatinine 1.26, up from 1.08 a year ago.  CK is elevated to 2500.  CBC with mild leukocytosis.  Lactic acid reassuringly normal.  COVID-19 PCR is negative.  Urinalysis features "large hemoglobin" without RBCs on microscopy.  Blood and urine cultures were collected in the emergency department and the patient was given a liter of saline, Rocephin, azithromycin, and acetaminophen.  Review of Systems:  All other systems reviewed and apart from HPI, are negative.  Past Medical History:  Diagnosis Date   Arthritis    BPH (benign prostatic hypertrophy)    Conductive hearing loss    Hyperlipidemia     Psoriatic arthritis ([place])    Trigger point with temporomandibular joint pain     Past Surgical History:  Procedure Laterality Date   COLONOSCOPY  2006   Negative;due 2016; [facility]   TONSILLECTOMY       reports that he has never smoked. He has never used smokeless tobacco. He reports that he does not drink alcohol and does not use drugs.  No Known Allergies  Family History  Problem Relation Age of Onset   Throat cancer Mother        smoker   Hyperlipidemia Father    Heart attack Father 33   Sudden death Father    Heart attack Paternal Grandfather 22   Coronary artery disease Brother 38       stent   Prostate cancer Brother 65   Benign prostatic hyperplasia Brother        TURP   Hypertension Neg Hx    Diabetes Neg Hx    Stroke Neg Hx    Colon cancer Neg Hx    Colon polyps Neg Hx    Rectal cancer Neg Hx    Stomach cancer Neg Hx      Prior to Admission medications   Medication Sig Start Date End Date Taking? Authorizing Provider  folic acid (FOLVITE) 1 MG tablet Take 1 tablet (1 mg total) by mouth daily. Patient taking differently: Take 1 mg by mouth once a week.  03/05/15  Yes Hendricks Limes, MD  methotrexate (RHEUMATREX) 2.5 MG tablet Take 10  mg by mouth once a week. Caution:Chemotherapy. Protect from light.    Yes [provider]  pravastatin (PRAVACHOL) 20 MG tablet TAKE ONE TABLET BY MOUTH DAILY 01/09/20  Yes Biagio Borg, MD  tiZANidine (ZANAFLEX) 2 MG tablet Take 1 tablet (2 mg total) by mouth every 6 (six) hours as needed for muscle spasms. 04/26/19  Yes Biagio Borg, MD    Physical Exam: Vitals:   02/23/20 1633 02/23/20 1640 02/23/20 1800 02/23/20 2028  BP:   119/64   Pulse: 86 83 70   Resp: 16 20 14    Temp:    98.1 F (36.7 C)  TempSrc:    Oral  SpO2: 92% 91% 90%     Constitutional: NAD, calm  Eyes: PERTLA, lids and conjunctivae normal ENMT: Mucous membranes are moist. Posterior pharynx clear of any exudate or lesions.    Neck: normal, supple, no masses, no thyromegaly Respiratory: no wheezing, no crackles. No accessory muscle use.  Cardiovascular: S1 & S2 heard, regular rate and rhythm. No extremity edema.   Abdomen: No distension, no tenderness, soft. Bowel sounds active.  Musculoskeletal: no clubbing / cyanosis. No joint deformity upper and lower extremities.   Skin: no significant rashes, lesions, ulcers. Warm, dry, well-perfused. Neurologic: CN 2-12 grossly intact. Sensation intact. Strength 5/5 in all 4 limbs.  Psychiatric: Alert and oriented to person, place, and situation. Very pleasant and cooperative.    Labs and Imaging on Admission: I have personally reviewed following labs and imaging studies  CBC: Recent Labs  Lab 02/23/20 1541  WBC 11.6*  NEUTROABS 10.3*  HGB 14.7  HCT 43.4  MCV 95.8  PLT 209   Basic Metabolic Panel: Recent Labs  Lab 02/23/20 1541  NA 126*  K 4.2  CL 93*  CO2 24  GLUCOSE 122*  BUN 19  CREATININE 1.26*  CALCIUM 8.1*   GFR: CrCl cannot be calculated (Unknown ideal weight.). Liver Function Tests: Recent Labs  Lab 02/23/20 1541  AST 63*  ALT 36  ALKPHOS 106  BILITOT 1.0  PROT 6.2*  ALBUMIN 2.8*   No results for input(s): LIPASE, AMYLASE in the last 168 hours. No results for input(s): AMMONIA in the last 168 hours. Coagulation Profile: Recent Labs  Lab 02/23/20 1541  INR 1.2   Cardiac Enzymes: Recent Labs  Lab 02/23/20 1541  CKTOTAL 2,502*   BNP (last 3 results) No results for input(s): PROBNP in the last 8760 hours. HbA1C: No results for input(s): HGBA1C in the last 72 hours. CBG: No results for input(s): GLUCAP in the last 168 hours. Lipid Profile: No results for input(s): CHOL, HDL, LDLCALC, TRIG, CHOLHDL, LDLDIRECT in the last 72 hours. Thyroid Function Tests: No results for input(s): TSH, T4TOTAL, FREET4, T3FREE, THYROIDAB in the last 72 hours. Anemia Panel: No results for input(s): VITAMINB12, FOLATE, FERRITIN, TIBC, IRON,  RETICCTPCT in the last 72 hours. Urine analysis:    Component Value Date/Time   COLORURINE AMBER (A) 02/23/2020 1735   APPEARANCEUR HAZY (A) 02/23/2020 1735   LABSPEC 1.026 02/23/2020 1735   PHURINE 5.0 02/23/2020 1735   GLUCOSEU NEGATIVE 02/23/2020 1735   GLUCOSEU NEGATIVE 02/24/2019 1109   HGBUR LARGE (A) 02/23/2020 1735   BILIRUBINUR NEGATIVE 02/23/2020 1735   KETONESUR 5 (A) 02/23/2020 1735   PROTEINUR 100 (A) 02/23/2020 1735   UROBILINOGEN 0.2 02/24/2019 1109   NITRITE NEGATIVE 02/23/2020 1735   LEUKOCYTESUR NEGATIVE 02/23/2020 1735   Sepsis Labs: @LABRCNTIP (procalcitonin:4,lacticidven:4) ) Recent Results (from the past 240 hour(s))  Novel Coronavirus,  NAA ([facility])     Status: None   Collection Time: 02/21/20  1:52 PM   Specimen: Nasopharyngeal(NP) swabs in vial transport medium   Nasopharynge  Testing  Result Value Ref Range Status   SARS-CoV-2, NAA Not Detected Not Detected Final    Comment: This nucleic acid amplification test was developed and its performance characteristics determined by [employer]. Nucleic acid amplification tests include RT-PCR and TMA. This test has not been FDA cleared or approved. This test has been authorized by FDA under an Emergency Use Authorization (EUA). This test is only authorized for the duration of time the declaration that circumstances exist justifying the authorization of the emergency use of in vitro diagnostic tests for detection of SARS-CoV-2 virus and/or diagnosis of COVID-19 infection under section 564(b)(1) of the Act, 21 U.S.C. 502DXA-1(O) (1), unless the authorization is terminated or revoked sooner. When diagnostic testing is negative, the possibility of a false negative result should be considered in the context of a patient's recent exposures and the presence of clinical signs and symptoms consistent with COVID-19. An individual without symptoms of COVID-19 and who is not shedding SARS-CoV-2 virus wo uld  expect to have a negative (not detected) result in this assay.   SARS-COV-2, NAA 2 DAY TAT     Status: None   Collection Time: 02/21/20  1:52 PM   Nasopharynge  Testing  Result Value Ref Range Status   SARS-CoV-2, NAA 2 DAY TAT Performed  Final  SARS Coronavirus 2 by RT PCR (hospital order, performed in [address] hospital lab) Nasopharyngeal Nasopharyngeal Swab     Status: None   Collection Time: 02/23/20  3:06 PM   Specimen: Nasopharyngeal Swab  Result Value Ref Range Status   SARS Coronavirus 2 NEGATIVE NEGATIVE Final    Comment: (NOTE) SARS-CoV-2 target nucleic acids are NOT DETECTED.  The SARS-CoV-2 RNA is generally detectable in upper and lower respiratory specimens during the acute phase of infection. The lowest concentration of SARS-CoV-2 viral copies this assay can detect is 250 copies / mL. A negative result does not preclude SARS-CoV-2 infection and should not be used as the sole basis for treatment or other patient management decisions.  A negative result may occur with improper specimen collection / handling, submission of specimen other than nasopharyngeal swab, presence of viral mutation(s) within the areas targeted by this assay, and inadequate number of viral copies (<250 copies / mL). A negative result must be combined with clinical observations, patient history, and epidemiological information.  Fact Sheet for Patients:   StrictlyIdeas.no  Fact Sheet for Healthcare Providers: BankingDealers.co.za  This test is not yet approved or  cleared by the [place] FDA and has been authorized for detection and/or diagnosis of SARS-CoV-2 by FDA under an Emergency Use Authorization (EUA).  This EUA will remain in effect (meaning this test can be used) for the duration of the COVID-19 declaration under Section 564(b)(1) of the Act, 21 U.S.C. section 360bbb-3(b)(1), unless the authorization is terminated or revoked  sooner.  Performed at [facility], [place] [address], [place], [place] [address]      Radiological Exams on Admission: CT Head Wo Contrast  Result Date: 02/23/2020 CLINICAL DATA:  Fall. EXAM: CT HEAD WITHOUT CONTRAST CT CERVICAL SPINE WITHOUT CONTRAST TECHNIQUE: Multidetector CT imaging of the head and cervical spine was performed following the standard protocol without intravenous contrast. Multiplanar CT image reconstructions of the cervical spine were also generated. COMPARISON:  None. FINDINGS: CT HEAD FINDINGS Brain: Mild chronic  ischemic white matter disease is noted. No mass effect or midline shift is noted. Ventricular size is within normal limits. There is no evidence of mass lesion, hemorrhage or acute infarction. Vascular: No hyperdense vessel or unexpected calcification. Skull: Normal. Negative for fracture or focal lesion. Sinuses/Orbits: No acute finding. Other: None. CT CERVICAL SPINE FINDINGS Alignment: Normal. Skull base and vertebrae: No acute fracture. No primary bone lesion or focal pathologic process. Soft tissues and spinal canal: No prevertebral fluid or swelling. No visible canal hematoma. Disc levels:  Severe degenerative disc disease is noted at C7-T1. Upper chest: Negative. Other: None. IMPRESSION: 1. Mild chronic ischemic white matter disease. No acute intracranial abnormality seen. 2. Severe degenerative disc disease is noted at C7-T1. No acute abnormality seen in the cervical spine. Electronically Signed   By: Marijo Conception M.D.   On: 02/23/2020 19:05   CT Cervical Spine Wo Contrast  Result Date: 02/23/2020 CLINICAL DATA:  Fall. EXAM: CT HEAD WITHOUT CONTRAST CT CERVICAL SPINE WITHOUT CONTRAST TECHNIQUE: Multidetector CT imaging of the head and cervical spine was performed following the standard protocol without intravenous contrast. Multiplanar CT image reconstructions of the cervical spine were also generated. COMPARISON:  None. FINDINGS: CT HEAD FINDINGS Brain:  Mild chronic ischemic white matter disease is noted. No mass effect or midline shift is noted. Ventricular size is within normal limits. There is no evidence of mass lesion, hemorrhage or acute infarction. Vascular: No hyperdense vessel or unexpected calcification. Skull: Normal. Negative for fracture or focal lesion. Sinuses/Orbits: No acute finding. Other: None. CT CERVICAL SPINE FINDINGS Alignment: Normal. Skull base and vertebrae: No acute fracture. No primary bone lesion or focal pathologic process. Soft tissues and spinal canal: No prevertebral fluid or swelling. No visible canal hematoma. Disc levels:  Severe degenerative disc disease is noted at C7-T1. Upper chest: Negative. Other: None. IMPRESSION: 1. Mild chronic ischemic white matter disease. No acute intracranial abnormality seen. 2. Severe degenerative disc disease is noted at C7-T1. No acute abnormality seen in the cervical spine. Electronically Signed   By: Marijo Conception M.D.   On: 02/23/2020 19:05   DG Chest Port 1 View  Result Date: 02/23/2020 CLINICAL DATA:  Fever, weakness EXAM: PORTABLE CHEST 1 VIEW COMPARISON:  None. FINDINGS: There is right basilar consolidation most in keeping with changes of acute lobar pneumonia in the a appropriate clinical setting. Left lung is clear. No pneumothorax or pleural effusion. Cardiac size is within normal limits IMPRESSION: Right lower lobar pneumonia. Electronically Signed   By: Fidela Salisbury MD   On: 02/23/2020 16:17    EKG: Independently reviewed. Sinus rhythm.   Assessment/Plan   1. Sepsis secondary to PNA  - Presents with several days of general weakness, fevers, malaise, and slight cough and is found to be febrile with leukocytosis and RLL pneumonia on CXR  - Blood cultures were collected in ED and he was started on Rocephin and azithromycin  - Culture sputum, check strep pneumo and legionella antigens, trend procalcitonin, and continue Rocephin and azithromycin    2. Hyponatremia  -  Serum sodium is 126 on admission in setting of hypovolemia  - He was given a liter of NS in ED  - Continue NS infusion and repeat chem panel   3. Rhabdomyolysis  - CK is elevated 2500 with mild renal insufficiency and "large Hgb" on UA but no RBC on microscopy  - He was given a liter of NS in ED  - Hold statin, continue IVF hydration, monitor  renal function    4. Mild renal insufficiency  - SCr is 1.26 on admission, up from 1.08 a year ago  - Renally-dose medications, monitor with IVF hydration    5. Psoriatic arthritis  - Patient reports this to be well-controlled    6. Falls  - Patient reports multiple falls recently due to general weakness  - There are no acute neuro deficits on exam, no acute findings on head CT, and this is likely secondary to the acute infectious illness with hyponatremia  - Anticipate improvement with continued IVF hydration and antibiotics, may need PT assessment prior to discharge    DVT prophylaxis: Lovenox  Code Status: Full  Family Communication: Discussed with patient  Disposition Plan:  Patient is from: Home  Anticipated d/c is to: TBD Anticipated d/c date is: 7/18 or 02/26/20 Patient currently: Initiating treatment for PNA, should be managed inpatient as supported by PORT/PSI score 98 Consults called: None  Admission status: Inpatient     Vianne Bulls, MD [facility]  02/23/2020, 8:48 PM

## 2020-02-23 NOTE — ED Triage Notes (Signed)
Pt BIB EMS from home due to weakness x4 days.Pt also reports fevers at home around 102.63F. Pt reports that he has been taking tylenol for fevers. Pt reports he was sitting on the coach where he fell off. Pt was tested for covid and was negative. Pt reports he hit his head on the ottoman. Pt denies any loc just weak at the knees.

## 2020-02-23 NOTE — ED Provider Notes (Signed)
[facility] Provider Note   CSN: [id number] Arrival date & time: 02/23/20  1437     History Chief Complaint  Patient presents with  . Weakness    Jason Hart is a 74 y.o. male.  HPI    Pt is a 74 y/o male with a h/o arthritis, BPH, conductive hearing loss, hyperlipidemia, psoriatic arthritis, who presents to the emergency department today for evaluation of generalized weakness.  Patient complains of generalized weakness for the last several days.  He has have intermittent fevers at home up to 102F.  He is also had multiple falls at home and reports head trauma but no LOC.  He fell just prior to arrival and was unable to get up on his own due to weakness.  States he was on the floor for about an hour.  Denies headache or other neuro complaints.  No lateralizing symptoms.  Denies any chest pain, shortness of breath, cough, abdominal pain, nausea, vomiting, diarrhea, dysuria.  He does report some urinary frequency.  He does not know if he has been around anybody with Covid but is fully vaccinated based on chart review.  States he was tested for Covid 3 days ago after being seen by his PCP but the test was negative.  Pts wife passed away about 2 months ago.   Past Medical History:  Diagnosis Date  . Arthritis   . BPH (benign prostatic hypertrophy)   . Conductive hearing loss   . Hyperlipidemia   . Psoriatic arthritis ([place])   . Trigger point with temporomandibular joint pain     Patient Active Problem List   Diagnosis Date Noted  . Pneumonia 02/23/2020  . Hyponatremia 02/23/2020  . Rhabdomyolysis 02/23/2020  . Mild renal insufficiency 02/23/2020  . Low back pain 04/26/2019  . Routine general medical examination at a health care facility 02/24/2016  . Elevated blood pressure reading without diagnosis of hypertension 02/15/2014  . Psoriatic arthritis ([place]) 02/14/2013  . Metatarsalgia 02/12/2012  . Hyperlipidemia 10/25/2009  . Benign  prostatic hyperplasia (BPH) with straining on urination 10/25/2009  . LOSS, CONDUCTIVE HEARING NOS 03/23/2007    Past Surgical History:  Procedure Laterality Date  . COLONOSCOPY  2006   Negative;due 2016; [facility]  . TONSILLECTOMY         Family History  Problem Relation Age of Onset  . Throat cancer Mother        smoker  . Hyperlipidemia Father   . Heart attack Father 36  . Sudden death Father   . Heart attack Paternal Grandfather 46  . Coronary artery disease Brother 62       stent  . Prostate cancer Brother 5  . Benign prostatic hyperplasia Brother        TURP  . Hypertension Neg Hx   . Diabetes Neg Hx   . Stroke Neg Hx   . Colon cancer Neg Hx   . Colon polyps Neg Hx   . Rectal cancer Neg Hx   . Stomach cancer Neg Hx     Social History   Tobacco Use  . Smoking status: Never Smoker  . Smokeless tobacco: Never Used  Vaping Use  . Vaping Use: Never used  Substance Use Topics  . Alcohol use: No    Alcohol/week: 0.0 standard drinks    Comment: not since MTX started; occasional  . Drug use: No    Home Medications Prior to Admission medications   Medication Sig Start Date End Date  Taking? Authorizing Provider  folic acid (FOLVITE) 1 MG tablet Take 1 tablet (1 mg total) by mouth daily. Patient taking differently: Take 1 mg by mouth once a week.  03/05/15  Yes Hendricks Limes, MD  methotrexate (RHEUMATREX) 2.5 MG tablet Take 10 mg by mouth once a week. Caution:Chemotherapy. Protect from light.    Yes [provider]  pravastatin (PRAVACHOL) 20 MG tablet TAKE ONE TABLET BY MOUTH DAILY 01/09/20  Yes Biagio Borg, MD  tiZANidine (ZANAFLEX) 2 MG tablet Take 1 tablet (2 mg total) by mouth every 6 (six) hours as needed for muscle spasms. 04/26/19  Yes Biagio Borg, MD    Allergies    Patient has no known allergies.  Review of Systems   Review of Systems  Constitutional: Positive for fever.  HENT: Negative for ear pain and sore throat.   Eyes: Negative  for visual disturbance.  Respiratory: Negative for cough and shortness of breath.   Cardiovascular: Negative for chest pain.  Gastrointestinal: Negative for abdominal pain, constipation, diarrhea, nausea and vomiting.  Genitourinary: Positive for frequency. Negative for dysuria and hematuria.  Musculoskeletal: Negative for back pain.  Skin: Negative for rash.  Neurological: Positive for dizziness and weakness (generally weak). Negative for numbness.       Head injury, no LOC  All other systems reviewed and are negative.   Physical Exam Updated Vital Signs BP 119/64   Pulse 70   Temp (!) 103.9 F (39.9 C) (Rectal)   Resp 14   SpO2 90%   Physical Exam Vitals and nursing note reviewed.  Constitutional:      Appearance: He is well-developed.  HENT:     Head: Normocephalic and atraumatic.  Eyes:     Conjunctiva/sclera: Conjunctivae normal.  Cardiovascular:     Rate and Rhythm: Normal rate and regular rhythm.     Heart sounds: Normal heart sounds. No murmur heard.   Pulmonary:     Effort: Pulmonary effort is normal. No respiratory distress.     Breath sounds: Normal breath sounds. No wheezing, rhonchi or rales.  Abdominal:     General: Bowel sounds are normal.     Palpations: Abdomen is soft.     Tenderness: There is no abdominal tenderness. There is no guarding or rebound.  Musculoskeletal:     Cervical back: Neck supple.     Comments: No CTL spine TTP  Skin:    General: Skin is warm and dry.  Neurological:     Mental Status: He is alert.     Comments: Mental Status:  Alert, thought content appropriate, able to give a coherent history. Speech fluent without evidence of aphasia. Able to follow 2 step commands without difficulty.  Cranial Nerves:  II:  pupils equal, round, reactive to light III,IV, VI: ptosis not present, extra-ocular motions intact bilaterally  V,VII: smile symmetric, facial light touch sensation equal VIII: hearing grossly normal to voice  X: uvula  elevates symmetrically  XI: bilateral shoulder shrug symmetric and strong XII: midline tongue extension without fassiculations Motor:  Normal tone. 5/5 strength of BUE and BLE major muscle groups including strong and equal grip strength and dorsiflexion/plantar flexion Sensory: light touch normal in all extremities.     ED Results / Procedures / Treatments   Labs (all labs ordered are listed, but only abnormal results are displayed) Labs Reviewed  COMPREHENSIVE METABOLIC PANEL - Abnormal; Notable for the following components:      Result Value   Sodium 126 (*)  Chloride 93 (*)    Glucose, Bld 122 (*)    Creatinine, Ser 1.26 (*)    Calcium 8.1 (*)    Total Protein 6.2 (*)    Albumin 2.8 (*)    AST 63 (*)    GFR calc non Af Amer 56 (*)    All other components within normal limits  CBC WITH DIFFERENTIAL/PLATELET - Abnormal; Notable for the following components:   WBC 11.6 (*)    Neutro Abs 10.3 (*)    Lymphs Abs 0.5 (*)    Abs Immature Granulocytes 0.10 (*)    All other components within normal limits  APTT - Abnormal; Notable for the following components:   aPTT 44 (*)    All other components within normal limits  URINALYSIS, ROUTINE W REFLEX MICROSCOPIC - Abnormal; Notable for the following components:   Color, Urine AMBER (*)    APPearance HAZY (*)    Hgb urine dipstick LARGE (*)    Ketones, ur 5 (*)    Protein, ur 100 (*)    Bacteria, UA FEW (*)    All other components within normal limits  CK - Abnormal; Notable for the following components:   Total CK 2,502 (*)    All other components within normal limits  SARS CORONAVIRUS 2 BY RT PCR (HOSPITAL ORDER, [facility])  CULTURE, BLOOD (ROUTINE X 2)  CULTURE, BLOOD (ROUTINE X 2)  URINE CULTURE  LACTIC ACID, PLASMA  LACTIC ACID, PLASMA  PROTIME-INR    EKG EKG Interpretation  Date/Time:  Friday February 23 2020 14:42:35 EDT Ventricular Rate:  79 PR Interval:    QRS Duration: 93 QT  Interval:  346 QTC Calculation: 397 R Axis:   56 Text Interpretation: Sinus rhythm Probable left atrial enlargement RSR' in V1 or V2, right VCD or RVH No STEMI Confirmed by Octaviano Glow [phone]) on 02/23/2020 3:01:40 PM   Radiology CT Head Wo Contrast  Result Date: 02/23/2020 CLINICAL DATA:  Fall. EXAM: CT HEAD WITHOUT CONTRAST CT CERVICAL SPINE WITHOUT CONTRAST TECHNIQUE: Multidetector CT imaging of the head and cervical spine was performed following the standard protocol without intravenous contrast. Multiplanar CT image reconstructions of the cervical spine were also generated. COMPARISON:  None. FINDINGS: CT HEAD FINDINGS Brain: Mild chronic ischemic white matter disease is noted. No mass effect or midline shift is noted. Ventricular size is within normal limits. There is no evidence of mass lesion, hemorrhage or acute infarction. Vascular: No hyperdense vessel or unexpected calcification. Skull: Normal. Negative for fracture or focal lesion. Sinuses/Orbits: No acute finding. Other: None. CT CERVICAL SPINE FINDINGS Alignment: Normal. Skull base and vertebrae: No acute fracture. No primary bone lesion or focal pathologic process. Soft tissues and spinal canal: No prevertebral fluid or swelling. No visible canal hematoma. Disc levels:  Severe degenerative disc disease is noted at C7-T1. Upper chest: Negative. Other: None. IMPRESSION: 1. Mild chronic ischemic white matter disease. No acute intracranial abnormality seen. 2. Severe degenerative disc disease is noted at C7-T1. No acute abnormality seen in the cervical spine. Electronically Signed   By: Marijo Conception M.D.   On: 02/23/2020 19:05   CT Cervical Spine Wo Contrast  Result Date: 02/23/2020 CLINICAL DATA:  Fall. EXAM: CT HEAD WITHOUT CONTRAST CT CERVICAL SPINE WITHOUT CONTRAST TECHNIQUE: Multidetector CT imaging of the head and cervical spine was performed following the standard protocol without intravenous contrast. Multiplanar CT image  reconstructions of the cervical spine were also generated. COMPARISON:  None. FINDINGS: CT  HEAD FINDINGS Brain: Mild chronic ischemic white matter disease is noted. No mass effect or midline shift is noted. Ventricular size is within normal limits. There is no evidence of mass lesion, hemorrhage or acute infarction. Vascular: No hyperdense vessel or unexpected calcification. Skull: Normal. Negative for fracture or focal lesion. Sinuses/Orbits: No acute finding. Other: None. CT CERVICAL SPINE FINDINGS Alignment: Normal. Skull base and vertebrae: No acute fracture. No primary bone lesion or focal pathologic process. Soft tissues and spinal canal: No prevertebral fluid or swelling. No visible canal hematoma. Disc levels:  Severe degenerative disc disease is noted at C7-T1. Upper chest: Negative. Other: None. IMPRESSION: 1. Mild chronic ischemic white matter disease. No acute intracranial abnormality seen. 2. Severe degenerative disc disease is noted at C7-T1. No acute abnormality seen in the cervical spine. Electronically Signed   By: Marijo Conception M.D.   On: 02/23/2020 19:05   DG Chest Port 1 View  Result Date: 02/23/2020 CLINICAL DATA:  Fever, weakness EXAM: PORTABLE CHEST 1 VIEW COMPARISON:  None. FINDINGS: There is right basilar consolidation most in keeping with changes of acute lobar pneumonia in the a appropriate clinical setting. Left lung is clear. No pneumothorax or pleural effusion. Cardiac size is within normal limits IMPRESSION: Right lower lobar pneumonia. Electronically Signed   By: Fidela Salisbury MD   On: 02/23/2020 16:17    Procedures Procedures (including critical care time)  Medications Ordered in ED Medications  cefTRIAXone (ROCEPHIN) 2 g in sodium chloride 0.9 % 100 mL IVPB (0 g Intravenous Stopped 02/23/20 1833)  azithromycin (ZITHROMAX) 500 mg in sodium chloride 0.9 % 250 mL IVPB (0 mg Intravenous Stopped 02/23/20 1931)  acetaminophen (TYLENOL) tablet 1,000 mg (1,000 mg Oral Given  02/23/20 1527)  sodium chloride 0.9 % bolus 1,000 mL (1,000 mLs Intravenous New Bag/Given 02/23/20 1907)    ED Course  I have reviewed the triage vital signs and the nursing notes.  Pertinent labs & imaging results that were available during my care of the patient were reviewed by me and considered in my medical decision making (see chart for details).  Clinical Course as of Feb 23 2011  Fri Feb 23, 2020  1500 74 yo male presenting to ED with fatigue, fever.  Found to be febrile on arrival.  Xray right right lobar PNA.  Lactate 1.5.  With his age & hypoxia, I anticipate admission & treatment for CAP with rocephin & azthromycin.  Care in ED taken over by evening team at 3 pm with plan to f/u labs, give IV antibiotics.   [MT]    Clinical Course User Index [MT] Trifan, Carola Rhine, MD   MDM Rules/Calculators/A&P                          74 year old male presenting for evaluation of generalized weakness for the last several days.  Has had multiple falls at home and is found to be febrile on arrival here.  Reviewed/interpreted labs CBC showed a mild leukocytosis CMP with hyponatremia and hypochloremia, mild AKI with sodium of 1.26.  Slightly elevated AST.  No elevated bilirubin and normal anion gap.  - hyponatremia likely from dehydration - IVF given Lactic acid and coags negative. Covid negative CK is elevated at 2500  -IV fluids UA with hematuria, ketonuria, proteinuria and few bacteria.  No evidence for UTI.  Chest x-ray personally reviewed/interpreted and showed a right lobar pneumonia.  -Patient given ceftriaxone and azithromycin.  CT head/cervical spine also  reviewed/interpreted which showed no acute traumatic injuries  We will plan for admission for further treatment of pneumonia, hyponatremia, dehydration and early rhabdomyolysis.  8:12 PM CONSULT with Dr. Myna Hidalgo who accepts patient for admission.  Final Clinical Impression(s) / ED Diagnoses Final diagnoses:  Community  acquired pneumonia of right lower lobe of lung    Rx / DC Orders ED Discharge Orders    None       Bishop Dublin 02/23/20 2012    Wyvonnia Dusky, MD 02/24/20 [phone]

## 2020-02-24 LAB — CBC WITH DIFFERENTIAL/PLATELET
Abs Immature Granulocytes: 0.09 10*3/uL — ABNORMAL HIGH (ref 0.00–0.07)
Basophils Absolute: 0 10*3/uL (ref 0.0–0.1)
Basophils Relative: 0 %
Eosinophils Absolute: 0 10*3/uL (ref 0.0–0.5)
Eosinophils Relative: 0 %
HCT: 41.6 % (ref 39.0–52.0)
Hemoglobin: 14 g/dL (ref 13.0–17.0)
Immature Granulocytes: 1 %
Lymphocytes Relative: 5 %
Lymphs Abs: 0.6 10*3/uL — ABNORMAL LOW (ref 0.7–4.0)
MCH: 32 pg (ref 26.0–34.0)
MCHC: 33.7 g/dL (ref 30.0–36.0)
MCV: 95 fL (ref 80.0–100.0)
Monocytes Absolute: 0.5 10*3/uL (ref 0.1–1.0)
Monocytes Relative: 4 %
Neutro Abs: 10.8 10*3/uL — ABNORMAL HIGH (ref 1.7–7.7)
Neutrophils Relative %: 90 %
Platelets: 202 10*3/uL (ref 150–400)
RBC: 4.38 MIL/uL (ref 4.22–5.81)
RDW: 12.5 % (ref 11.5–15.5)
WBC: 12.1 10*3/uL — ABNORMAL HIGH (ref 4.0–10.5)
nRBC: 0 % (ref 0.0–0.2)

## 2020-02-24 LAB — COMPREHENSIVE METABOLIC PANEL
ALT: 40 U/L (ref 0–44)
AST: 76 U/L — ABNORMAL HIGH (ref 15–41)
Albumin: 2.3 g/dL — ABNORMAL LOW (ref 3.5–5.0)
Alkaline Phosphatase: 116 U/L (ref 38–126)
Anion gap: 11 (ref 5–15)
BUN: 17 mg/dL (ref 8–23)
CO2: 21 mmol/L — ABNORMAL LOW (ref 22–32)
Calcium: 7.8 mg/dL — ABNORMAL LOW (ref 8.9–10.3)
Chloride: 95 mmol/L — ABNORMAL LOW (ref 98–111)
Creatinine, Ser: 1.04 mg/dL (ref 0.61–1.24)
GFR calc Af Amer: >60 mL/min (ref >60)
GFR calc non Af Amer: >60 mL/min (ref >60)
Glucose, Bld: 119 mg/dL — ABNORMAL HIGH (ref 70–99)
Potassium: 4.8 mmol/L (ref 3.5–5.1)
Sodium: 127 mmol/L — ABNORMAL LOW (ref 135–145)
Total Bilirubin: 1 mg/dL (ref 0.3–1.2)
Total Protein: 5.3 g/dL — ABNORMAL LOW (ref 6.5–8.1)

## 2020-02-24 LAB — URINE CULTURE: Culture: NO GROWTH

## 2020-02-24 LAB — STREP PNEUMONIAE URINARY ANTIGEN: Strep Pneumo Urinary Antigen: NEGATIVE

## 2020-02-24 LAB — PROCALCITONIN
Procalcitonin: 0.37 ng/mL
Procalcitonin: 0.7 ng/mL

## 2020-02-24 MED ORDER — SENNOSIDES-DOCUSATE SODIUM 8.6-50 MG PO TABS: 1.0000 | ORAL_TABLET | Freq: Every evening | ORAL | Status: DC | PRN

## 2020-02-24 MED ORDER — SODIUM CHLORIDE 0.9 % IV SOLN
500.0000 mg | INTRAVENOUS | Status: DC
  Administered 2020-02-24 – 2020-02-26 (×3): 500 mg via INTRAVENOUS
  Filled 2020-02-24 (×4): qty 500

## 2020-02-24 MED ORDER — LACTATED RINGERS IV BOLUS
500.0000 mL | Freq: Once | INTRAVENOUS | Status: AC
  Administered 2020-02-24: 500 mL via INTRAVENOUS

## 2020-02-24 MED ORDER — ENOXAPARIN SODIUM 40 MG/0.4ML ~~LOC~~ SOLN
40.0000 mg | SUBCUTANEOUS | Status: DC
  Administered 2020-02-24 – 2020-02-27 (×4): 40 mg via SUBCUTANEOUS
  Filled 2020-02-24 (×5): qty 0.4

## 2020-02-24 MED ORDER — SODIUM CHLORIDE 0.9 % IV SOLN: INTRAVENOUS | Status: DC

## 2020-02-24 MED ORDER — SODIUM CHLORIDE 0.9% FLUSH
3.0000 mL | Freq: Two times a day (BID) | INTRAVENOUS | Status: DC
  Administered 2020-02-24 – 2020-02-27 (×8): 3 mL via INTRAVENOUS

## 2020-02-24 MED ORDER — SODIUM CHLORIDE 0.9 % IV SOLN
2.0000 g | INTRAVENOUS | Status: DC
  Administered 2020-02-24 – 2020-02-25 (×2): 2 g via INTRAVENOUS
  Filled 2020-02-24 (×2): qty 20
  Filled 2020-02-24: qty 2

## 2020-02-24 MED ORDER — FOLIC ACID 1 MG PO TABS
1.0000 mg | ORAL_TABLET | Freq: Every day | ORAL | Status: DC
  Administered 2020-02-24 – 2020-02-27 (×4): 1 mg via ORAL
  Filled 2020-02-24 (×4): qty 1

## 2020-02-24 MED ORDER — ONDANSETRON HCL 4 MG PO TABS: 4.0000 mg | ORAL_TABLET | Freq: Four times a day (QID) | ORAL | Status: DC | PRN

## 2020-02-24 MED ORDER — ONDANSETRON HCL 4 MG/2ML IJ SOLN: 4.0000 mg | Freq: Four times a day (QID) | INTRAMUSCULAR | Status: DC | PRN

## 2020-02-24 MED ORDER — ACETAMINOPHEN 325 MG PO TABS: 650.0000 mg | ORAL_TABLET | Freq: Four times a day (QID) | ORAL | Status: DC | PRN

## 2020-02-24 MED ORDER — ACETAMINOPHEN 650 MG RE SUPP: 650.0000 mg | Freq: Four times a day (QID) | RECTAL | Status: DC | PRN

## 2020-02-24 NOTE — Progress Notes (Signed)
Progress Note    Jason Hart  WVP:[id number] DOB: [birth year]  DOA: 02/23/2020 PCP: Biagio Borg, MD    Brief Narrative:     Medical records reviewed and are as summarized below:  Jason Hart is an 74 y.o. male with medical history significant for psoriatic arthritis, now presenting to the emergency department with fevers, general weakness and malaise, and multiple falls.  Patient reports that his symptoms began on 02/17/2020 with chills and general malaise.  Since then, he has gone on to develop fevers and worsening general weakness with multiple falls.   Assessment/Plan:   Principal Problem:   Community acquired pneumonia of right lower lobe of lung Active Problems:   Psoriatic arthritis ([place])   Hyponatremia   Rhabdomyolysis   Mild renal insufficiency   Multiple falls   Sepsis (POA) secondary to PNA  - Presents with several days of general weakness, fevers, malaise, and slight cough and is found to be febrile with leukocytosis and RLL pneumonia on CXR  - Blood cultures were collected in ED  -Rocephin and azithromycin  - Culture sputum, strep pneumo negative, legionella antigens pending - trend procalcitonin -bolus IVF as still hypotensive  Patient reported loose stools -check c diff as well as GI pathogen panel -no reported risk factors for c diff: no contacts, no recent abx, no recent hospitalizations   Hyponatremia  - Serum sodium is 126 on admission in setting of hypovolemia  - Continue NS infusion and repeat chem panel daily  Rhabdomyolysis  - CK is elevated with mild renal insufficiency and "large Hgb" on UA but no RBC on microscopy  - Hold statin, continue IVF hydration, monitor renal function     Mild renal insufficiency  - SCr is 1.26 on admission, up from 1.08 a year ago  - Renally-dose medications, monitor with IVF hydration    Psoriatic arthritis  - Patient reports this to be well-controlled    Falls  - Patient reports  multiple falls recently due to general weakness  - There are no acute neuro deficits on exam, no acute findings on head CT, and this is likely secondary to the acute infectious illness with hyponatremia  - Anticipate improvement with continued IVF hydration and antibiotics -PT eval in next 24-48 hours   Family Communication/Anticipated D/C date and plan/Code Status   DVT prophylaxis: Lovenox ordered. Code Status: Full Code.  Disposition Plan: Status is: Inpatient  Remains inpatient appropriate because:IV treatments appropriate due to intensity of illness or inability to take PO   Dispo: The patient is from: Home              Anticipated d/c is to: Home              Anticipated d/c date is: 2 days              Patient currently is not medically stable to d/c.         Medical Consultants:    None.   Subjective:   Says he has has more than 10 watery stools since admission  Objective:    Vitals:   02/23/20 2045 02/23/20 2100 02/23/20 2151 02/24/20 0822  BP: (!) 128/99 115/62 (!) 113/57 93/80  Pulse: 75 71 72 79  Resp: 14 19 14 20   Temp:   99 F (37.2 C) (!) 101.1 F (38.4 C)  TempSrc:      SpO2: 96% 97% 97% 99%    Intake/Output Summary (Last 24 hours) at 02/24/2020  [place] filed at 02/24/2020 0500 Gross per 24 hour  Intake 1664.99 ml  Output --  Net 1664.99 ml   There were no vitals filed for this visit.  Exam:  General: Appearance:     Overweight male ; uncomfortable appearing     Lungs:     Not on O2, respirations unlabored  Heart:    Normal heart rate. Normal rhythm. No murmurs, rubs, or gallops.   MS:   All extremities are intact.   Neurologic:   Awake, alert. No apparent focal neurological defect.     Data Reviewed:   I have personally reviewed following labs and imaging studies:  Labs: Labs show the following:   Basic Metabolic Panel: Recent Labs  Lab 02/23/20 1541 02/24/20 0447  NA 126* 127*  K 4.2 4.8  CL 93* 95*  CO2 24  21*  GLUCOSE 122* 119*  BUN 19 17  CREATININE 1.26* 1.04  CALCIUM 8.1* 7.8*   GFR CrCl cannot be calculated (Unknown ideal weight.). Liver Function Tests: Recent Labs  Lab 02/23/20 1541 02/24/20 0447  AST 63* 76*  ALT 36 40  ALKPHOS 106 116  BILITOT 1.0 1.0  PROT 6.2* 5.3*  ALBUMIN 2.8* 2.3*   No results for input(s): LIPASE, AMYLASE in the last 168 hours. No results for input(s): AMMONIA in the last 168 hours. Coagulation profile Recent Labs  Lab 02/23/20 1541  INR 1.2    CBC: Recent Labs  Lab 02/23/20 1541 02/24/20 0447  WBC 11.6* 12.1*  NEUTROABS 10.3* 10.8*  HGB 14.7 14.0  HCT 43.4 41.6  MCV 95.8 95.0  PLT 216 202   Cardiac Enzymes: Recent Labs  Lab 02/23/20 1541  CKTOTAL 2,502*   BNP (last 3 results) No results for input(s): PROBNP in the last 8760 hours. CBG: No results for input(s): GLUCAP in the last 168 hours. D-Dimer: No results for input(s): DDIMER in the last 72 hours. Hgb A1c: No results for input(s): HGBA1C in the last 72 hours. Lipid Profile: No results for input(s): CHOL, HDL, LDLCALC, TRIG, CHOLHDL, LDLDIRECT in the last 72 hours. Thyroid function studies: No results for input(s): TSH, T4TOTAL, T3FREE, THYROIDAB in the last 72 hours.  Invalid input(s): FREET3 Anemia work up: No results for input(s): VITAMINB12, FOLATE, FERRITIN, TIBC, IRON, RETICCTPCT in the last 72 hours. Sepsis Labs: Recent Labs  Lab 02/23/20 1541 02/23/20 1937 02/24/20 0447  PROCALCITON 0.37  --  0.70  WBC 11.6*  --  12.1*  LATICACIDVEN 1.5 1.2  --     Microbiology Recent Results (from the past 240 hour(s))  Novel Coronavirus, NAA ([facility])     Status: None   Collection Time: 02/21/20  1:52 PM   Specimen: Nasopharyngeal(NP) swabs in vial transport medium   Nasopharynge  Testing  Result Value Ref Range Status   SARS-CoV-2, NAA Not Detected Not Detected Final    Comment: This nucleic acid amplification test was developed and its  performance characteristics determined by [employer]. Nucleic acid amplification tests include RT-PCR and TMA. This test has not been FDA cleared or approved. This test has been authorized by FDA under an Emergency Use Authorization (EUA). This test is only authorized for the duration of time the declaration that circumstances exist justifying the authorization of the emergency use of in vitro diagnostic tests for detection of SARS-CoV-2 virus and/or diagnosis of COVID-19 infection under section 564(b)(1) of the Act, 21 U.S.C. 623JSE-8(B) (1), unless the authorization is terminated or revoked sooner. When diagnostic testing is negative,  the possibility of a false negative result should be considered in the context of a patient's recent exposures and the presence of clinical signs and symptoms consistent with COVID-19. An individual without symptoms of COVID-19 and who is not shedding SARS-CoV-2 virus wo uld expect to have a negative (not detected) result in this assay.   SARS-COV-2, NAA 2 DAY TAT     Status: None   Collection Time: 02/21/20  1:52 PM   Nasopharynge  Testing  Result Value Ref Range Status   SARS-CoV-2, NAA 2 DAY TAT Performed  Final  SARS Coronavirus 2 by RT PCR (hospital order, performed in [place] hospital lab) Nasopharyngeal Nasopharyngeal Swab     Status: None   Collection Time: 02/23/20  3:06 PM   Specimen: Nasopharyngeal Swab  Result Value Ref Range Status   SARS Coronavirus 2 NEGATIVE NEGATIVE Final    Comment: (NOTE) SARS-CoV-2 target nucleic acids are NOT DETECTED.  The SARS-CoV-2 RNA is generally detectable in upper and lower respiratory specimens during the acute phase of infection. The lowest concentration of SARS-CoV-2 viral copies this assay can detect is 250 copies / mL. A negative result does not preclude SARS-CoV-2 infection and should not be used as the sole basis for treatment or other patient management decisions.  A negative result  may occur with improper specimen collection / handling, submission of specimen other than nasopharyngeal swab, presence of viral mutation(s) within the areas targeted by this assay, and inadequate number of viral copies (<250 copies / mL). A negative result must be combined with clinical observations, patient history, and epidemiological information.  Fact Sheet for Patients:   StrictlyIdeas.no  Fact Sheet for Healthcare Providers: BankingDealers.co.za  This test is not yet approved or  cleared by the [place] FDA and has been authorized for detection and/or diagnosis of SARS-CoV-2 by FDA under an Emergency Use Authorization (EUA).  This EUA will remain in effect (meaning this test can be used) for the duration of the COVID-19 declaration under Section 564(b)(1) of the Act, 21 U.S.C. section 360bbb-3(b)(1), unless the authorization is terminated or revoked sooner.  Performed at [facility], [facility] [address], [place], [place] [address]     Procedures and diagnostic studies:  CT Head Wo Contrast  Result Date: 02/23/2020 CLINICAL DATA:  Fall. EXAM: CT HEAD WITHOUT CONTRAST CT CERVICAL SPINE WITHOUT CONTRAST TECHNIQUE: Multidetector CT imaging of the head and cervical spine was performed following the standard protocol without intravenous contrast. Multiplanar CT image reconstructions of the cervical spine were also generated. COMPARISON:  None. FINDINGS: CT HEAD FINDINGS Brain: Mild chronic ischemic white matter disease is noted. No mass effect or midline shift is noted. Ventricular size is within normal limits. There is no evidence of mass lesion, hemorrhage or acute infarction. Vascular: No hyperdense vessel or unexpected calcification. Skull: Normal. Negative for fracture or focal lesion. Sinuses/Orbits: No acute finding. Other: None. CT CERVICAL SPINE FINDINGS Alignment: Normal. Skull base and vertebrae: No acute fracture. No  primary bone lesion or focal pathologic process. Soft tissues and spinal canal: No prevertebral fluid or swelling. No visible canal hematoma. Disc levels:  Severe degenerative disc disease is noted at C7-T1. Upper chest: Negative. Other: None. IMPRESSION: 1. Mild chronic ischemic white matter disease. No acute intracranial abnormality seen. 2. Severe degenerative disc disease is noted at C7-T1. No acute abnormality seen in the cervical spine. Electronically Signed   By: Marijo Conception M.D.   On: 02/23/2020 19:05   CT Cervical Spine Wo  Contrast  Result Date: 02/23/2020 CLINICAL DATA:  Fall. EXAM: CT HEAD WITHOUT CONTRAST CT CERVICAL SPINE WITHOUT CONTRAST TECHNIQUE: Multidetector CT imaging of the head and cervical spine was performed following the standard protocol without intravenous contrast. Multiplanar CT image reconstructions of the cervical spine were also generated. COMPARISON:  None. FINDINGS: CT HEAD FINDINGS Brain: Mild chronic ischemic white matter disease is noted. No mass effect or midline shift is noted. Ventricular size is within normal limits. There is no evidence of mass lesion, hemorrhage or acute infarction. Vascular: No hyperdense vessel or unexpected calcification. Skull: Normal. Negative for fracture or focal lesion. Sinuses/Orbits: No acute finding. Other: None. CT CERVICAL SPINE FINDINGS Alignment: Normal. Skull base and vertebrae: No acute fracture. No primary bone lesion or focal pathologic process. Soft tissues and spinal canal: No prevertebral fluid or swelling. No visible canal hematoma. Disc levels:  Severe degenerative disc disease is noted at C7-T1. Upper chest: Negative. Other: None. IMPRESSION: 1. Mild chronic ischemic white matter disease. No acute intracranial abnormality seen. 2. Severe degenerative disc disease is noted at C7-T1. No acute abnormality seen in the cervical spine. Electronically Signed   By: Marijo Conception M.D.   On: 02/23/2020 19:05   DG Chest Port 1  View  Result Date: 02/23/2020 CLINICAL DATA:  Fever, weakness EXAM: PORTABLE CHEST 1 VIEW COMPARISON:  None. FINDINGS: There is right basilar consolidation most in keeping with changes of acute lobar pneumonia in the a appropriate clinical setting. Left lung is clear. No pneumothorax or pleural effusion. Cardiac size is within normal limits IMPRESSION: Right lower lobar pneumonia. Electronically Signed   By: Fidela Salisbury MD   On: 02/23/2020 16:17    Medications:   . enoxaparin (LOVENOX) injection  40 mg Subcutaneous Q24H  . folic acid  1 mg Oral Daily  . sodium chloride flush  3 mL Intravenous Q12H   Continuous Infusions: . sodium chloride 125 mL/hr at 02/24/20 0859  . azithromycin    . cefTRIAXone (ROCEPHIN)  IV       LOS: 1 day   Geradine Girt  [facility]   How to contact the [facility] Attending or Consulting provider [place] or covering provider during after hours [place], for this patient?  1. Check the care team in [facility] and look for a) attending/consulting [facility] provider listed and b) the [facility] team listed 2. Log into [email or web address] and use [place]'s universal password to access. If you do not have the password, please contact the hospital operator. 3. Locate the [facility] provider you are looking for under [facility] and page to a number that you can be directly reached. 4. If you still have difficulty reaching the provider, please page the [facility] (Director on Call) for the Hospitalists listed on amion for assistance.  02/24/2020, 10:31 AM

## 2020-02-25 LAB — CBC WITH DIFFERENTIAL/PLATELET
Abs Immature Granulocytes: 0.1 10*3/uL — ABNORMAL HIGH (ref 0.00–0.07)
Basophils Absolute: 0 10*3/uL (ref 0.0–0.1)
Basophils Relative: 0 %
Eosinophils Absolute: 0.1 10*3/uL (ref 0.0–0.5)
Eosinophils Relative: 1 %
HCT: 37 % — ABNORMAL LOW (ref 39.0–52.0)
Hemoglobin: 12.6 g/dL — ABNORMAL LOW (ref 13.0–17.0)
Immature Granulocytes: 1 %
Lymphocytes Relative: 8 %
Lymphs Abs: 0.8 10*3/uL (ref 0.7–4.0)
MCH: 32.8 pg (ref 26.0–34.0)
MCHC: 34.1 g/dL (ref 30.0–36.0)
MCV: 96.4 fL (ref 80.0–100.0)
Monocytes Absolute: 0.7 10*3/uL (ref 0.1–1.0)
Monocytes Relative: 7 %
Neutro Abs: 9 10*3/uL — ABNORMAL HIGH (ref 1.7–7.7)
Neutrophils Relative %: 83 %
Platelets: 225 10*3/uL (ref 150–400)
RBC: 3.84 MIL/uL — ABNORMAL LOW (ref 4.22–5.81)
RDW: 12.9 % (ref 11.5–15.5)
WBC: 10.7 10*3/uL — ABNORMAL HIGH (ref 4.0–10.5)
nRBC: 0 % (ref 0.0–0.2)

## 2020-02-25 LAB — COMPREHENSIVE METABOLIC PANEL
ALT: 42 U/L (ref 0–44)
AST: 60 U/L — ABNORMAL HIGH (ref 15–41)
Albumin: 1.9 g/dL — ABNORMAL LOW (ref 3.5–5.0)
Alkaline Phosphatase: 98 U/L (ref 38–126)
Anion gap: 7 (ref 5–15)
BUN: 12 mg/dL (ref 8–23)
CO2: 24 mmol/L (ref 22–32)
Calcium: 7.6 mg/dL — ABNORMAL LOW (ref 8.9–10.3)
Chloride: 97 mmol/L — ABNORMAL LOW (ref 98–111)
Creatinine, Ser: 0.99 mg/dL (ref 0.61–1.24)
GFR calc Af Amer: >60 mL/min (ref >60)
GFR calc non Af Amer: >60 mL/min (ref >60)
Glucose, Bld: 111 mg/dL — ABNORMAL HIGH (ref 70–99)
Potassium: 4 mmol/L (ref 3.5–5.1)
Sodium: 128 mmol/L — ABNORMAL LOW (ref 135–145)
Total Bilirubin: 0.2 mg/dL — ABNORMAL LOW (ref 0.3–1.2)
Total Protein: 4.8 g/dL — ABNORMAL LOW (ref 6.5–8.1)

## 2020-02-25 LAB — PROCALCITONIN: Procalcitonin: 0.57 ng/mL

## 2020-02-25 LAB — LEGIONELLA PNEUMOPHILA SEROGP 1 UR AG: L. pneumophila Serogp 1 Ur Ag: POSITIVE — AB

## 2020-02-25 MED ORDER — ALBUTEROL SULFATE (2.5 MG/3ML) 0.083% IN NEBU: 2.5000 mg | INHALATION_SOLUTION | RESPIRATORY_TRACT | Status: DC | PRN

## 2020-02-25 MED ORDER — METHYLPREDNISOLONE SODIUM SUCC 40 MG IJ SOLR
40.0000 mg | Freq: Four times a day (QID) | INTRAMUSCULAR | Status: DC
  Administered 2020-02-25 – 2020-02-26 (×5): 40 mg via INTRAVENOUS
  Filled 2020-02-25 (×5): qty 1

## 2020-02-25 NOTE — Progress Notes (Signed)
Progress Note    Jason Hart  IWO:[id number] DOB: Jan 15, 1946  DOA: 02/23/2020 PCP: Biagio Borg, MD    Brief Narrative:     Medical records reviewed and are as summarized below:  Jason Hart is an 74 y.o. male with medical history significant for psoriatic arthritis, now presenting to the emergency department with fevers, general weakness and malaise, and multiple falls.  Patient reports that his symptoms began on 02/17/2020 with chills and general malaise.  Since then, he has gone on to develop fevers and worsening general weakness with multiple falls.   Assessment/Plan:   Principal Problem:   Community acquired pneumonia of right lower lobe of lung Active Problems:   Psoriatic arthritis ([place])   Hyponatremia   Rhabdomyolysis   Mild renal insufficiency   Multiple falls   Sepsis (POA) secondary to PNA (legionella) - Presents with several days of general weakness, fevers, malaise, and slight cough and is found to be febrile with leukocytosis and RLL pneumonia on CXR  - Blood cultures were collected in ED  -Rocephin and azithromycin : should be able to narrow to azithro soon -added steroids and nebs/pulm toilet -strep pneumo negative, legionella antigen positive - trend procalcitonin  Patient reported loose stools -check c diff as well as GI pathogen panel -no reported risk factors for c diff: no contacts, no recent abx, no recent hospitalizations -? Related to PNA   Hyponatremia  - Serum sodium is 126 on admission in setting of hypovolemia  - Continue NS infusion and repeat chem panel daily  Rhabdomyolysis  - CK is elevated with mild renal insufficiency and "large Hgb" on UA but no RBC on microscopy  - Hold statin, continue IVF hydration, monitor renal function     Mild renal insufficiency  - SCr is 1.26 on admission, up from 1.08 a year ago  - Renally-dose medications, monitor with IVF hydration    Psoriatic arthritis  - Patient reports this  to be well-controlled    Falls  - Patient reports multiple falls recently due to general weakness  - There are no acute neuro deficits on exam, no acute findings on head CT, and this is likely secondary to the acute infectious illness with hyponatremia  - Anticipate improvement with continued IVF hydration and antibiotics -PT eval    Family Communication/Anticipated D/C date and plan/Code Status   DVT prophylaxis: Lovenox ordered. Code Status: Full Code.  Disposition Plan: Status is: Inpatient Called daughterStanton Kidney-- left message as no answer  Remains inpatient appropriate because:IV treatments appropriate due to intensity of illness or inability to take PO   Dispo: The patient is from: Home              Anticipated d/c is to: Home              Anticipated d/c date is: 2 days              Patient currently is not medically stable to d/c.         Medical Consultants:    None.   Subjective:   Has been trying not to cough-- chest feels tight  Objective:    Vitals:   02/24/20 1300 02/24/20 1707 02/24/20 2321 02/25/20 0820  BP:  121/60 120/60 (!) 116/59  Pulse:  80 72 60  Resp:  20 20 16   Temp:  99.7 F (37.6 C) 99.2 F (37.3 C) 98.2 F (36.8 C)  TempSrc:      SpO2:  94% 94% 95%  Weight: 79.8 kg     Height: 5\' 7"  (1.702 m)       Intake/Output Summary (Last 24 hours) at 02/25/2020 1216 Last data filed at 02/25/2020 0300 Gross per 24 hour  Intake 1945 ml  Output 750 ml  Net 1195 ml   Filed Weights   02/24/20 1300  Weight: 79.8 kg    Exam:  General: Appearance:     Overweight male in no acute distress     Lungs:     Mild increase in work of breathing, upper airway sounds  Heart:    Normal heart rate. Normal rhythm. No murmurs, rubs, or gallops.   MS:   All extremities are intact.   Neurologic:   Awake, alert, oriented x 3. No apparent focal neurological           defect.    Data Reviewed:   I have personally reviewed following labs and imaging  studies:  Labs: Labs show the following:   Basic Metabolic Panel: Recent Labs  Lab 02/23/20 1541 02/23/20 1541 02/24/20 0447 02/25/20 0313  NA 126*  --  127* 128*  K 4.2   < > 4.8 4.0  CL 93*  --  95* 97*  CO2 24  --  21* 24  GLUCOSE 122*  --  119* 111*  BUN 19  --  17 12  CREATININE 1.26*  --  1.04 0.99  CALCIUM 8.1*  --  7.8* 7.6*   < > = values in this interval not displayed.   GFR Estimated Creatinine Clearance: 67.3 mL/min (by C-G formula based on SCr of 0.99 mg/dL). Liver Function Tests: Recent Labs  Lab 02/23/20 1541 02/24/20 0447 02/25/20 0313  AST 63* 76* 60*  ALT 36 40 42  ALKPHOS 106 116 98  BILITOT 1.0 1.0 0.2*  PROT 6.2* 5.3* 4.8*  ALBUMIN 2.8* 2.3* 1.9*   No results for input(s): LIPASE, AMYLASE in the last 168 hours. No results for input(s): AMMONIA in the last 168 hours. Coagulation profile Recent Labs  Lab 02/23/20 1541  INR 1.2    CBC: Recent Labs  Lab 02/23/20 1541 02/24/20 0447 02/25/20 0313  WBC 11.6* 12.1* 10.7*  NEUTROABS 10.3* 10.8* 9.0*  HGB 14.7 14.0 12.6*  HCT 43.4 41.6 37.0*  MCV 95.8 95.0 96.4  PLT 216 202 225   Cardiac Enzymes: Recent Labs  Lab 02/23/20 1541  CKTOTAL 2,502*   BNP (last 3 results) No results for input(s): PROBNP in the last 8760 hours. CBG: No results for input(s): GLUCAP in the last 168 hours. D-Dimer: No results for input(s): DDIMER in the last 72 hours. Hgb A1c: No results for input(s): HGBA1C in the last 72 hours. Lipid Profile: No results for input(s): CHOL, HDL, LDLCALC, TRIG, CHOLHDL, LDLDIRECT in the last 72 hours. Thyroid function studies: No results for input(s): TSH, T4TOTAL, T3FREE, THYROIDAB in the last 72 hours.  Invalid input(s): FREET3 Anemia work up: No results for input(s): VITAMINB12, FOLATE, FERRITIN, TIBC, IRON, RETICCTPCT in the last 72 hours. Sepsis Labs: Recent Labs  Lab 02/23/20 1541 02/23/20 1937 02/24/20 0447 02/25/20 0313  PROCALCITON 0.37  --  0.70 0.57   WBC 11.6*  --  12.1* 10.7*  LATICACIDVEN 1.5 1.2  --   --     Microbiology Recent Results (from the past 240 hour(s))  Novel Coronavirus, NAA ([facility])     Status: None   Collection Time: 02/21/20  1:52 PM   Specimen: Nasopharyngeal(NP) swabs in vial transport medium  Nasopharynge  Testing  Result Value Ref Range Status   SARS-CoV-2, NAA Not Detected Not Detected Final    Comment: This nucleic acid amplification test was developed and its performance characteristics determined by [employer]. Nucleic acid amplification tests include RT-PCR and TMA. This test has not been FDA cleared or approved. This test has been authorized by FDA under an Emergency Use Authorization (EUA). This test is only authorized for the duration of time the declaration that circumstances exist justifying the authorization of the emergency use of in vitro diagnostic tests for detection of SARS-CoV-2 virus and/or diagnosis of COVID-19 infection under section 564(b)(1) of the Act, 21 U.S.C. 409WJX-9(J) (1), unless the authorization is terminated or revoked sooner. When diagnostic testing is negative, the possibility of a false negative result should be considered in the context of a patient's recent exposures and the presence of clinical signs and symptoms consistent with COVID-19. An individual without symptoms of COVID-19 and who is not shedding SARS-CoV-2 virus wo uld expect to have a negative (not detected) result in this assay.   SARS-COV-2, NAA 2 DAY TAT     Status: None   Collection Time: 02/21/20  1:52 PM   Nasopharynge  Testing  Result Value Ref Range Status   SARS-CoV-2, NAA 2 DAY TAT Performed  Final  SARS Coronavirus 2 by RT PCR (hospital order, performed in [place] hospital lab) Nasopharyngeal Nasopharyngeal Swab     Status: None   Collection Time: 02/23/20  3:06 PM   Specimen: Nasopharyngeal Swab  Result Value Ref Range Status   SARS Coronavirus 2 NEGATIVE NEGATIVE Final     Comment: (NOTE) SARS-CoV-2 target nucleic acids are NOT DETECTED.  The SARS-CoV-2 RNA is generally detectable in upper and lower respiratory specimens during the acute phase of infection. The lowest concentration of SARS-CoV-2 viral copies this assay can detect is 250 copies / mL. A negative result does not preclude SARS-CoV-2 infection and should not be used as the sole basis for treatment or other patient management decisions.  A negative result may occur with improper specimen collection / handling, submission of specimen other than nasopharyngeal swab, presence of viral mutation(s) within the areas targeted by this assay, and inadequate number of viral copies (<250 copies / mL). A negative result must be combined with clinical observations, patient history, and epidemiological information.  Fact Sheet for Patients:   StrictlyIdeas.no  Fact Sheet for Healthcare Providers: BankingDealers.co.za  This test is not yet approved or  cleared by the [place] FDA and has been authorized for detection and/or diagnosis of SARS-CoV-2 by FDA under an Emergency Use Authorization (EUA).  This EUA will remain in effect (meaning this test can be used) for the duration of the COVID-19 declaration under Section 564(b)(1) of the Act, 21 U.S.C. section 360bbb-3(b)(1), unless the authorization is terminated or revoked sooner.  Performed at [facility], [place] [address], [place], [place] [address]   Blood Culture (routine x 2)     Status: None (Preliminary result)   Collection Time: 02/23/20  3:41 PM   Specimen: BLOOD  Result Value Ref Range Status   Specimen Description BLOOD SITE NOT SPECIFIED  Final   Special Requests   Final    BOTTLES DRAWN AEROBIC AND ANAEROBIC Blood Culture results may not be optimal due to an inadequate volume of blood received in culture bottles   Culture   Final    NO GROWTH < 24 HOURS Performed at [facility], [place]  [address], [place], [place] [address]    Report Status PENDING  Incomplete  Blood Culture (routine x 2)     Status: None (Preliminary result)   Collection Time: 02/23/20  4:00 PM   Specimen: BLOOD  Result Value Ref Range Status   Specimen Description BLOOD SITE NOT SPECIFIED  Final   Special Requests   Final    BOTTLES DRAWN AEROBIC AND ANAEROBIC Blood Culture results may not be optimal due to an inadequate volume of blood received in culture bottles   Culture   Final    NO GROWTH < 24 HOURS Performed at [facility], [place] [address], [place], [place] [address]    Report Status PENDING  Incomplete  Urine culture     Status: None   Collection Time: 02/23/20  5:35 PM   Specimen: In/Out Cath Urine  Result Value Ref Range Status   Specimen Description IN/OUT CATH URINE  Final   Special Requests NONE  Final   Culture   Final    NO GROWTH Performed at [facility], [place] [address] [place],  [address]    Report Status 02/24/2020 FINAL  Final    Procedures and diagnostic studies:  CT Head Wo Contrast  Result Date: 02/23/2020 CLINICAL DATA:  Fall. EXAM: CT HEAD WITHOUT CONTRAST CT CERVICAL SPINE WITHOUT CONTRAST TECHNIQUE: Multidetector CT imaging of the head and cervical spine was performed following the standard protocol without intravenous contrast. Multiplanar CT image reconstructions of the cervical spine were also generated. COMPARISON:  None. FINDINGS: CT HEAD FINDINGS Brain: Mild chronic ischemic white matter disease is noted. No mass effect or midline shift is noted. Ventricular size is within normal limits. There is no evidence of mass lesion, hemorrhage or acute infarction. Vascular: No hyperdense vessel or unexpected calcification. Skull: Normal. Negative for fracture or focal lesion. Sinuses/Orbits: No acute finding. Other: None. CT CERVICAL SPINE FINDINGS Alignment: Normal. Skull base and vertebrae: No acute fracture. No primary bone lesion or  focal pathologic process. Soft tissues and spinal canal: No prevertebral fluid or swelling. No visible canal hematoma. Disc levels:  Severe degenerative disc disease is noted at C7-T1. Upper chest: Negative. Other: None. IMPRESSION: 1. Mild chronic ischemic white matter disease. No acute intracranial abnormality seen. 2. Severe degenerative disc disease is noted at C7-T1. No acute abnormality seen in the cervical spine. Electronically Signed   By: Marijo Conception M.D.   On: 02/23/2020 19:05   CT Cervical Spine Wo Contrast  Result Date: 02/23/2020 CLINICAL DATA:  Fall. EXAM: CT HEAD WITHOUT CONTRAST CT CERVICAL SPINE WITHOUT CONTRAST TECHNIQUE: Multidetector CT imaging of the head and cervical spine was performed following the standard protocol without intravenous contrast. Multiplanar CT image reconstructions of the cervical spine were also generated. COMPARISON:  None. FINDINGS: CT HEAD FINDINGS Brain: Mild chronic ischemic white matter disease is noted. No mass effect or midline shift is noted. Ventricular size is within normal limits. There is no evidence of mass lesion, hemorrhage or acute infarction. Vascular: No hyperdense vessel or unexpected calcification. Skull: Normal. Negative for fracture or focal lesion. Sinuses/Orbits: No acute finding. Other: None. CT CERVICAL SPINE FINDINGS Alignment: Normal. Skull base and vertebrae: No acute fracture. No primary bone lesion or focal pathologic process. Soft tissues and spinal canal: No prevertebral fluid or swelling. No visible canal hematoma. Disc levels:  Severe degenerative disc disease is noted at C7-T1. Upper chest: Negative. Other: None. IMPRESSION: 1. Mild chronic ischemic white matter disease. No acute intracranial abnormality  seen. 2. Severe degenerative disc disease is noted at C7-T1. No acute abnormality seen in the cervical spine. Electronically Signed   By: Marijo Conception M.D.   On: 02/23/2020 19:05   DG Chest Port 1 View  Result Date:  02/23/2020 CLINICAL DATA:  Fever, weakness EXAM: PORTABLE CHEST 1 VIEW COMPARISON:  None. FINDINGS: There is right basilar consolidation most in keeping with changes of acute lobar pneumonia in the a appropriate clinical setting. Left lung is clear. No pneumothorax or pleural effusion. Cardiac size is within normal limits IMPRESSION: Right lower lobar pneumonia. Electronically Signed   By: Fidela Salisbury MD   On: 02/23/2020 16:17    Medications:   . enoxaparin (LOVENOX) injection  40 mg Subcutaneous Q24H  . folic acid  1 mg Oral Daily  . methylPREDNISolone (SOLU-MEDROL) injection  40 mg Intravenous Q6H  . sodium chloride flush  3 mL Intravenous Q12H   Continuous Infusions: . sodium chloride 125 mL/hr at 02/24/20 2306  . azithromycin 500 mg (02/24/20 1725)  . cefTRIAXone (ROCEPHIN)  IV 2 g (02/24/20 1540)     LOS: 2 days   Geradine Girt  [facility]   How to contact the [facility] Attending or Consulting provider [place] or covering provider during after hours [place], for this patient?  1. Check the care team in [facility] and look for a) attending/consulting [facility] provider listed and b) the [facility] team listed 2. Log into [email or web address] and use [place]'s universal password to access. If you do not have the password, please contact the hospital operator. 3. Locate the [facility] provider you are looking for under [facility] and page to a number that you can be directly reached. 4. If you still have difficulty reaching the provider, please page the [facility] (Director on Call) for the Hospitalists listed on amion for assistance.  02/25/2020, 12:16 PM

## 2020-02-26 LAB — CBC WITH DIFFERENTIAL/PLATELET
Abs Immature Granulocytes: 0.15 10*3/uL — ABNORMAL HIGH (ref 0.00–0.07)
Basophils Absolute: 0 10*3/uL (ref 0.0–0.1)
Basophils Relative: 0 %
Eosinophils Absolute: 0 10*3/uL (ref 0.0–0.5)
Eosinophils Relative: 0 %
HCT: 45.5 % (ref 39.0–52.0)
Hemoglobin: 15.7 g/dL (ref 13.0–17.0)
Immature Granulocytes: 1 %
Lymphocytes Relative: 4 %
Lymphs Abs: 0.6 10*3/uL — ABNORMAL LOW (ref 0.7–4.0)
MCH: 32.6 pg (ref 26.0–34.0)
MCHC: 34.5 g/dL (ref 30.0–36.0)
MCV: 94.4 fL (ref 80.0–100.0)
Monocytes Absolute: 0.3 10*3/uL (ref 0.1–1.0)
Monocytes Relative: 2 %
Neutro Abs: 12.4 10*3/uL — ABNORMAL HIGH (ref 1.7–7.7)
Neutrophils Relative %: 93 %
Platelets: 326 10*3/uL (ref 150–400)
RBC: 4.82 MIL/uL (ref 4.22–5.81)
RDW: 12.7 % (ref 11.5–15.5)
WBC: 13.4 10*3/uL — ABNORMAL HIGH (ref 4.0–10.5)
nRBC: 0 % (ref 0.0–0.2)

## 2020-02-26 LAB — COMPREHENSIVE METABOLIC PANEL
ALT: 79 U/L — ABNORMAL HIGH (ref 0–44)
AST: 76 U/L — ABNORMAL HIGH (ref 15–41)
Albumin: 2.1 g/dL — ABNORMAL LOW (ref 3.5–5.0)
Alkaline Phosphatase: 116 U/L (ref 38–126)
Anion gap: 9 (ref 5–15)
BUN: 17 mg/dL (ref 8–23)
CO2: 25 mmol/L (ref 22–32)
Calcium: 8.5 mg/dL — ABNORMAL LOW (ref 8.9–10.3)
Chloride: 101 mmol/L (ref 98–111)
Creatinine, Ser: 0.86 mg/dL (ref 0.61–1.24)
GFR calc Af Amer: >60 mL/min (ref >60)
GFR calc non Af Amer: >60 mL/min (ref >60)
Glucose, Bld: 265 mg/dL — ABNORMAL HIGH (ref 70–99)
Potassium: 4.2 mmol/L (ref 3.5–5.1)
Sodium: 135 mmol/L (ref 135–145)
Total Bilirubin: 0.5 mg/dL (ref 0.3–1.2)
Total Protein: 5.6 g/dL — ABNORMAL LOW (ref 6.5–8.1)

## 2020-02-26 MED ORDER — METHYLPREDNISOLONE SODIUM SUCC 40 MG IJ SOLR
40.0000 mg | Freq: Two times a day (BID) | INTRAMUSCULAR | Status: DC
  Administered 2020-02-26: 40 mg via INTRAVENOUS
  Filled 2020-02-26: qty 1

## 2020-02-26 NOTE — Evaluation (Signed)
Physical Therapy Evaluation Patient Details Name: Jason Hart MRN: [id number] DOB: 08/04/1946 Today's Date: 02/26/2020   History of Present Illness  74 y/o male with a h/o arthritis, BPH, conductive hearing loss, hyperlipidemia, psoriatic arthritis, who presents to ED on 7/16 with falls, weakness. Pt with medical diagnosis of sepsis 2* R lobar PNA (legionella).    Clinical Impression  Pt presents with generalized weakness, unsteadiness in standing with history of recent falls, and decreased activity tolerance vs baseline. Pt to benefit from acute PT to address deficits. Pt ambulated hallway distance with min guard to supervision level of assist, pt with LOB during distraction requiring PT assist to correct. Pt maintained O2sats during gait on RA, mild dyspnea noted but recovered with rest. Pt recently lost his wife ~2 months ago, and was very emotional with PT. PT recommended his daughter assisting him at home while he recovers, pt stating his daughter works remotely and can stay with him. Pt declines HHPT, so PT educated pt on mobility progression while activity tolerance recovers post-acutely. PT to progress mobility as tolerated, and will continue to follow acutely.    SATURATION QUALIFICATIONS: (This note is used to comply with regulatory documentation for home oxygen)  Patient Saturations on Room Air at Rest = 93%  Patient Saturations on Room Air while Ambulating = 91%  Patient Saturations on N/A Liters of oxygen while Ambulating = N/a%  Please briefly explain why patient needs home oxygen: does not qualify for home O2 at this time.     Follow Up Recommendations Home health PT;Supervision for mobility/OOB (pt politely declines)    Equipment Recommendations  None recommended by PT    Recommendations for Other Services       Precautions / Restrictions Precautions Precautions: Fall Precaution Comments: x3 falls at home Restrictions Weight Bearing Restrictions: No       Mobility  Bed Mobility Overal bed mobility: Needs Assistance Bed Mobility: Supine to Sit     Supine to sit: Supervision;HOB elevated     General bed mobility comments: for safety, use of HOB elevation and increased time to perform.  Transfers Overall transfer level: Needs assistance Equipment used: None Transfers: Sit to/from Stand Sit to Stand: Min guard         General transfer comment: for safety, pt self-steadied upon standing.  Ambulation/Gait Ambulation/Gait assistance: Supervision;Min guard Gait Distance (Feet): 220 Feet Assistive device: None Gait Pattern/deviations: Decreased stride length;Step-through pattern;Shuffle Gait velocity: decr   General Gait Details: min guard to supervision for safety, x1 min assist for correcting L lateral LOB when pt attempted to adjust pulse ox during gait. SpO2 WFL, PT encouraged pt to increase step length but pt states "this is how I always walk"  Stairs            Wheelchair Mobility    Modified Rankin (Stroke Patients Only)       Balance Overall balance assessment: Needs assistance;History of Falls Sitting-balance support: No upper extremity supported Sitting balance-Leahy Scale: Good     Standing balance support: No upper extremity supported;During functional activity Standing balance-Leahy Scale: Fair Standing balance comment: x1 LOB, PT assist to correct; otherwise ambulates without device                             Pertinent Vitals/Pain Pain Assessment: No/denies pain    Home Living Family/patient expects to be discharged to:: Private residence   Available Help at Discharge: Friend(s);Neighbor;Available PRN/intermittently;Family (daughter lives ~1  hour away, can come stay with pt. Pt lives in a condo, has great neighbors per pt report) Type of Home: Apartment Home Access: Elevator     Home Layout: One level Home Equipment: None      Prior Function Level of Independence: Independent          Comments: Does not use AD, independent in ADLs PTA. Pt lost his wife 2 months ago     Hand Dominance   Dominant Hand: Left    Extremity/Trunk Assessment   Upper Extremity Assessment Upper Extremity Assessment: Overall WFL for tasks assessed    Lower Extremity Assessment Lower Extremity Assessment: Generalized weakness    Cervical / Trunk Assessment Cervical / Trunk Assessment: Normal  Communication   Communication: No difficulties  Cognition Arousal/Alertness: Awake/alert Behavior During Therapy: WFL for tasks assessed/performed Overall Cognitive Status: Within Functional Limits for tasks assessed                                        General Comments      Exercises     Assessment/Plan    PT Assessment Patient needs continued PT services  PT Problem List Decreased strength;Decreased mobility;Decreased activity tolerance;Decreased balance;Cardiopulmonary status limiting activity       PT Treatment Interventions Therapeutic activities;Patient/family education;Balance training;Therapeutic exercise;Gait training;Functional mobility training;Neuromuscular re-education    PT Goals (Current goals can be found in the Care Plan section)  Acute Rehab PT Goals Patient Stated Goal: go home PT Goal Formulation: With patient Time For Goal Achievement: 03/11/20 Potential to Achieve Goals: Good    Frequency Min 3X/week   Barriers to discharge        Co-evaluation               AM-PAC PT "6 Clicks" Mobility  Outcome Measure Help needed turning from your back to your side while in a flat bed without using bedrails?: None Help needed moving from lying on your back to sitting on the side of a flat bed without using bedrails?: A Little Help needed moving to and from a bed to a chair (including a wheelchair)?: A Little Help needed standing up from a chair using your arms (e.g., wheelchair or bedside chair)?: A Little Help needed to walk in  hospital room?: A Little Help needed climbing 3-5 steps with a railing? : A Little 6 Click Score: 19    End of Session   Activity Tolerance: Patient tolerated treatment well Patient left: in chair;with call bell/phone within reach;with chair alarm set Nurse Communication: Mobility status PT Visit Diagnosis: Other abnormalities of gait and mobility (R26.89)    Time: 1111-1140 PT Time Calculation (min) (ACUTE ONLY): 29 min   Charges:   PT Evaluation $PT Eval Low Complexity: 1 Low PT Treatments $Gait Training: 8-22 mins       Paiton Boultinghouse E, PT Acute Rehabilitation Services Pager [phone]  Office [phone]  Jasmin Winberry D Orval Dortch 02/26/2020, 12:30 PM

## 2020-02-26 NOTE — Progress Notes (Signed)
Progress Note    [name]  HGD:[id number] DOB: 12-May-1946  DOA: 02/23/2020 PCP: Biagio Borg, MD    Brief Narrative:     Medical records reviewed and are as summarized below:  Jason Hart is an 74 y.o. male with medical history significant for psoriatic arthritis, now presenting to the emergency department with fevers, general weakness and malaise, and multiple falls.  Patient reports that his symptoms began on 02/17/2020 with chills and general malaise.  Since then, he has gone on to develop fevers and worsening general weakness with multiple falls.   Assessment/Plan:   Principal Problem:   Community acquired pneumonia of right lower lobe of lung Active Problems:   Psoriatic arthritis ([place])   Hyponatremia   Rhabdomyolysis   Mild renal insufficiency   Multiple falls   Sepsis (POA) secondary to PNA (legionella) - Presents with several days of general weakness, fevers, malaise, and slight cough and is found to be febrile with leukocytosis and RLL pneumonia on CXR  - Blood cultures were collected in ED  -Rocephin and azithromycin :  narrow to azithro today -added steroids and nebs/pulm toilet -strep pneumo negative, legionella antigen positive - trend procalcitonin  Patient reported loose stools -resolved, suspect related to legionella   Hyponatremia  - Serum sodium is 126 on admission in setting of hypovolemia  - improved  Rhabdomyolysis  - CK is elevated with mild renal insufficiency and "large Hgb" on UA but no RBC on microscopy  - Hold statin, continue IVF hydration, monitor renal function     Mild renal insufficiency  - SCr is 1.26 on admission, up from 1.08 a year ago  - Renally-dose medications, monitor with IVF hydration    Psoriatic arthritis  - Patient reports this to be well-controlled    Falls  - Patient reports multiple falls recently due to general weakness  - There are no acute neuro deficits on exam, no acute findings on  head CT, and this is likely secondary to the acute infectious illness with hyponatremia  -PT eval    Family Communication/Anticipated D/C date and plan/Code Status   DVT prophylaxis: Lovenox ordered. Code Status: Full Code.  Disposition Plan: Status is: Inpatient Called daughterStanton Kidney-- left message as no answer 7/18  Remains inpatient appropriate because:IV treatments appropriate due to intensity of illness or inability to take PO   Dispo: The patient is from: Home              Anticipated d/c is to: Home              Anticipated d/c date is: 1 day              Patient currently is not medically stable to d/c.         Medical Consultants:    None.   Subjective:   Sitting on side of bed and feeling better- asking to use bedside commode  Objective:    Vitals:   02/25/20 1625 02/25/20 2236 02/26/20 0804 02/26/20 0900  BP: 120/69 119/60 130/67   Pulse: 60 71 (!) 56   Resp: 16 16 16    Temp: 98.4 F (36.9 C) 98 F (36.7 C) (!) 97.2 F (36.2 C)   TempSrc:  Oral Oral   SpO2: 93% 97% 97% 92%  Weight:      Height:        Intake/Output Summary (Last 24 hours) at 02/26/2020 1246 Last data filed at 02/26/2020 0600 Gross per 24 hour  Intake 3 ml  Output 800 ml  Net -797 ml   Filed Weights   02/24/20 1300  Weight: 79.8 kg    Exam:  General: Appearance:     Overweight male in no acute distress     Lungs:     Off O2, no wheezing, respirations unlabored  Heart:    Bradycardic. Normal rhythm. No murmurs, rubs, or gallops.   MS:   All extremities are intact.   Neurologic:   Awake, alert, oriented x 3. No apparent focal neurological           defect.     Data Reviewed:   I have personally reviewed following labs and imaging studies:  Labs: Labs show the following:   Basic Metabolic Panel: Recent Labs  Lab 02/23/20 1541 02/23/20 1541 02/24/20 0447 02/25/20 0313  NA 126*  --  127* 128*  K 4.2   < > 4.8 4.0  CL 93*  --  95* 97*  CO2 24  --  21* 24   GLUCOSE 122*  --  119* 111*  BUN 19  --  17 12  CREATININE 1.26*  --  1.04 0.99  CALCIUM 8.1*  --  7.8* 7.6*   < > = values in this interval not displayed.   GFR Estimated Creatinine Clearance: 67.3 mL/min (by C-G formula based on SCr of 0.99 mg/dL). Liver Function Tests: Recent Labs  Lab 02/23/20 1541 02/24/20 0447 02/25/20 0313  AST 63* 76* 60*  ALT 36 40 42  ALKPHOS 106 116 98  BILITOT 1.0 1.0 0.2*  PROT 6.2* 5.3* 4.8*  ALBUMIN 2.8* 2.3* 1.9*   No results for input(s): LIPASE, AMYLASE in the last 168 hours. No results for input(s): AMMONIA in the last 168 hours. Coagulation profile Recent Labs  Lab 02/23/20 1541  INR 1.2    CBC: Recent Labs  Lab 02/23/20 1541 02/24/20 0447 02/25/20 0313 02/26/20 1235  WBC 11.6* 12.1* 10.7* 13.4*  NEUTROABS 10.3* 10.8* 9.0* 12.4*  HGB 14.7 14.0 12.6* 15.7  HCT 43.4 41.6 37.0* 45.5  MCV 95.8 95.0 96.4 94.4  PLT 216 202 225 326   Cardiac Enzymes: Recent Labs  Lab 02/23/20 1541  CKTOTAL 2,502*   BNP (last 3 results) No results for input(s): PROBNP in the last 8760 hours. CBG: No results for input(s): GLUCAP in the last 168 hours. D-Dimer: No results for input(s): DDIMER in the last 72 hours. Hgb A1c: No results for input(s): HGBA1C in the last 72 hours. Lipid Profile: No results for input(s): CHOL, HDL, LDLCALC, TRIG, CHOLHDL, LDLDIRECT in the last 72 hours. Thyroid function studies: No results for input(s): TSH, T4TOTAL, T3FREE, THYROIDAB in the last 72 hours.  Invalid input(s): FREET3 Anemia work up: No results for input(s): VITAMINB12, FOLATE, FERRITIN, TIBC, IRON, RETICCTPCT in the last 72 hours. Sepsis Labs: Recent Labs  Lab 02/23/20 1541 02/23/20 1937 02/24/20 0447 02/25/20 0313 02/26/20 1235  PROCALCITON 0.37  --  0.70 0.57  --   WBC 11.6*  --  12.1* 10.7* 13.4*  LATICACIDVEN 1.5 1.2  --   --   --     Microbiology Recent Results (from the past 240 hour(s))  Novel Coronavirus, NAA ([facility])      Status: None   Collection Time: 02/21/20  1:52 PM   Specimen: Nasopharyngeal(NP) swabs in vial transport medium   Nasopharynge  Testing  Result Value Ref Range Status   SARS-CoV-2, NAA Not Detected Not Detected Final    Comment: This nucleic acid amplification  test was developed and its performance characteristics determined by [employer]. Nucleic acid amplification tests include RT-PCR and TMA. This test has not been FDA cleared or approved. This test has been authorized by FDA under an Emergency Use Authorization (EUA). This test is only authorized for the duration of time the declaration that circumstances exist justifying the authorization of the emergency use of in vitro diagnostic tests for detection of SARS-CoV-2 virus and/or diagnosis of COVID-19 infection under section 564(b)(1) of the Act, 21 U.S.C. 485IOE-7(O) (1), unless the authorization is terminated or revoked sooner. When diagnostic testing is negative, the possibility of a false negative result should be considered in the context of a patient's recent exposures and the presence of clinical signs and symptoms consistent with COVID-19. An individual without symptoms of COVID-19 and who is not shedding SARS-CoV-2 virus wo uld expect to have a negative (not detected) result in this assay.   SARS-COV-2, NAA 2 DAY TAT     Status: None   Collection Time: 02/21/20  1:52 PM   Nasopharynge  Testing  Result Value Ref Range Status   SARS-CoV-2, NAA 2 DAY TAT Performed  Final  SARS Coronavirus 2 by RT PCR (hospital order, performed in [place] hospital lab) Nasopharyngeal Nasopharyngeal Swab     Status: None   Collection Time: 02/23/20  3:06 PM   Specimen: Nasopharyngeal Swab  Result Value Ref Range Status   SARS Coronavirus 2 NEGATIVE NEGATIVE Final    Comment: (NOTE) SARS-CoV-2 target nucleic acids are NOT DETECTED.  The SARS-CoV-2 RNA is generally detectable in upper and lower respiratory specimens during the  acute phase of infection. The lowest concentration of SARS-CoV-2 viral copies this assay can detect is 250 copies / mL. A negative result does not preclude SARS-CoV-2 infection and should not be used as the sole basis for treatment or other patient management decisions.  A negative result may occur with improper specimen collection / handling, submission of specimen other than nasopharyngeal swab, presence of viral mutation(s) within the areas targeted by this assay, and inadequate number of viral copies (<250 copies / mL). A negative result must be combined with clinical observations, patient history, and epidemiological information.  Fact Sheet for Patients:   StrictlyIdeas.no  Fact Sheet for Healthcare Providers: BankingDealers.co.za  This test is not yet approved or  cleared by the [place] FDA and has been authorized for detection and/or diagnosis of SARS-CoV-2 by FDA under an Emergency Use Authorization (EUA).  This EUA will remain in effect (meaning this test can be used) for the duration of the COVID-19 declaration under Section 564(b)(1) of the Act, 21 U.S.C. section 360bbb-3(b)(1), unless the authorization is terminated or revoked sooner.  Performed at [facility], [place] [address] [place], [place] [address]   Blood Culture (routine x 2)     Status: None (Preliminary result)   Collection Time: 02/23/20  3:41 PM   Specimen: BLOOD  Result Value Ref Range Status   Specimen Description BLOOD SITE NOT SPECIFIED  Final   Special Requests   Final    BOTTLES DRAWN AEROBIC AND ANAEROBIC Blood Culture results may not be optimal due to an inadequate volume of blood received in culture bottles   Culture   Final    NO GROWTH 2 DAYS Performed at [facility], [place] [address], [place], [place] [address]    Report Status PENDING  Incomplete  Blood Culture (routine x 2)     Status: None (Preliminary result)  Collection  Time: 02/23/20  4:00 PM   Specimen: BLOOD  Result Value Ref Range Status   Specimen Description BLOOD SITE NOT SPECIFIED  Final   Special Requests   Final    BOTTLES DRAWN AEROBIC AND ANAEROBIC Blood Culture results may not be optimal due to an inadequate volume of blood received in culture bottles   Culture   Final    NO GROWTH 2 DAYS Performed at [facility], [place] [address], [place], [place] [address]    Report Status PENDING  Incomplete  Urine culture     Status: None   Collection Time: 02/23/20  5:35 PM   Specimen: In/Out Cath Urine  Result Value Ref Range Status   Specimen Description IN/OUT CATH URINE  Final   Special Requests NONE  Final   Culture   Final    NO GROWTH Performed at [facility], [place] [address], [place], [place] [address]    Report Status 02/24/2020 FINAL  Final    Procedures and diagnostic studies:  No results found.  Medications:   . enoxaparin (LOVENOX) injection  40 mg Subcutaneous Q24H  . folic acid  1 mg Oral Daily  . methylPREDNISolone (SOLU-MEDROL) injection  40 mg Intravenous Q6H  . sodium chloride flush  3 mL Intravenous Q12H   Continuous Infusions: . azithromycin 500 mg (02/25/20 1705)  . cefTRIAXone (ROCEPHIN)  IV 2 g (02/25/20 1550)     LOS: 3 days   Geradine Girt  [facility]   How to contact the [facility] Attending or Consulting provider [place] or covering provider during after hours [place], for this patient?  1. Check the care team in [facility] and look for a) attending/consulting TRH provider listed and b) the [facility] team listed 2. Log into [email or web address] and use [place]'s universal password to access. If you do not have the password, please contact the hospital operator. 3. Locate the [facility] provider you are looking for under [facility] and page to a number that you can be directly reached. 4. If you still have difficulty reaching the provider, please page the [facility] (Director on Call) for the Hospitalists listed on  [email or web address] for assistance.  02/26/2020, 12:46 PM          Progress Note    DMARIO RUSSOM  IDP:[id number] DOB: [birth year]  DOA: 02/23/2020 PCP: Biagio Borg, MD    Brief Narrative:     Medical records reviewed and are as summarized below:  Jason Hart is an 74 y.o. male with medical history significant for psoriatic arthritis, now presenting to the emergency department with fevers, general weakness and malaise, and multiple falls.  Patient reports that his symptoms began on 02/17/2020 with chills and general malaise.  Since then, he has gone on to develop fevers and worsening general weakness with multiple falls.   Assessment/Plan:   Principal Problem:   Community acquired pneumonia of right lower lobe of lung Active Problems:   Psoriatic arthritis ([place])   Hyponatremia   Rhabdomyolysis   Mild renal insufficiency   Multiple falls   Sepsis (POA) secondary to PNA (legionella) - Presents with several days of general weakness, fevers, malaise, and slight cough and is found to be febrile with leukocytosis and RLL pneumonia on CXR  - Blood cultures were collected in ED  -Rocephin and azithromycin : should be able to narrow to azithro soon -added steroids and nebs/pulm toilet -strep pneumo negative, legionella antigen positive - trend procalcitonin  Patient reported loose stools -check c diff as well as GI pathogen panel -no reported risk factors for c diff: no contacts, no recent abx, no recent hospitalizations -? Related to PNA   Hyponatremia  - Serum sodium is 126 on admission in setting of hypovolemia  - Continue NS infusion and repeat chem panel daily  Rhabdomyolysis  - CK is elevated with mild renal insufficiency and "large Hgb" on UA but no RBC on microscopy  - Hold statin, continue IVF hydration, monitor renal function     Mild renal insufficiency  - SCr is 1.26 on admission, up from 1.08 a year ago  - Renally-dose medications, monitor with IVF  hydration    Psoriatic arthritis  - Patient reports this to be well-controlled    Falls  - Patient reports multiple falls recently due to general weakness  - There are no acute neuro deficits on exam, no acute findings on head CT, and this is likely secondary to the acute infectious illness with hyponatremia  - Anticipate improvement with continued IVF hydration and antibiotics -PT eval    Family Communication/Anticipated D/C date and plan/Code Status   DVT prophylaxis: Lovenox ordered. Code Status: Full Code.  Disposition Plan: Status is: Inpatient Called daughterStanton Kidney-- left message as no answer  Remains inpatient appropriate because:IV treatments appropriate due to intensity of illness or inability to take PO   Dispo: The patient is from: Home              Anticipated d/c is to: Home              Anticipated d/c date is: 2 days              Patient currently is not medically stable to d/c.         Medical Consultants:    None.   Subjective:   Has been trying not to cough-- chest feels tight  Objective:    Vitals:   02/25/20 1625 02/25/20 2236 02/26/20 0804 02/26/20 0900  BP: 120/69 119/60 130/67   Pulse: 60 71 (!) 56   Resp: 16 16 16    Temp: 98.4 F (36.9 C) 98 F (36.7 C) (!) 97.2 F (36.2 C)   TempSrc:  Oral Oral   SpO2: 93% 97% 97% 92%  Weight:      Height:        Intake/Output Summary (Last 24 hours) at 02/26/2020 1248 Last data filed at 02/26/2020 0600 Gross per 24 hour  Intake 3 ml  Output 800 ml  Net -797 ml   Filed Weights   02/24/20 1300  Weight: 79.8 kg    Exam:  General: Appearance:     Overweight male in no acute distress     Lungs:     Mild increase in work of breathing, upper airway sounds  Heart:    Bradycardic. Normal rhythm. No murmurs, rubs, or gallops.   MS:   All extremities are intact.   Neurologic:   Awake, alert, oriented x 3. No apparent focal neurological           defect.    Data Reviewed:   I have  personally reviewed following labs and imaging studies:  Labs: Labs show the following:   Basic Metabolic Panel: Recent Labs  Lab 02/23/20 1541 02/23/20 1541 02/24/20 0447 02/25/20 0313  NA 126*  --  127* 128*  K 4.2   < > 4.8 4.0  CL 93*  --  95* 97*  CO2 24  --  21* 24  GLUCOSE 122*  --  119* 111*  BUN 19  --  17 12  CREATININE 1.26*  --  1.04 0.99  CALCIUM 8.1*  --  7.8* 7.6*   < > = values in this interval not displayed.   GFR Estimated Creatinine Clearance: 67.3 mL/min (by C-G formula based on SCr of 0.99 mg/dL). Liver Function Tests: Recent Labs  Lab 02/23/20 1541 02/24/20 0447 02/25/20 0313  AST 63* 76* 60*  ALT 36 40 42  ALKPHOS 106 116 98  BILITOT 1.0 1.0 0.2*  PROT 6.2* 5.3* 4.8*  ALBUMIN 2.8* 2.3* 1.9*   No results for input(s): LIPASE, AMYLASE in the last 168 hours. No results for input(s): AMMONIA in the last 168 hours. Coagulation profile Recent Labs  Lab 02/23/20 1541  INR 1.2    CBC: Recent Labs  Lab 02/23/20 1541 02/24/20 0447 02/25/20 0313 02/26/20 1235  WBC 11.6* 12.1* 10.7* 13.4*  NEUTROABS 10.3* 10.8* 9.0* 12.4*  HGB 14.7 14.0 12.6* 15.7  HCT 43.4 41.6 37.0* 45.5  MCV 95.8 95.0 96.4 94.4  PLT 216 202 225 326   Cardiac Enzymes: Recent Labs  Lab 02/23/20 1541  CKTOTAL 2,502*   BNP (last 3 results) No results for input(s): PROBNP in the last 8760 hours. CBG: No results for input(s): GLUCAP in the last 168 hours. D-Dimer: No results for input(s): DDIMER in the last 72 hours. Hgb A1c: No results for input(s): HGBA1C in the last 72 hours. Lipid Profile: No results for input(s): CHOL, HDL, LDLCALC, TRIG, CHOLHDL, LDLDIRECT in the last 72 hours. Thyroid function studies: No results for input(s): TSH, T4TOTAL, T3FREE, THYROIDAB in the last 72 hours.  Invalid input(s): FREET3 Anemia work up: No results for input(s): VITAMINB12, FOLATE, FERRITIN, TIBC, IRON, RETICCTPCT in the last 72 hours. Sepsis Labs: Recent Labs  Lab  02/23/20 1541 02/23/20 1937 02/24/20 0447 02/25/20 0313 02/26/20 1235  PROCALCITON 0.37  --  0.70 0.57  --   WBC 11.6*  --  12.1* 10.7* 13.4*  LATICACIDVEN 1.5 1.2  --   --   --     Microbiology Recent Results (from the past 240 hour(s))  Novel Coronavirus, NAA ([facility])     Status: None   Collection Time: 02/21/20  1:52 PM   Specimen: Nasopharyngeal(NP) swabs in vial transport medium   Nasopharynge  Testing  Result Value Ref Range Status   SARS-CoV-2, NAA Not Detected Not Detected Final    Comment: This nucleic acid amplification test was developed and its performance characteristics determined by [employer]. Nucleic acid amplification tests include RT-PCR and TMA. This test has not been FDA cleared or approved. This test has been authorized by FDA under an Emergency Use Authorization (EUA). This test is only authorized for the duration of time the declaration that circumstances exist justifying the authorization of the emergency use of in vitro diagnostic tests for detection of SARS-CoV-2 virus and/or diagnosis of COVID-19 infection under section 564(b)(1) of the Act, 21 U.S.C. 106YIR-4(W) (1), unless the authorization is terminated or revoked sooner. When diagnostic testing is negative, the possibility of a false negative result should be considered in the context of a patient's recent exposures and the presence of clinical signs and symptoms consistent with COVID-19. An individual without symptoms of COVID-19 and who is not shedding SARS-CoV-2 virus wo uld expect to have a negative (not detected) result in this assay.   SARS-COV-2, NAA 2 DAY TAT     Status: None   Collection Time: 02/21/20  1:52 PM   Nasopharynge  Testing  Result Value Ref Range Status   SARS-CoV-2, NAA 2 DAY TAT Performed  Final  SARS Coronavirus 2 by RT PCR (hospital order, performed in [facility] hospital lab) Nasopharyngeal Nasopharyngeal Swab     Status: None   Collection Time: 02/23/20   3:06 PM   Specimen: Nasopharyngeal Swab  Result Value Ref Range Status   SARS Coronavirus 2 NEGATIVE NEGATIVE Final    Comment: (NOTE) SARS-CoV-2 target nucleic acids are NOT DETECTED.  The SARS-CoV-2 RNA is generally detectable in upper and lower respiratory specimens during the acute phase of infection. The lowest concentration of SARS-CoV-2 viral copies this assay can detect is 250 copies / mL. A negative result does not preclude SARS-CoV-2 infection and should not be used as the sole basis for treatment or other patient management decisions.  A negative result may occur with improper specimen collection / handling, submission of specimen other than nasopharyngeal swab, presence of viral mutation(s) within the areas targeted by this assay, and inadequate number of viral copies (<250 copies / mL). A negative result must be combined with clinical observations, patient history, and epidemiological information.  Fact Sheet for Patients:   StrictlyIdeas.no  Fact Sheet for Healthcare Providers: BankingDealers.co.za  This test is not yet approved or  cleared by the [place] FDA and has been authorized for detection and/or diagnosis of SARS-CoV-2 by FDA under an Emergency Use Authorization (EUA).  This EUA will remain in effect (meaning this test can be used) for the duration of the COVID-19 declaration under Section 564(b)(1) of the Act, 21 U.S.C. section 360bbb-3(b)(1), unless the authorization is terminated or revoked sooner.  Performed at [facility], [place] [address], [place], [place] [address]   Blood Culture (routine x 2)     Status: None (Preliminary result)   Collection Time: 02/23/20  3:41 PM   Specimen: BLOOD  Result Value Ref Range Status   Specimen Description BLOOD SITE NOT SPECIFIED  Final   Special Requests   Final    BOTTLES DRAWN AEROBIC AND ANAEROBIC Blood Culture results may not be optimal due to an  inadequate volume of blood received in culture bottles   Culture   Final    NO GROWTH 2 DAYS Performed at [facility], [place] [address] [place], [place] [address]    Report Status PENDING  Incomplete  Blood Culture (routine x 2)     Status: None (Preliminary result)   Collection Time: 02/23/20  4:00 PM   Specimen: BLOOD  Result Value Ref Range Status   Specimen Description BLOOD SITE NOT SPECIFIED  Final   Special Requests   Final    BOTTLES DRAWN AEROBIC AND ANAEROBIC Blood Culture results may not be optimal due to an inadequate volume of blood received in culture bottles   Culture   Final    NO GROWTH 2 DAYS Performed at [facility], [place] [address], [place], [place] [address]    Report Status PENDING  Incomplete  Urine culture     Status: None   Collection Time: 02/23/20  5:35 PM   Specimen: In/Out Cath Urine  Result Value Ref Range Status   Specimen Description IN/OUT CATH URINE  Final   Special Requests NONE  Final   Culture   Final    NO GROWTH Performed at [facility], [place] [address] [place], [place] [address]    Report Status 02/24/2020 FINAL  Final  Procedures and diagnostic studies:  No results found.  Medications:   . enoxaparin (LOVENOX) injection  40 mg Subcutaneous Q24H  . folic acid  1 mg Oral Daily  . methylPREDNISolone (SOLU-MEDROL) injection  40 mg Intravenous Q12H  . sodium chloride flush  3 mL Intravenous Q12H   Continuous Infusions: . azithromycin 500 mg (02/25/20 1705)     LOS: 3 days   Geradine Girt  [facility]   How to contact the [facility] Attending or Consulting provider [place] or covering provider during after hours [place], for this patient?  5. Check the care team in [facility] and look for a) attending/consulting TRH provider listed and b) the [facility] team listed 6. Log into [email or web address] and use [place]'s universal password to access. If you do not have the password, please contact the hospital operator. 7. Locate  the [facility] provider you are looking for under [facility] and page to a number that you can be directly reached. 8. If you still have difficulty reaching the provider, please page the [facility] (Director on Call) for the Hospitalists listed on amion for assistance.  02/26/2020, 12:48 PM

## 2020-02-26 NOTE — Care Management Important Message (Signed)
Important Message  Patient Details  Name: Jason Hart MRN: [id number] Date of Birth: 1946-04-12   Medicare Important Message Given:  Yes     Terrie Haring 02/26/2020, 3:00 PM

## 2020-02-26 NOTE — Plan of Care (Signed)
  Problem: Clinical Measurements: Goal: Ability to maintain clinical measurements within normal limits will improve Outcome: Progressing Goal: Will remain free from infection Outcome: Progressing Goal: Diagnostic test results will improve Outcome: Progressing Goal: Respiratory complications will improve Outcome: Progressing   Problem: Activity: Goal: Risk for activity intolerance will decrease Outcome: Progressing   Problem: Coping: Goal: Level of anxiety will decrease Outcome: Progressing   Problem: Elimination: Goal: Will not experience complications related to bowel motility Outcome: Progressing Goal: Will not experience complications related to urinary retention Outcome: Progressing   Problem: Pain Managment: Goal: General experience of comfort will improve Outcome: Progressing   Problem: Safety: Goal: Ability to remain free from injury will improve Outcome: Progressing   Problem: Skin Integrity: Goal: Risk for impaired skin integrity will decrease Outcome: Progressing

## 2020-02-27 LAB — CBC WITH DIFFERENTIAL/PLATELET
Abs Immature Granulocytes: 0.2 10*3/uL — ABNORMAL HIGH (ref 0.00–0.07)
Basophils Absolute: 0.1 10*3/uL (ref 0.0–0.1)
Basophils Relative: 0 %
Eosinophils Absolute: 0 10*3/uL (ref 0.0–0.5)
Eosinophils Relative: 0 %
HCT: 39.8 % (ref 39.0–52.0)
Hemoglobin: 13.6 g/dL (ref 13.0–17.0)
Immature Granulocytes: 1 %
Lymphocytes Relative: 5 %
Lymphs Abs: 0.8 10*3/uL (ref 0.7–4.0)
MCH: 31.9 pg (ref 26.0–34.0)
MCHC: 34.2 g/dL (ref 30.0–36.0)
MCV: 93.2 fL (ref 80.0–100.0)
Monocytes Absolute: 0.3 10*3/uL (ref 0.1–1.0)
Monocytes Relative: 2 %
Neutro Abs: 16.4 10*3/uL — ABNORMAL HIGH (ref 1.7–7.7)
Neutrophils Relative %: 92 %
Platelets: 372 10*3/uL (ref 150–400)
RBC: 4.27 MIL/uL (ref 4.22–5.81)
RDW: 12.6 % (ref 11.5–15.5)
WBC: 17.8 10*3/uL — ABNORMAL HIGH (ref 4.0–10.5)
nRBC: 0 % (ref 0.0–0.2)

## 2020-02-27 LAB — BASIC METABOLIC PANEL
Anion gap: 7 (ref 5–15)
BUN: 20 mg/dL (ref 8–23)
CO2: 25 mmol/L (ref 22–32)
Calcium: 8.3 mg/dL — ABNORMAL LOW (ref 8.9–10.3)
Chloride: 101 mmol/L (ref 98–111)
Creatinine, Ser: 0.79 mg/dL (ref 0.61–1.24)
GFR calc Af Amer: >60 mL/min (ref >60)
GFR calc non Af Amer: >60 mL/min (ref >60)
Glucose, Bld: 158 mg/dL — ABNORMAL HIGH (ref 70–99)
Potassium: 3.8 mmol/L (ref 3.5–5.1)
Sodium: 133 mmol/L — ABNORMAL LOW (ref 135–145)

## 2020-02-27 MED ORDER — PREDNISONE 20 MG PO TABS
40.0000 mg | ORAL_TABLET | Freq: Every day | ORAL | Status: DC
  Administered 2020-02-27: 40 mg via ORAL
  Filled 2020-02-27: qty 2

## 2020-02-27 MED ORDER — PREDNISONE 10 MG PO TABS: ORAL_TABLET | ORAL | 0 refills | Status: DC

## 2020-02-27 MED ORDER — AZITHROMYCIN 500 MG PO TABS
500.0000 mg | ORAL_TABLET | Freq: Every day | ORAL | 0 refills | Status: DC
Start: 2020-02-27 — End: 2020-06-28

## 2020-02-27 MED FILL — predniSONE 10 MG TABS: 10 | 10 days supply | Qty: 21 | Fill #0

## 2020-02-27 MED FILL — AZITHROMYCIN 500 MG TABLET: 500 | 2 days supply | Qty: 2 | Fill #0

## 2020-02-27 NOTE — Discharge Summary (Signed)
Physician Discharge Summary  Jason Hart HMC:[id number] DOB: 12/11/1945 DOA: 02/23/2020  PCP: Biagio Borg, MD  Admit date: 02/23/2020 Discharge date: 02/27/2020  Admitted From: home Discharge disposition: home   Recommendations for Outpatient Follow-Up:   1. Finished treatment for legionella pna 2. BMP/cbc 1 week   Discharge Diagnosis:   Principal Problem:   Community acquired pneumonia of right lower lobe of lung Active Problems:   Psoriatic arthritis ([place])   Hyponatremia   Rhabdomyolysis   Mild renal insufficiency   Multiple falls    Discharge Condition: Improved.  Diet recommendation:  Regular.   Code status: Full.   History of Present Illness:   Jason Hart is a 74 y.o. male with medical history significant for psoriatic arthritis, now presenting to the emergency department with fevers, general weakness and malaise, and multiple falls.  Patient reports that his symptoms began on 02/17/2020 with chills and general malaise.  Since then, he has gone on to develop fevers and worsening general weakness with multiple falls.  He denies losing consciousness with these falls or experiencing any chest pain or palpitations, but explains that he has been feeling profoundly weak in general at times.  He has had a mild cough and dyspnea, no chest pain or abdominal pain, no flank pain, and no vomiting or diarrhea.  He also reports a poor appetite recently.   Hospital Course by Problem:   Sepsis (POA) secondary to PNA(legionella) -Presents with several days of general weakness, fevers, malaise, and slight cough and is found to be febrile with leukocytosis and RLL pneumonia on CXR -Blood cultures were collected in ED  -Rocephin and azithromycin:  narrow to azithro to treat for 7 days -strep pneumo negative, legionella antigen positive   Patient reported loose stools -resolved, suspect related to  legionella  Hyponatremia -improved  Rhabdomyolysis -resolved  Mild renal insufficiency -SCr is 1.26 on admission, up from 1.08 a year ago -stable  Psoriatic arthritis -Patient reports this to be well-controlled   Falls  - Patient reports multiple falls recently due to general weakness  - There are no acute neuro deficits on exam, no acute findings on head CT, and this is likely secondary to the acute infectious illness with hyponatremia  -PT eval   leucocytosis -most likely from steroids -follow outpatient   Medical Consultants:      Discharge Exam:   Vitals:   02/26/20 2235 02/27/20 0757  BP: 135/71 121/69  Pulse: (!) 55 (!) 58  Resp: 17 16  Temp: 97.6 F (36.4 C) 97.6 F (36.4 C)  SpO2: 93% 95%   Vitals:   02/26/20 0900 02/26/20 1620 02/26/20 2235 02/27/20 0757  BP:  121/71 135/71 121/69  Pulse:  64 (!) 55 (!) 58  Resp:  18 17 16   Temp:  (!) 97.3 F (36.3 C) 97.6 F (36.4 C) 97.6 F (36.4 C)  TempSrc:  Oral Oral Oral  SpO2: 92% 94% 93% 95%  Weight:      Height:        General exam: Appears calm and comfortable.   The results of significant diagnostics from this hospitalization (including imaging, microbiology, ancillary and laboratory) are listed below for reference.     Procedures and Diagnostic Studies:   CT Head Wo Contrast  Result Date: 02/23/2020 CLINICAL DATA:  Fall. EXAM: CT HEAD WITHOUT CONTRAST CT CERVICAL SPINE WITHOUT CONTRAST TECHNIQUE: Multidetector CT imaging of the head and cervical spine was performed following the standard protocol without intravenous  contrast. Multiplanar CT image reconstructions of the cervical spine were also generated. COMPARISON:  None. FINDINGS: CT HEAD FINDINGS Brain: Mild chronic ischemic white matter disease is noted. No mass effect or midline shift is noted. Ventricular size is within normal limits. There is no evidence of mass lesion, hemorrhage or acute infarction. Vascular: No  hyperdense vessel or unexpected calcification. Skull: Normal. Negative for fracture or focal lesion. Sinuses/Orbits: No acute finding. Other: None. CT CERVICAL SPINE FINDINGS Alignment: Normal. Skull base and vertebrae: No acute fracture. No primary bone lesion or focal pathologic process. Soft tissues and spinal canal: No prevertebral fluid or swelling. No visible canal hematoma. Disc levels:  Severe degenerative disc disease is noted at C7-T1. Upper chest: Negative. Other: None. IMPRESSION: 1. Mild chronic ischemic white matter disease. No acute intracranial abnormality seen. 2. Severe degenerative disc disease is noted at C7-T1. No acute abnormality seen in the cervical spine. Electronically Signed   By: Marijo Conception M.D.   On: 02/23/2020 19:05   CT Cervical Spine Wo Contrast  Result Date: 02/23/2020 CLINICAL DATA:  Fall. EXAM: CT HEAD WITHOUT CONTRAST CT CERVICAL SPINE WITHOUT CONTRAST TECHNIQUE: Multidetector CT imaging of the head and cervical spine was performed following the standard protocol without intravenous contrast. Multiplanar CT image reconstructions of the cervical spine were also generated. COMPARISON:  None. FINDINGS: CT HEAD FINDINGS Brain: Mild chronic ischemic white matter disease is noted. No mass effect or midline shift is noted. Ventricular size is within normal limits. There is no evidence of mass lesion, hemorrhage or acute infarction. Vascular: No hyperdense vessel or unexpected calcification. Skull: Normal. Negative for fracture or focal lesion. Sinuses/Orbits: No acute finding. Other: None. CT CERVICAL SPINE FINDINGS Alignment: Normal. Skull base and vertebrae: No acute fracture. No primary bone lesion or focal pathologic process. Soft tissues and spinal canal: No prevertebral fluid or swelling. No visible canal hematoma. Disc levels:  Severe degenerative disc disease is noted at C7-T1. Upper chest: Negative. Other: None. IMPRESSION: 1. Mild chronic ischemic white matter disease.  No acute intracranial abnormality seen. 2. Severe degenerative disc disease is noted at C7-T1. No acute abnormality seen in the cervical spine. Electronically Signed   By: Marijo Conception M.D.   On: 02/23/2020 19:05   DG Chest Port 1 View  Result Date: 02/23/2020 CLINICAL DATA:  Fever, weakness EXAM: PORTABLE CHEST 1 VIEW COMPARISON:  None. FINDINGS: There is right basilar consolidation most in keeping with changes of acute lobar pneumonia in the a appropriate clinical setting. Left lung is clear. No pneumothorax or pleural effusion. Cardiac size is within normal limits IMPRESSION: Right lower lobar pneumonia. Electronically Signed   By: Fidela Salisbury MD   On: 02/23/2020 16:17     Labs:   Basic Metabolic Panel: Recent Labs  Lab 02/23/20 1541 02/23/20 1541 02/24/20 6761 02/24/20 9509 02/25/20 0313 02/25/20 0313 02/26/20 1235 02/27/20 0315  NA 126*  --  127*  --  128*  --  135 133*  K 4.2   < > 4.8   < > 4.0   < > 4.2 3.8  CL 93*  --  95*  --  97*  --  101 101  CO2 24  --  21*  --  24  --  25 25  GLUCOSE 122*  --  119*  --  111*  --  265* 158*  BUN 19  --  17  --  12  --  17 20  CREATININE 1.26*  --  1.04  --  0.99  --  0.86 0.79  CALCIUM 8.1*  --  7.8*  --  7.6*  --  8.5* 8.3*   < > = values in this interval not displayed.   GFR Estimated Creatinine Clearance: 83.3 mL/min (by C-G formula based on SCr of 0.79 mg/dL). Liver Function Tests: Recent Labs  Lab 02/23/20 1541 02/24/20 0447 02/25/20 0313 02/26/20 1235  AST 63* 76* 60* 76*  ALT 36 40 42 79*  ALKPHOS 106 116 98 116  BILITOT 1.0 1.0 0.2* 0.5  PROT 6.2* 5.3* 4.8* 5.6*  ALBUMIN 2.8* 2.3* 1.9* 2.1*   No results for input(s): LIPASE, AMYLASE in the last 168 hours. No results for input(s): AMMONIA in the last 168 hours. Coagulation profile Recent Labs  Lab 02/23/20 1541  INR 1.2    CBC: Recent Labs  Lab 02/23/20 1541 02/24/20 0447 02/25/20 0313 02/26/20 1235 02/27/20 0315  WBC 11.6* 12.1* 10.7* 13.4*  17.8*  NEUTROABS 10.3* 10.8* 9.0* 12.4* 16.4*  HGB 14.7 14.0 12.6* 15.7 13.6  HCT 43.4 41.6 37.0* 45.5 39.8  MCV 95.8 95.0 96.4 94.4 93.2  PLT 216 202 225 326 372   Cardiac Enzymes: Recent Labs  Lab 02/23/20 1541  CKTOTAL 2,502*   BNP: Invalid input(s): POCBNP CBG: No results for input(s): GLUCAP in the last 168 hours. D-Dimer No results for input(s): DDIMER in the last 72 hours. Hgb A1c No results for input(s): HGBA1C in the last 72 hours. Lipid Profile No results for input(s): CHOL, HDL, LDLCALC, TRIG, CHOLHDL, LDLDIRECT in the last 72 hours. Thyroid function studies No results for input(s): TSH, T4TOTAL, T3FREE, THYROIDAB in the last 72 hours.  Invalid input(s): FREET3 Anemia work up No results for input(s): VITAMINB12, FOLATE, FERRITIN, TIBC, IRON, RETICCTPCT in the last 72 hours. Microbiology Recent Results (from the past 240 hour(s))  Novel Coronavirus, NAA ([facility])     Status: None   Collection Time: 02/21/20  1:52 PM   Specimen: Nasopharyngeal(NP) swabs in vial transport medium   Nasopharynge  Testing  Result Value Ref Range Status   SARS-CoV-2, NAA Not Detected Not Detected Final    Comment: This nucleic acid amplification test was developed and its performance characteristics determined by [employer]. Nucleic acid amplification tests include RT-PCR and TMA. This test has not been FDA cleared or approved. This test has been authorized by FDA under an Emergency Use Authorization (EUA). This test is only authorized for the duration of time the declaration that circumstances exist justifying the authorization of the emergency use of in vitro diagnostic tests for detection of SARS-CoV-2 virus and/or diagnosis of COVID-19 infection under section 564(b)(1) of the Act, 21 U.S.C. 093ATF-5(D) (1), unless the authorization is terminated or revoked sooner. When diagnostic testing is negative, the possibility of a false negative result should be considered in  the context of a patient's recent exposures and the presence of clinical signs and symptoms consistent with COVID-19. An individual without symptoms of COVID-19 and who is not shedding SARS-CoV-2 virus wo uld expect to have a negative (not detected) result in this assay.   SARS-COV-2, NAA 2 DAY TAT     Status: None   Collection Time: 02/21/20  1:52 PM   Nasopharynge  Testing  Result Value Ref Range Status   SARS-CoV-2, NAA 2 DAY TAT Performed  Final  SARS Coronavirus 2 by RT PCR (hospital order, performed in [place] hospital lab) Nasopharyngeal Nasopharyngeal Swab     Status: None   Collection Time: 02/23/20  3:06 PM  Specimen: Nasopharyngeal Swab  Result Value Ref Range Status   SARS Coronavirus 2 NEGATIVE NEGATIVE Final    Comment: (NOTE) SARS-CoV-2 target nucleic acids are NOT DETECTED.  The SARS-CoV-2 RNA is generally detectable in upper and lower respiratory specimens during the acute phase of infection. The lowest concentration of SARS-CoV-2 viral copies this assay can detect is 250 copies / mL. A negative result does not preclude SARS-CoV-2 infection and should not be used as the sole basis for treatment or other patient management decisions.  A negative result may occur with improper specimen collection / handling, submission of specimen other than nasopharyngeal swab, presence of viral mutation(s) within the areas targeted by this assay, and inadequate number of viral copies (<250 copies / mL). A negative result must be combined with clinical observations, patient history, and epidemiological information.  Fact Sheet for Patients:   StrictlyIdeas.no  Fact Sheet for Healthcare Providers: BankingDealers.co.za  This test is not yet approved or  cleared by the [place] FDA and has been authorized for detection and/or diagnosis of SARS-CoV-2 by FDA under an Emergency Use Authorization (EUA).  This EUA will remain in  effect (meaning this test can be used) for the duration of the COVID-19 declaration under Section 564(b)(1) of the Act, 21 U.S.C. section 360bbb-3(b)(1), unless the authorization is terminated or revoked sooner.  Performed at [facility], [place] [address] [place], [place] [address]   Blood Culture (routine x 2)     Status: None (Preliminary result)   Collection Time: 02/23/20  3:41 PM   Specimen: BLOOD  Result Value Ref Range Status   Specimen Description BLOOD SITE NOT SPECIFIED  Final   Special Requests   Final    BOTTLES DRAWN AEROBIC AND ANAEROBIC Blood Culture results may not be optimal due to an inadequate volume of blood received in culture bottles   Culture   Final    NO GROWTH 4 DAYS Performed at [facility], [place] [address] [place], [place] [address]    Report Status PENDING  Incomplete  Blood Culture (routine x 2)     Status: None (Preliminary result)   Collection Time: 02/23/20  4:00 PM   Specimen: BLOOD  Result Value Ref Range Status   Specimen Description BLOOD SITE NOT SPECIFIED  Final   Special Requests   Final    BOTTLES DRAWN AEROBIC AND ANAEROBIC Blood Culture results may not be optimal due to an inadequate volume of blood received in culture bottles   Culture   Final    NO GROWTH 4 DAYS Performed at [facility], [place] [address], [place], [place] [address]    Report Status PENDING  Incomplete  Urine culture     Status: None   Collection Time: 02/23/20  5:35 PM   Specimen: In/Out Cath Urine  Result Value Ref Range Status   Specimen Description IN/OUT CATH URINE  Final   Special Requests NONE  Final   Culture   Final    NO GROWTH Performed at [facility], [place] [address], [place], [place] [address]    Report Status 02/24/2020 FINAL  Final     Discharge Instructions:   Discharge Instructions    Diet - low sodium heart healthy   Complete by: As directed    Discharge instructions   Complete by: As directed    Follow up with  PCP to ensure resolution of your PNA and to resume methotrexate   Increase activity slowly  Complete by: As directed      Allergies as of 02/27/2020   No Known Allergies     Medication List    STOP taking these medications   methotrexate 2.5 MG tablet Commonly known as: RHEUMATREX     TAKE these medications   azithromycin 500 MG tablet Commonly known as: Zithromax Take 1 tablet (500 mg total) by mouth daily. End date: 2/33/4356   folic acid 1 MG tablet Commonly known as: FOLVITE Take 1 tablet (1 mg total) by mouth daily. What changed: when to take this   pravastatin 20 MG tablet Commonly known as: PRAVACHOL TAKE ONE TABLET BY MOUTH DAILY   predniSONE 10 MG tablet Commonly known as: DELTASONE 40 mg x 2 days then 30 mg x 2 days then 20 mg x 2 days then 10 mg x 2 days then 5 mg x 2 days   tiZANidine 2 MG tablet Commonly known as: ZANAFLEX Take 1 tablet (2 mg total) by mouth every 6 (six) hours as needed for muscle spasms.       Follow-up Information    Biagio Borg, MD Follow up in 1 week(s).   Specialties: Internal Medicine, Radiology Contact information: [place] [address] [phone]                Time coordinating discharge: 35 min  Signed:  Geradine Girt DO  [facility] 02/27/2020, 8:53 AM

## 2020-02-27 NOTE — TOC Transition Note (Signed)
Transition of Care [facility]) - CM/SW Discharge Note   Patient Details  Name: Jason Hart MRN: [id number] Date of Birth: 01-01-1946  [facility] CM/SW Contact:  Angelita Ingles, RN Phone Number: [phone]  02/27/2020, 10:17 AM   Clinical Narrative:   TOC consulted to set patient up with home health services. CM at bedside to inform patient and offer choice. Patient states that he does not want physical therapy. He states that he has his own morning exercise routine and he is not interested in having PT come to his home. Patient feels that he is active without difficultly and his weakness was from the pneumonia. MD made aware.    Final next level of care: Home/Self Care Barriers to Discharge: No Barriers Identified   Patient Goals and CMS Choice Patient states their goals for this hospitalization and ongoing recovery are:: Just ready to go home CMS Medicare.gov Compare Post Acute Care list provided to:: Patient Choice offered to / list presented to : Patient  Discharge Placement                       Discharge Plan and Services                DME Arranged: N/A DME Agency: NA       HH Arranged: Patient Refused Nectar Agency: NA        Social Determinants of Health (SDOH) Interventions     Readmission Risk Interventions No flowsheet data found.

## 2020-02-27 NOTE — Progress Notes (Signed)
Physical Therapy Treatment Patient Details Name: Jason Hart MRN: [id number] DOB: 1946-02-03 Today's Date: 02/27/2020    History of Present Illness 74 y/o male with a h/o arthritis, BPH, conductive hearing loss, hyperlipidemia, psoriatic arthritis, who presents to ED on 7/16 with falls, weakness. Pt with medical diagnosis of sepsis 2* R lobar PNA (legionella).    PT Comments    Pt with progressed ambulation distance today, and demonstrates improved steadiness and speed of gait. Pt still with mild difficulty with higher level dynamic balance tasks, especially head turning during gait, but pt aware and states he will take his time mobilizing at home. SpO2 94-95% on RA during gait, no evidence of dyspnea. Pt to d/c home with assist of his daughter, plans to d/c today if medically ready.    Follow Up Recommendations  Supervision for mobility/OOB (pt politely declines HHPT)     Equipment Recommendations  None recommended by PT    Recommendations for Other Services       Precautions / Restrictions Precautions Precautions: Fall Precaution Comments: x3 falls at home Restrictions Weight Bearing Restrictions: No    Mobility  Bed Mobility Overal bed mobility: Modified Independent             General bed mobility comments: pt standing at EOB upon PT arrival to room  Transfers Overall transfer level: Modified independent   Transfers: Sit to/from Stand           General transfer comment: pt standing EOB upon PT arrival to room  Ambulation/Gait Ambulation/Gait assistance: Supervision Gait Distance (Feet): 600 Feet Assistive device: None Gait Pattern/deviations: Decreased stride length;Step-through pattern;Shuffle Gait velocity: slightly decr   General Gait Details: supervision for safety, pt attempting to take longer strides this day and increase gait speed. Tolerated challenge moderately well, mild imbalance but pt able to correct (see balance section). SpO2  94-95% on RA during gait   Stairs             Wheelchair Mobility    Modified Rankin (Stroke Patients Only)       Balance Overall balance assessment: Needs assistance;History of Falls Sitting-balance support: No upper extremity supported Sitting balance-Leahy Scale: Good     Standing balance support: No upper extremity supported;During functional activity Standing balance-Leahy Scale: Good               High level balance activites: Backward walking;Direction changes;Head turns;Other (comment) High Level Balance Comments: mild unsteadiness and weaving of gait with repeated horizontal and vertical head turns, over 25-ft distance. Increased time for backward walking x10 ft, bending down to pick up object, and stepping over obstacles.            Cognition Arousal/Alertness: Awake/alert Behavior During Therapy: WFL for tasks assessed/performed Overall Cognitive Status: Within Functional Limits for tasks assessed                                        Exercises      General Comments        Pertinent Vitals/Pain Pain Assessment: No/denies pain    Home Living                      Prior Function            PT Goals (current goals can now be found in the care plan section) Acute Rehab PT Goals Patient Stated Goal: go  home PT Goal Formulation: With patient Time For Goal Achievement: 03/11/20 Potential to Achieve Goals: Good Progress towards PT goals: Progressing toward goals    Frequency    Min 3X/week      PT Plan Current plan remains appropriate    Co-evaluation              AM-PAC PT "6 Clicks" Mobility   Outcome Measure  Help needed turning from your back to your side while in a flat bed without using bedrails?: None Help needed moving from lying on your back to sitting on the side of a flat bed without using bedrails?: None Help needed moving to and from a bed to a chair (including a wheelchair)?:  None Help needed standing up from a chair using your arms (e.g., wheelchair or bedside chair)?: None Help needed to walk in hospital room?: A Little Help needed climbing 3-5 steps with a railing? : A Little 6 Click Score: 22    End of Session   Activity Tolerance: Patient tolerated treatment well Patient left: with call bell/phone within reach;in bed Nurse Communication: Mobility status PT Visit Diagnosis: Other abnormalities of gait and mobility (R26.89)     Time: 4585-9292 PT Time Calculation (min) (ACUTE ONLY): 10 min  Charges:  $Neuromuscular Re-education: 8-22 mins                     Brodric Schauer E, [place] Pager [phone]  Office [phone]   Joleigh Mineau D Karenna Romanoff 02/27/2020, 10:41 AM

## 2020-02-28 ENCOUNTER — Encounter: Payer: Medicare HMO | Admitting: Internal Medicine

## 2020-02-28 LAB — CULTURE, BLOOD (ROUTINE X 2)
Culture: NO GROWTH
Culture: NO GROWTH

## 2020-03-05 ENCOUNTER — Other Ambulatory Visit: Payer: Self-pay

## 2020-03-05 ENCOUNTER — Encounter: Payer: Self-pay | Admitting: Internal Medicine

## 2020-03-05 ENCOUNTER — Ambulatory Visit (INDEPENDENT_AMBULATORY_CARE_PROVIDER_SITE_OTHER): Payer: Medicare HMO | Admitting: Internal Medicine

## 2020-03-05 VITALS — BP 120/60 | HR 77 | Temp 98.0°F | Ht 67.0 in | Wt 174.0 lb

## 2020-03-05 DIAGNOSIS — E559 Vitamin D deficiency, unspecified: Secondary | ICD-10-CM

## 2020-03-05 DIAGNOSIS — R739 Hyperglycemia, unspecified: Secondary | ICD-10-CM | POA: Diagnosis not present

## 2020-03-05 DIAGNOSIS — J189 Pneumonia, unspecified organism: Secondary | ICD-10-CM

## 2020-03-05 DIAGNOSIS — E538 Deficiency of other specified B group vitamins: Secondary | ICD-10-CM | POA: Diagnosis not present

## 2020-03-05 DIAGNOSIS — R030 Elevated blood-pressure reading, without diagnosis of hypertension: Secondary | ICD-10-CM | POA: Diagnosis not present

## 2020-03-05 DIAGNOSIS — Z0000 Encounter for general adult medical examination without abnormal findings: Secondary | ICD-10-CM

## 2020-03-05 NOTE — Progress Notes (Signed)
Subjective:    Patient ID: Jason Hart, male    DOB: June 15, 1946, 74 y.o.   MRN: [id number]  HPI  Here after confusion and multiple falls and slightly hit head on pillar in the garage. hospd with pna - legionella positive. Pt denies fever, wt loss, night sweats, loss of appetite, or other constitutional symptoms, and no cough, wheezing.  Pt denies chest pain, increased sob or doe, wheezing, orthopnea, PND, increased LE swelling, palpitations, dizziness or syncope.  Pt denies new neurological symptoms such as new headache, or facial or extremity weakness or numbness   Pt denies polydipsia, polyuria Past Medical History:  Diagnosis Date  . Arthritis   . BPH (benign prostatic hypertrophy)   . Conductive hearing loss   . Hyperlipidemia   . Psoriatic arthritis ([place])   . Trigger point with temporomandibular joint pain    Past Surgical History:  Procedure Laterality Date  . COLONOSCOPY  2006   Negative;due 2016; [facility]  . TONSILLECTOMY      reports that he has never smoked. He has never used smokeless tobacco. He reports that he does not drink alcohol and does not use drugs. family history includes Benign prostatic hyperplasia in his brother; Coronary artery disease (age of onset: 73) in his brother; Heart attack (age of onset: 54) in his father; Heart attack (age of onset: 12) in his paternal grandfather; Hyperlipidemia in his father; Prostate cancer (age of onset: 22) in his brother; Sudden death in his father; Throat cancer in his mother. No Known Allergies Current Outpatient Medications on File Prior to Visit  Medication Sig Dispense Refill  . azithromycin (ZITHROMAX) 500 MG tablet Take 1 tablet (500 mg total) by mouth daily. End date: 02/29/2020 2 tablet 0  . folic acid (FOLVITE) 1 MG tablet Take 1 tablet (1 mg total) by mouth daily. (Patient taking differently: Take 1 mg by mouth once a week. ) 90 tablet 3  . pravastatin (PRAVACHOL) 20 MG tablet TAKE ONE TABLET BY MOUTH DAILY 90  tablet 0  . predniSONE (DELTASONE) 10 MG tablet 40 mg x 2 days then 30 mg x 2 days then 20 mg x 2 days then 10 mg x 2 days then 5 mg x 2 days 21 tablet 0  . tiZANidine (ZANAFLEX) 2 MG tablet Take 1 tablet (2 mg total) by mouth every 6 (six) hours as needed for muscle spasms. 40 tablet 0   No current facility-administered medications on file prior to visit.   Review of Systems All otherwise neg per pt     Objective:   Physical Exam BP (!) 120/60 (BP Location: Left Arm, Patient Position: Sitting, Cuff Size: Large)   Pulse 77   Temp 98 F (36.7 C) (Oral)   Ht 5\' 7"  (1.702 m)   Wt 174 lb (78.9 kg)   SpO2 97%   BMI 27.25 kg/m  VS noted,  Constitutional: Pt appears in NAD HENT: Head: NCAT.  Right Ear: External ear normal.  Left Ear: External ear normal.  Eyes: . Pupils are equal, round, and reactive to light. Conjunctivae and EOM are normal Nose: without d/c or deformity Neck: Neck supple. Gross normal ROM Cardiovascular: Normal rate and regular rhythm.   Pulmonary/Chest: Effort normal and breath sounds without rales or wheezing.  Abd:  Soft, NT, ND, + BS, no organomegaly Neurological: Pt is alert. At baseline orientation, motor grossly intact Skin: Skin is warm. No rashes, other new lesions, no LE edema Psychiatric: Pt behavior is normal without  agitation  All otherwise neg per pt   Lab Results  Component Value Date   WBC 17.8 (H) 02/27/2020   HGB 13.6 02/27/2020   HCT 39.8 02/27/2020   PLT 372 02/27/2020   GLUCOSE 158 (H) 02/27/2020   CHOL 170 02/24/2019   TRIG 113.0 02/24/2019   HDL 47.70 02/24/2019   LDLDIRECT 137.3 10/25/2009   LDLCALC 100 (H) 02/24/2019   ALT 79 (H) 02/26/2020   AST 76 (H) 02/26/2020   NA 133 (L) 02/27/2020   K 3.8 02/27/2020   CL 101 02/27/2020   CREATININE 0.79 02/27/2020   BUN 20 02/27/2020   CO2 25 02/27/2020   TSH 2.27 02/24/2019   PSA 1.09 02/24/2019   INR 1.2 02/23/2020   HGBA1C 5.4 04/19/2008       Assessment & Plan:

## 2020-03-05 NOTE — Patient Instructions (Signed)
Please continue all other medications as before, including finishing the prednisone as you are.  Please have the pharmacy call with any other refills you may need.  Please continue your efforts at being more active, low cholesterol diet, and weight control.  Please keep your appointments with your specialists as you may have planned - dr Amil Amen  Please make an Appointment to return in Aug 2021 as you have planned, also with Lab Appointment for testing done 3-5 days before at the [facility] (so this is for TWO appointments - please see the scheduling desk as you leave)

## 2020-03-07 ENCOUNTER — Encounter: Payer: Self-pay | Admitting: Internal Medicine

## 2020-03-07 MED ORDER — FESOTERODINE FUMARATE ER 4 MG PO TB24: 4.0000 mg | ORAL_TABLET | Freq: Every day | ORAL | 11 refills | Status: DC

## 2020-03-10 ENCOUNTER — Encounter: Payer: Self-pay | Admitting: Internal Medicine

## 2020-03-10 NOTE — Assessment & Plan Note (Addendum)
Resolved,  to f/u any worsening symptoms or concerns  I spent 31 minutes in preparing to see the patient by review of recent labs, imaging and procedures, obtaining and reviewing separately obtained history, communicating with the patient and family or caregiver, ordering medications, tests or procedures, and documenting clinical information in the EHR including the differential Dx, treatment, and any further evaluation and other management of pna, hyperglycemia, elev BP without htn

## 2020-03-10 NOTE — Assessment & Plan Note (Signed)
BP Readings from Last 3 Encounters:  03/05/20 (!) 120/60  02/27/20 121/69  02/24/19 140/90  stable overall by history and exam, recent data reviewed with pt, and pt to continue medical treatment as before,  to f/u any worsening symptoms or concerns'

## 2020-03-10 NOTE — Assessment & Plan Note (Signed)
Lab Results  Component Value Date   HGBA1C 5.4 04/19/2008  stable overall by history and exam, recent data reviewed with pt, and pt to continue medical treatment as before,  to f/u any worsening symptoms or concerns, for a1c with next labs

## 2020-03-21 ENCOUNTER — Other Ambulatory Visit: Payer: Self-pay

## 2020-03-21 ENCOUNTER — Other Ambulatory Visit: Payer: Medicare HMO

## 2020-03-21 DIAGNOSIS — E785 Hyperlipidemia, unspecified: Secondary | ICD-10-CM | POA: Diagnosis not present

## 2020-03-21 DIAGNOSIS — R739 Hyperglycemia, unspecified: Secondary | ICD-10-CM

## 2020-03-21 DIAGNOSIS — Z0000 Encounter for general adult medical examination without abnormal findings: Secondary | ICD-10-CM

## 2020-03-21 DIAGNOSIS — E559 Vitamin D deficiency, unspecified: Secondary | ICD-10-CM | POA: Diagnosis not present

## 2020-03-21 DIAGNOSIS — E538 Deficiency of other specified B group vitamins: Secondary | ICD-10-CM

## 2020-03-21 LAB — COMPLETE METABOLIC PANEL WITH GFR
AG Ratio: 1.8 (calc) (ref 1.0–2.5)
ALT: 24 U/L (ref 9–46)
AST: 21 U/L (ref 10–35)
Albumin: 4 g/dL (ref 3.6–5.1)
Alkaline phosphatase (APISO): 78 U/L (ref 35–144)
BUN: 13 mg/dL (ref 7–25)
CO2: 31 mmol/L (ref 20–32)
Calcium: 9.3 mg/dL (ref 8.6–10.3)
Chloride: 103 mmol/L (ref 98–110)
Creat: 0.96 mg/dL (ref 0.70–1.18)
GFR, Est African American: 91 mL/min/{1.73_m2} (ref >=60)
GFR, Est Non African American: 78 mL/min/{1.73_m2} (ref >=60)
Globulin: 2.2 g/dL (calc) (ref 1.9–3.7)
Glucose, Bld: 96 mg/dL (ref 65–99)
Potassium: 4.2 mmol/L (ref 3.5–5.3)
Sodium: 139 mmol/L (ref 135–146)
Total Bilirubin: 0.9 mg/dL (ref 0.2–1.2)
Total Protein: 6.2 g/dL (ref 6.1–8.1)

## 2020-03-22 ENCOUNTER — Other Ambulatory Visit: Payer: Medicare HMO

## 2020-03-22 ENCOUNTER — Encounter: Payer: Self-pay | Admitting: Internal Medicine

## 2020-03-22 LAB — CBC WITH DIFFERENTIAL/PLATELET
Absolute Monocytes: 583 cells/uL (ref 200–950)
Basophils Absolute: 31 cells/uL (ref 0–200)
Basophils Relative: 0.5 %
Eosinophils Absolute: 174 cells/uL (ref 15–500)
Eosinophils Relative: 2.8 %
HCT: 44.4 % (ref 38.5–50.0)
Hemoglobin: 15.3 g/dL (ref 13.2–17.1)
Lymphs Abs: 2077 cells/uL (ref 850–3900)
MCH: 32.8 pg (ref 27.0–33.0)
MCHC: 34.5 g/dL (ref 32.0–36.0)
MCV: 95.1 fL (ref 80.0–100.0)
MPV: 10.1 fL (ref 7.5–12.5)
Monocytes Relative: 9.4 %
Neutro Abs: 3336 cells/uL (ref 1500–7800)
Neutrophils Relative %: 53.8 %
Platelets: 184 10*3/uL (ref 140–400)
RBC: 4.67 10*6/uL (ref 4.20–5.80)
RDW: 12.9 % (ref 11.0–15.0)
Total Lymphocyte: 33.5 %
WBC: 6.2 10*3/uL (ref 3.8–10.8)

## 2020-03-22 LAB — URINALYSIS, ROUTINE W REFLEX MICROSCOPIC
Bilirubin Urine: NEGATIVE
Glucose, UA: NEGATIVE
Hgb urine dipstick: NEGATIVE
Ketones, ur: NEGATIVE
Leukocytes,Ua: NEGATIVE
Nitrite: NEGATIVE
Protein, ur: NEGATIVE
Specific Gravity, Urine: 1.01 (ref 1.001–1.03)
pH: 7 (ref 5.0–8.0)

## 2020-03-22 LAB — LIPID PANEL
Cholesterol: 164 mg/dL (ref <200)
HDL: 47 mg/dL (ref >=40)
LDL Cholesterol (Calc): 98 mg/dL (calc)
Non-HDL Cholesterol (Calc): 117 mg/dL (calc) (ref <130)
Total CHOL/HDL Ratio: 3.5 (calc) (ref <5.0)
Triglycerides: 94 mg/dL (ref <150)

## 2020-03-22 LAB — VITAMIN D 25 HYDROXY (VIT D DEFICIENCY, FRACTURES): Vit D, 25-Hydroxy: 35 ng/mL (ref 30–100)

## 2020-03-22 LAB — HEMOGLOBIN A1C
Hgb A1c MFr Bld: 5.7 % of total Hgb — ABNORMAL HIGH (ref <5.7)
Mean Plasma Glucose: 117 (calc)
eAG (mmol/L): 6.5 (calc)

## 2020-03-22 LAB — PSA: PSA: 1.1 ng/mL (ref <=4.0)

## 2020-03-22 LAB — TSH: TSH: 2.1 mIU/L (ref 0.40–4.50)

## 2020-03-22 LAB — VITAMIN B12: Vitamin B-12: 448 pg/mL (ref 200–1100)

## 2020-03-27 ENCOUNTER — Ambulatory Visit (INDEPENDENT_AMBULATORY_CARE_PROVIDER_SITE_OTHER): Payer: Medicare HMO | Admitting: Internal Medicine

## 2020-03-27 ENCOUNTER — Encounter: Payer: Self-pay | Admitting: Internal Medicine

## 2020-03-27 ENCOUNTER — Other Ambulatory Visit: Payer: Self-pay

## 2020-03-27 VITALS — BP 140/80 | HR 74 | Temp 97.8°F | Ht 67.5 in | Wt 170.0 lb

## 2020-03-27 DIAGNOSIS — Z0000 Encounter for general adult medical examination without abnormal findings: Secondary | ICD-10-CM | POA: Diagnosis not present

## 2020-03-27 DIAGNOSIS — R739 Hyperglycemia, unspecified: Secondary | ICD-10-CM

## 2020-03-27 DIAGNOSIS — E785 Hyperlipidemia, unspecified: Secondary | ICD-10-CM | POA: Diagnosis not present

## 2020-03-27 DIAGNOSIS — E559 Vitamin D deficiency, unspecified: Secondary | ICD-10-CM | POA: Diagnosis not present

## 2020-03-27 NOTE — Progress Notes (Signed)
Subjective:    Patient ID: Jason Hart, male    DOB: 1946/01/14, 74 y.o.   MRN: [id number]  HPI  Here for wellness and f/u;  Overall doing ok;  Pt denies Chest pain, worsening SOB, DOE, wheezing, orthopnea, PND, worsening LE edema, palpitations, dizziness or syncope.  Pt denies neurological change such as new headache, facial or extremity weakness.  Pt denies polydipsia, polyuria, or low sugar symptoms. Pt states overall good compliance with treatment and medications, good tolerability, and has been trying to follow appropriate diet.  Pt denies worsening depressive symptoms, suicidal ideation or panic. No fever, night sweats, wt loss, loss of appetite, or other constitutional symptoms.  Pt states good ability with ADL's, has low fall risk, home safety reviewed and adequate, no other significant changes in hearing or vision, and only occasionally active with exercise.  No new complaints , Past Medical History:  Diagnosis Date  . Arthritis   . BPH (benign prostatic hypertrophy)   . Conductive hearing loss   . Hyperlipidemia   . Psoriatic arthritis ([place])   . Trigger point with temporomandibular joint pain    Past Surgical History:  Procedure Laterality Date  . COLONOSCOPY  2006   Negative;due 2016; [facility]  . TONSILLECTOMY      reports that he has never smoked. He has never used smokeless tobacco. He reports that he does not drink alcohol and does not use drugs. family history includes Benign prostatic hyperplasia in his brother; Coronary artery disease (age of onset: 90) in his brother; Heart attack (age of onset: 66) in his father; Heart attack (age of onset: 30) in his paternal grandfather; Hyperlipidemia in his father; Prostate cancer (age of onset: 65) in his brother; Sudden death in his father; Throat cancer in his mother. Current Outpatient Medications on File Prior to Visit  Medication Sig Dispense Refill  . fesoterodine (TOVIAZ) 4 MG TB24 tablet Take 1 tablet (4 mg total)  by mouth daily. 30 tablet 11  . folic acid (FOLVITE) 1 MG tablet Take 1 tablet (1 mg total) by mouth daily. (Patient taking differently: Take 1 mg by mouth once a week. ) 90 tablet 3  . methotrexate (RHEUMATREX) 2.5 MG tablet Take 2.5 mg by mouth once a week. Caution:Chemotherapy. Protect from light.  4 tablets    . pravastatin (PRAVACHOL) 20 MG tablet TAKE ONE TABLET BY MOUTH DAILY 90 tablet 0  . tiZANidine (ZANAFLEX) 2 MG tablet Take 1 tablet (2 mg total) by mouth every 6 (six) hours as needed for muscle spasms. 40 tablet 0  . azithromycin (ZITHROMAX) 500 MG tablet Take 1 tablet (500 mg total) by mouth daily. End date: 02/29/2020 (Patient not taking: Reported on 03/27/2020) 2 tablet 0  . predniSONE (DELTASONE) 10 MG tablet 40 mg x 2 days then 30 mg x 2 days then 20 mg x 2 days then 10 mg x 2 days then 5 mg x 2 days (Patient not taking: Reported on 03/27/2020) 21 tablet 0   No current facility-administered medications on file prior to visit.   Review of Systems All otherwise neg per pt    Objective:   Physical Exam BP 140/80 (BP Location: Right Arm, Patient Position: Sitting, Cuff Size: Normal)   Pulse 74   Temp 97.8 F (36.6 C) (Oral)   Ht 5' 7.5" (1.715 m)   Wt 170 lb (77.1 kg)   SpO2 97%   BMI 26.23 kg/m  VS noted,  Constitutional: Pt appears in NAD HENT: Head:  NCAT.  Right Ear: External ear normal.  Left Ear: External ear normal.  Eyes: . Pupils are equal, round, and reactive to light. Conjunctivae and EOM are normal Nose: without d/c or deformity Neck: Neck supple. Gross normal ROM Cardiovascular: Normal rate and regular rhythm.   Pulmonary/Chest: Effort normal and breath sounds without rales or wheezing.  Abd:  Soft, NT, ND, + BS, no organomegaly Neurological: Pt is alert. At baseline orientation, motor grossly intact Skin: Skin is warm. No rashes, other new lesions, no LE edema Psychiatric: Pt behavior is normal without agitation  Lab Results  Component Value Date   WBC  6.2 03/21/2020   HGB 15.3 03/21/2020   HCT 44.4 03/21/2020   PLT 184 03/21/2020   GLUCOSE 96 03/21/2020   CHOL 164 03/21/2020   TRIG 94 03/21/2020   HDL 47 03/21/2020   LDLDIRECT 137.3 10/25/2009   LDLCALC 98 03/21/2020   ALT 24 03/21/2020   AST 21 03/21/2020   NA 139 03/21/2020   K 4.2 03/21/2020   CL 103 03/21/2020   CREATININE 0.96 03/21/2020   BUN 13 03/21/2020   CO2 31 03/21/2020   TSH 2.10 03/21/2020   PSA 1.1 03/21/2020   INR 1.2 02/23/2020   HGBA1C 5.7 (H) 03/21/2020          Assessment & Plan:

## 2020-03-27 NOTE — Patient Instructions (Addendum)
We have discussed the Cardiac CT Score test to measure the calcification level (if any) in your heart arteries.  This test has been ordered in our Computer System, so please call [facility] directly, as they prefer this, at [phone] to be scheduled.  Please continue all other medications as before, and refills have been done if requested.  Please have the pharmacy call with any other refills you may need.  Please continue your efforts at being more active, low cholesterol diet, and weight control.  You are otherwise up to date with prevention measures today.  Please keep your appointments with your specialists as you may have planned  Please make an Appointment to return for your 1 year visit, or sooner if needed, with Lab testing by Appointment as well, to be done about 3-5 days before at the FIRST FLO[facility] Lab (so this is for TWO appointments - please see the scheduling desk as you leave)    

## 2020-03-30 ENCOUNTER — Encounter: Payer: Self-pay | Admitting: Internal Medicine

## 2020-03-30 NOTE — Assessment & Plan Note (Signed)

## 2020-03-30 NOTE — Assessment & Plan Note (Signed)
stable overall by history and exam, recent data reviewed with pt, and pt to continue medical treatment as before,  to f/u any worsening symptoms or concerns  

## 2020-03-30 NOTE — Assessment & Plan Note (Signed)
[facility] for card ct score

## 2020-04-04 DIAGNOSIS — Z20822 Contact with and (suspected) exposure to covid-19: Secondary | ICD-10-CM | POA: Diagnosis not present

## 2020-04-12 DIAGNOSIS — H2513 Age-related nuclear cataract, bilateral: Secondary | ICD-10-CM | POA: Diagnosis not present

## 2020-04-12 DIAGNOSIS — H524 Presbyopia: Secondary | ICD-10-CM | POA: Diagnosis not present

## 2020-04-12 DIAGNOSIS — H353131 Nonexudative age-related macular degeneration, bilateral, early dry stage: Secondary | ICD-10-CM | POA: Diagnosis not present

## 2020-04-23 ENCOUNTER — Ambulatory Visit: Payer: Medicare HMO | Attending: Internal Medicine

## 2020-04-23 DIAGNOSIS — Z23 Encounter for immunization: Secondary | ICD-10-CM

## 2020-04-23 NOTE — Progress Notes (Signed)
   Covid-19 Vaccination Clinic  Name:  SHIGERU LAMPERT    MRN: [id number] DOB: 1946/04/27  04/23/2020  Mr. Cush was observed post Covid-19 immunization for 15 minutes without incident. He was provided with Vaccine Information Sheet and instruction to access the V-Safe system.   Mr. Broner was instructed to call 911 with any severe reactions post vaccine: Marland Kitchen Difficulty breathing  . Swelling of face and throat  . A fast heartbeat  . A bad rash all over body  . Dizziness and weakness

## 2020-05-01 ENCOUNTER — Ambulatory Visit (INDEPENDENT_AMBULATORY_CARE_PROVIDER_SITE_OTHER)
Admission: RE | Admit: 2020-05-01 | Discharge: 2020-05-01 | Disposition: A | Discharge status: Home / Self Care | Payer: Self-pay | Source: Ambulatory Visit | Attending: Internal Medicine | Admitting: Internal Medicine

## 2020-05-01 ENCOUNTER — Other Ambulatory Visit: Payer: Self-pay

## 2020-05-01 DIAGNOSIS — E785 Hyperlipidemia, unspecified: Secondary | ICD-10-CM

## 2020-05-01 DIAGNOSIS — R739 Hyperglycemia, unspecified: Secondary | ICD-10-CM | POA: Diagnosis not present

## 2020-05-01 DIAGNOSIS — Z23 Encounter for immunization: Secondary | ICD-10-CM | POA: Diagnosis not present

## 2020-05-02 ENCOUNTER — Encounter: Payer: Self-pay | Admitting: Internal Medicine

## 2020-05-02 DIAGNOSIS — I700 Atherosclerosis of aorta: Secondary | ICD-10-CM | POA: Insufficient documentation

## 2020-05-03 ENCOUNTER — Other Ambulatory Visit: Payer: Self-pay | Admitting: Internal Medicine

## 2020-05-03 DIAGNOSIS — E785 Hyperlipidemia, unspecified: Secondary | ICD-10-CM

## 2020-05-03 NOTE — Telephone Encounter (Signed)
Please refill as per office routine med refill policy (all routine meds refilled for 3 mo or monthly per pt preference up to one year from last visit, then month to month grace period for 3 mo, then further med refills will have to be denied)  

## 2020-05-06 ENCOUNTER — Other Ambulatory Visit: Payer: Self-pay | Admitting: Internal Medicine

## 2020-05-06 ENCOUNTER — Encounter: Payer: Self-pay | Admitting: Internal Medicine

## 2020-05-06 DIAGNOSIS — R931 Abnormal findings on diagnostic imaging of heart and coronary circulation: Secondary | ICD-10-CM

## 2020-05-21 DIAGNOSIS — Z6826 Body mass index (BMI) 26.0-26.9, adult: Secondary | ICD-10-CM | POA: Diagnosis not present

## 2020-05-21 DIAGNOSIS — E663 Overweight: Secondary | ICD-10-CM | POA: Diagnosis not present

## 2020-05-21 DIAGNOSIS — L4050 Arthropathic psoriasis, unspecified: Secondary | ICD-10-CM | POA: Diagnosis not present

## 2020-05-30 ENCOUNTER — Ambulatory Visit: Payer: Medicare HMO | Admitting: Cardiology

## 2020-05-30 NOTE — Progress Notes (Deleted)
Cardiology Office Note    Date:  05/30/2020   ID:  ADON GEHLHAUSEN, DOB 10-05-1945, MRN [id number]  PCP:  Biagio Borg, MD  Cardiologist:  Fransico Him, MD   No chief complaint on file.   History of Present Illness:  Jason Hart is a 74 y.o. male who is being seen today for the evaluation of *** at the request of Biagio Borg, MD.    Past Medical History:  Diagnosis Date  . Arthritis   . BPH (benign prostatic hypertrophy)   . Conductive hearing loss   . Hyperlipidemia   . Psoriatic arthritis ([place])   . Trigger point with temporomandibular joint pain     Past Surgical History:  Procedure Laterality Date  . COLONOSCOPY  2006   Negative;due 2016; [facility]  . TONSILLECTOMY      Current Medications: No outpatient medications have been marked as taking for the 05/30/20 encounter (Appointment) with Sueanne Margarita, MD.    Allergies:   Patient has no known allergies.   Social History   Socioeconomic History  . Marital status: Married    Spouse name: Not on file  . Number of children: Not on file  . Years of education: Not on file  . Highest education level: Not on file  Occupational History  . Occupation: Scientist, clinical (histocompatibility and immunogenetics): [employer]  Tobacco Use  . Smoking status: Never Smoker  . Smokeless tobacco: Never Used  Vaping Use  . Vaping Use: Never used  Substance and Sexual Activity  . Alcohol use: No    Alcohol/week: 0.0 standard drinks    Comment: not since MTX started; occasional  . Drug use: No  . Sexual activity: Not on file  Other Topics Concern  . Not on file  Social History Narrative   Fun/Hobby: Air cabin crew    Social Determinants of Health   Financial Resource Strain:   . Difficulty of Paying Living Expenses: Not on file  Food Insecurity:   . Worried About Charity fundraiser in the Last Year: Not on file  . Ran Out of Food in the Last Year: Not on file  Transportation Needs:   . Lack of Transportation (Medical): Not on  file  . Lack of Transportation (Non-Medical): Not on file  Physical Activity:   . Days of Exercise per Week: Not on file  . Minutes of Exercise per Session: Not on file  Stress:   . Feeling of Stress : Not on file  Social Connections:   . Frequency of Communication with Friends and Family: Not on file  . Frequency of Social Gatherings with Friends and Family: Not on file  . Attends Religious Services: Not on file  . Active Member of Clubs or Organizations: Not on file  . Attends Archivist Meetings: Not on file  . Marital Status: Not on file     Family History:  The patient's ***family history includes Benign prostatic hyperplasia in his brother; Coronary artery disease (age of onset: 72) in his brother; Heart attack (age of onset: 26) in his father; Heart attack (age of onset: 12) in his paternal grandfather; Hyperlipidemia in his father; Prostate cancer (age of onset: 85) in his brother; Sudden death in his father; Throat cancer in his mother.   ROS:   Please see the history of present illness.    ROS All other systems reviewed and are negative.  No flowsheet data found.     PHYSICAL EXAM:  VS:  There were no vitals taken for this visit.   GEN: Well nourished, well developed, in no acute distress  HEENT: normal  Neck: no JVD, carotid bruits, or masses Cardiac: ***RRR; no murmurs, rubs, or gallops,no edema.  Intact distal pulses bilaterally.  Respiratory:  clear to auscultation bilaterally, normal work of breathing GI: soft, nontender, nondistended, + BS MS: no deformity or atrophy  Skin: warm and dry, no rash Neuro:  Alert and Oriented x 3, Strength and sensation are intact Psych: euthymic mood, full affect  Wt Readings from Last 3 Encounters:  03/27/20 170 lb (77.1 kg)  03/05/20 174 lb (78.9 kg)  02/24/20 175 lb 14.8 oz (79.8 kg)      Studies/Labs Reviewed:   EKG:  EKG is*** ordered today.  The ekg ordered today demonstrates ***  Recent  Labs: 03/21/2020: ALT 24; BUN 13; Creat 0.96; Hemoglobin 15.3; Platelets 184; Potassium 4.2; Sodium 139; TSH 2.10   Lipid Panel    Component Value Date/Time   CHOL 164 03/21/2020 0820   CHOL 150 02/13/2015 0923   TRIG 94 03/21/2020 0820   TRIG 100 02/13/2015 0923   HDL 47 03/21/2020 0820   HDL 46 02/13/2015 0923   CHOLHDL 3.5 03/21/2020 0820   VLDL 22.6 02/24/2019 1109   LDLCALC 98 03/21/2020 0820   LDLCALC 84 02/13/2015 0923   LDLDIRECT 137.3 10/25/2009 0000    Additional studies/ records that were reviewed today include:  ***    ASSESSMENT:    No diagnosis found.   PLAN:  In order of problems listed above:  1. ***    Medication Adjustments/Labs and Tests Ordered: Current medicines are reviewed at length with the patient today.  Concerns regarding medicines are outlined above.  Medication changes, Labs and Tests ordered today are listed in the Patient Instructions below.  There are no Patient Instructions on file for this visit.   Signed, Fransico Him, MD  05/30/2020 7:00 AM    [address], [place], [place]  [address] Phone: [phone]; Fax: [phone]

## 2020-06-13 DIAGNOSIS — R69 Illness, unspecified: Secondary | ICD-10-CM | POA: Diagnosis not present

## 2020-06-28 ENCOUNTER — Other Ambulatory Visit: Payer: Self-pay

## 2020-06-28 ENCOUNTER — Ambulatory Visit: Payer: Medicare HMO | Admitting: Cardiology

## 2020-06-28 ENCOUNTER — Encounter: Payer: Self-pay | Admitting: Cardiology

## 2020-06-28 VITALS — BP 122/72 | HR 73 | Ht 67.5 in | Wt 167.4 lb

## 2020-06-28 DIAGNOSIS — I700 Atherosclerosis of aorta: Secondary | ICD-10-CM | POA: Diagnosis not present

## 2020-06-28 DIAGNOSIS — R931 Abnormal findings on diagnostic imaging of heart and coronary circulation: Secondary | ICD-10-CM | POA: Diagnosis not present

## 2020-06-28 DIAGNOSIS — E7800 Pure hypercholesterolemia, unspecified: Secondary | ICD-10-CM | POA: Diagnosis not present

## 2020-06-28 MED ORDER — METOPROLOL TARTRATE 100 MG PO TABS: 100.0000 mg | ORAL_TABLET | Freq: Once | ORAL | 0 refills | Status: DC

## 2020-06-28 MED ORDER — PRAVASTATIN SODIUM 40 MG PO TABS: 40.0000 mg | ORAL_TABLET | Freq: Every evening | ORAL | 3 refills | Status: DC

## 2020-06-28 NOTE — Addendum Note (Signed)
Addended by: Antonieta Iba on: 06/28/2020 02:01 PM   Modules accepted: Orders

## 2020-06-28 NOTE — Patient Instructions (Addendum)
Medication Instructions:  Your physician has recommended you make the following change in your medication:  1) INCREASE pravastatin to 40 mg daily  *If you need a refill on your cardiac medications before your next appointment, please call your pharmacy*   Lab Work: Fasting lipids and ALT in 6 weeks If you have labs (blood work) drawn today and your tests are completely normal, you will receive your results only by: Marland Kitchen MyChart Message (if you have MyChart) OR . A paper copy in the mail If you have any lab test that is abnormal or we need to change your treatment, we will call you to review the results.   Testing/Procedures: Your physician has recommended that you have a coronary CTA scan done. Please see next page for further instructions.   Follow-Up: At [facility], you and your health needs are our priority.  As part of our continuing mission to provide you with exceptional heart care, we have created designated Provider Care Teams.  These Care Teams include your primary Cardiologist (physician) and Advanced Practice Providers (APPs -  Physician Assistants and Nurse Practitioners) who all work together to provide you with the care you need, when you need it.  Your next appointment:   1 year(s)  The format for your next appointment:   In Person  Provider:   You may see Fransico Him, MD or one of the following Advanced Practice Providers on your designated Care Team:    Melina Copa, PA-C  Ermalinda Barrios, PA-C  Other Instructions Your cardiac CT will be scheduled at:   [facility] [address] [place], [place] [address] [phone]  Please arrive at the [facility] main entrance of [facility] 30 minutes prior to test start time. Proceed to the [facility] Radiology Department (first floor) to check-in and test prep.  Please follow these instructions carefully (unless otherwise directed):  Hold all erectile dysfunction medications at least 3 days  (72 hrs) prior to test.  On the Night Before the Test: . Be sure to Drink plenty of water. . Do not consume any caffeinated/decaffeinated beverages or chocolate 12 hours prior to your test. . Do not take any antihistamines 12 hours prior to your test.  On the Day of the Test: . Drink plenty of water. Do not drink any water within one hour of the test. . Do not eat any food 4 hours prior to the test. . You may take your regular medications prior to the test.  . Take metoprolol (Lopressor) two hours prior to test.  After the Test: . Drink plenty of water. . After receiving IV contrast, you may experience a mild flushed feeling. This is normal. . On occasion, you may experience a mild rash up to 24 hours after the test. This is not dangerous. If this occurs, you can take Benadryl 25 mg and increase your fluid intake. . If you experience trouble breathing, this can be serious. If it is severe call 911 IMMEDIATELY. If it is mild, please call our office.   Once we have confirmed authorization from your insurance company, we will call you to set up a date and time for your test. Based on how quickly your insurance processes prior authorizations requests, please allow up to 4 weeks to be contacted for scheduling your Cardiac CT appointment. Be advised that routine Cardiac CT appointments could be scheduled as many as 8 weeks after your provider has ordered it.  For non-scheduling related questions, please contact  the cardiac imaging nurse navigator should you have any questions/concerns: Marchia Bond, Cardiac Imaging Nurse Navigator Burley Saver, Interim Cardiac Imaging Nurse [place] [facility] Direct Office Dial: [phone]   For scheduling needs, including cancellations and rescheduling, please call [place], [phone] (temporary number).

## 2020-06-28 NOTE — Progress Notes (Signed)
Cardiology Office Note    Date:  06/28/2020   ID:  Jason Hart, DOB 1945-11-29, MRN [id number]  PCP:  Biagio Borg, MD  Cardiologist:  Fransico Him, MD   Chief Complaint  Patient presents with  . New Patient (Initial Visit)    coronary artery calcifications    History of Present Illness:  Jason Hart is a 74 y.o. male who is being seen today for the evaluation of coronary artery calcifications noted on chest CT at the request of Biagio Borg, MD.  This is a 74yo male with a hx of HLD, psoriatic arthritis and BPH who recently underwent screening coronary CA scoring to assess cardiac risk.  His calcium score was elevated at 225 but only 39th % for age and sex matched controls.  He also has aortic atherosclerosis.  He denies any chest pain or pressure, SOB, DOE, PND, orthopnea, LE edema, dizziness, palpitations or syncope. e is compliant with his meds and is tolerating meds with no SE.  He has never smoked but has a strong exposure hx of second hand smoke from both parents growing up.  He also has a fm hx of premature CAD.  His father died of an SCD/MI at age 70 and his brother had an MI at age 59 and paternal GF had an MI at 78.    Past Medical History:  Diagnosis Date  . Arthritis   . BPH (benign prostatic hypertrophy)   . Conductive hearing loss   . Hyperlipidemia   . Psoriatic arthritis ([place])   . Trigger point with temporomandibular joint pain     Past Surgical History:  Procedure Laterality Date  . COLONOSCOPY  2006   Negative;due 2016; [facility]  . TONSILLECTOMY      Current Medications: Current Meds  Medication Sig  . fesoterodine (TOVIAZ) 4 MG TB24 tablet Take 1 tablet (4 mg total) by mouth daily.  . folic acid (FOLVITE) 1 MG tablet Take 1 tablet (1 mg total) by mouth daily.  . methotrexate (RHEUMATREX) 2.5 MG tablet Take 2.5 mg by mouth once a week. Caution:Chemotherapy. Protect from light.  4 tablets  . pravastatin (PRAVACHOL) 20 MG tablet TAKE  ONE TABLET BY MOUTH DAILY    Allergies:   Patient has no known allergies.   Social History   Socioeconomic History  . Marital status: Widowed    Spouse name: Not on file  . Number of children: Not on file  . Years of education: Not on file  . Highest education level: Not on file  Occupational History  . Occupation: Scientist, clinical (histocompatibility and immunogenetics): [employer]  Tobacco Use  . Smoking status: Never Smoker  . Smokeless tobacco: Never Used  Vaping Use  . Vaping Use: Never used  Substance and Sexual Activity  . Alcohol use: No    Alcohol/week: 0.0 standard drinks    Comment: not since MTX started; occasional  . Drug use: No  . Sexual activity: Not on file  Other Topics Concern  . Not on file  Social History Narrative   Fun/Hobby: Air cabin crew    Social Determinants of Health   Financial Resource Strain:   . Difficulty of Paying Living Expenses: Not on file  Food Insecurity:   . Worried About Charity fundraiser in the Last Year: Not on file  . Ran Out of Food in the Last Year: Not on file  Transportation Needs:   . Lack of Transportation (Medical): Not on file  .  Lack of Transportation (Non-Medical): Not on file  Physical Activity:   . Days of Exercise per Week: Not on file  . Minutes of Exercise per Session: Not on file  Stress:   . Feeling of Stress : Not on file  Social Connections:   . Frequency of Communication with Friends and Family: Not on file  . Frequency of Social Gatherings with Friends and Family: Not on file  . Attends Religious Services: Not on file  . Active Member of Clubs or Organizations: Not on file  . Attends Archivist Meetings: Not on file  . Marital Status: Not on file     Family History:  The patient's family history includes Benign prostatic hyperplasia in his brother; CAD in his brother and brother; Coronary artery disease (age of onset: 43) in his brother; Heart attack (age of onset: 75) in his father; Heart attack (age of onset: 59) in  his paternal grandfather; Hyperlipidemia in his father; Prostate cancer in his brother; Sudden death in his father; Throat cancer in his mother.   ROS:   Please see the history of present illness.    ROS All other systems reviewed and are negative.  PAD Screen 06/28/2020  Previous PAD dx? No  Previous surgical procedure? No  Pain with walking? No  Feet/toe relief with dangling? No  Painful, non-healing ulcers? No  Extremities discolored? No       PHYSICAL EXAM:   VS:  BP 122/72   Pulse 73   Ht 5' 7.5" (1.715 m)   Wt 167 lb 6.4 oz (75.9 kg)   PF 98 L/min   BMI 25.83 kg/m    GEN: Well nourished, well developed, in no acute distress  HEENT: normal  Neck: no JVD, carotid bruits, or masses Cardiac: RRR; no murmurs, rubs, or gallops,no edema.  Intact distal pulses bilaterally.  Respiratory:  clear to auscultation bilaterally, normal work of breathing GI: soft, nontender, nondistended, + BS MS: no deformity or atrophy  Skin: warm and dry, no rash Neuro:  Alert and Oriented x 3, Strength and sensation are intact Psych: euthymic mood, full affect  Wt Readings from Last 3 Encounters:  06/28/20 167 lb 6.4 oz (75.9 kg)  03/27/20 170 lb (77.1 kg)  03/05/20 174 lb (78.9 kg)      Studies/Labs Reviewed:   EKG:  EKG is not ordered today.  The ekg ordered today demonstrates NSR with rSR on EKG from July 2021  Recent Labs: 03/21/2020: ALT 24; BUN 13; Creat 0.96; Hemoglobin 15.3; Platelets 184; Potassium 4.2; Sodium 139; TSH 2.10   Lipid Panel    Component Value Date/Time   CHOL 164 03/21/2020 0820   CHOL 150 02/13/2015 0923   TRIG 94 03/21/2020 0820   TRIG 100 02/13/2015 0923   HDL 47 03/21/2020 0820   HDL 46 02/13/2015 0923   CHOLHDL 3.5 03/21/2020 0820   VLDL 22.6 02/24/2019 1109   LDLCALC 98 03/21/2020 0820   LDLCALC 84 02/13/2015 0923   LDLDIRECT 137.3 10/25/2009 0000      Additional studies/ records that were reviewed today include:  Office notes from PCP,  EKG    ASSESSMENT:    1. Agatston coronary artery calcium score between 200 and 399   2. Aortic atherosclerosis (HCC)   3. Pure hypercholesterolemia      PLAN:  In order of problems listed above:  1.  Coronary artery calcifications on chest CT -calcium score was elevated at 225 but only 39th % for age and  sex matched controls. -he is asymptomatic but has a strong fm hx of premature CAD with his father dying of an MI at 28 and his brother having an MI at 59 and GF at 8.  -his CRFs include premature fm hs, extensive second hand smoke exposure as a child and HLD  2.  Aortic atherosclerosis -noted on Chest CT -continue statin  2.  HLD -LDL was 98 in August and goal is < 70 -increase pravachol to 40mg  daily -repeat FLP and ALT in 6 weeks   Medication Adjustments/Labs and Tests Ordered: Current medicines are reviewed at length with the patient today.  Concerns regarding medicines are outlined above.  Medication changes, Labs and Tests ordered today are listed in the Patient Instructions below.  There are no Patient Instructions on file for this visit.   Signed, Fransico Him, MD  06/28/2020 1:46 PM    [facility] [address], [place], [place]  [address] Phone: [phone]; Fax: [phone]

## 2020-07-15 DIAGNOSIS — X32XXXD Exposure to sunlight, subsequent encounter: Secondary | ICD-10-CM | POA: Diagnosis not present

## 2020-07-15 DIAGNOSIS — L570 Actinic keratosis: Secondary | ICD-10-CM | POA: Diagnosis not present

## 2020-07-15 DIAGNOSIS — L821 Other seborrheic keratosis: Secondary | ICD-10-CM | POA: Diagnosis not present

## 2020-07-15 DIAGNOSIS — D225 Melanocytic nevi of trunk: Secondary | ICD-10-CM | POA: Diagnosis not present

## 2020-08-15 ENCOUNTER — Other Ambulatory Visit: Payer: Medicare HMO

## 2020-08-15 ENCOUNTER — Other Ambulatory Visit: Payer: Self-pay

## 2020-08-15 DIAGNOSIS — R931 Abnormal findings on diagnostic imaging of heart and coronary circulation: Secondary | ICD-10-CM | POA: Diagnosis not present

## 2020-08-15 DIAGNOSIS — I700 Atherosclerosis of aorta: Secondary | ICD-10-CM | POA: Diagnosis not present

## 2020-08-15 DIAGNOSIS — E7800 Pure hypercholesterolemia, unspecified: Secondary | ICD-10-CM

## 2020-08-15 LAB — LIPID PANEL
Chol/HDL Ratio: 3.2 ratio (ref 0.0–5.0)
Cholesterol, Total: 155 mg/dL (ref 100–199)
HDL: 48 mg/dL (ref >39)
LDL Chol Calc (NIH): 89 mg/dL (ref 0–99)
Triglycerides: 97 mg/dL (ref 0–149)
VLDL Cholesterol Cal: 18 mg/dL (ref 5–40)

## 2020-08-15 LAB — ALT: ALT: 12 IU/L (ref 0–44)

## 2020-08-16 ENCOUNTER — Telehealth: Payer: Self-pay

## 2020-08-16 DIAGNOSIS — E7800 Pure hypercholesterolemia, unspecified: Secondary | ICD-10-CM

## 2020-08-16 MED ORDER — PRAVASTATIN SODIUM 80 MG PO TABS: 80.0000 mg | ORAL_TABLET | Freq: Every evening | ORAL | 3 refills | Status: DC

## 2020-08-16 NOTE — Telephone Encounter (Signed)
-----   Message from Sueanne Margarita, MD sent at 08/16/2020 11:50 AM EST ----- LDL still not at goal of < 70 - increase pravachol to 80mg  daily and repeat FLp and ALT in 6 weeks

## 2020-08-16 NOTE — Telephone Encounter (Signed)
The patient has been notified of the result and verbalized understanding.  All questions (if any) were answered. Antonieta Iba, RN 08/16/2020 3:48 PM

## 2020-08-21 DIAGNOSIS — L4050 Arthropathic psoriasis, unspecified: Secondary | ICD-10-CM | POA: Diagnosis not present

## 2020-09-02 ENCOUNTER — Other Ambulatory Visit: Payer: Medicare HMO | Admitting: *Deleted

## 2020-09-02 ENCOUNTER — Telehealth (HOSPITAL_COMMUNITY): Payer: Self-pay | Admitting: *Deleted

## 2020-09-02 ENCOUNTER — Other Ambulatory Visit: Payer: Self-pay

## 2020-09-02 DIAGNOSIS — E7800 Pure hypercholesterolemia, unspecified: Secondary | ICD-10-CM | POA: Diagnosis not present

## 2020-09-02 DIAGNOSIS — I700 Atherosclerosis of aorta: Secondary | ICD-10-CM

## 2020-09-02 DIAGNOSIS — R931 Abnormal findings on diagnostic imaging of heart and coronary circulation: Secondary | ICD-10-CM | POA: Diagnosis not present

## 2020-09-02 LAB — BASIC METABOLIC PANEL
BUN/Creatinine Ratio: 16 (ref 10–24)
BUN: 18 mg/dL (ref 8–27)
CO2: 25 mmol/L (ref 20–29)
Calcium: 9.3 mg/dL (ref 8.6–10.2)
Chloride: 102 mmol/L (ref 96–106)
Creatinine, Ser: 1.13 mg/dL (ref 0.76–1.27)
GFR calc Af Amer: 74 mL/min/{1.73_m2} (ref >59)
GFR calc non Af Amer: 64 mL/min/{1.73_m2} (ref >59)
Glucose: 92 mg/dL (ref 65–99)
Potassium: 4.4 mmol/L (ref 3.5–5.2)
Sodium: 141 mmol/L (ref 134–144)

## 2020-09-02 NOTE — Telephone Encounter (Signed)
Reaching out to patient to offer assistance regarding upcoming cardiac imaging study; pt verbalizes understanding of appt date/time, parking situation and where to check in, pre-test NPO status and medications ordered, and verified current allergies; name and call back number provided for further questions should they arise  Leathia Farnell RN Navigator Cardiac Imaging [facility] [phone] office [phone] cell  

## 2020-09-04 ENCOUNTER — Ambulatory Visit (HOSPITAL_COMMUNITY)
Admission: RE | Admit: 2020-09-04 | Discharge: 2020-09-04 | Disposition: A | Discharge status: Home / Self Care | Payer: Medicare HMO | Source: Ambulatory Visit | Attending: Cardiology | Admitting: Cardiology

## 2020-09-04 ENCOUNTER — Other Ambulatory Visit: Payer: Self-pay

## 2020-09-04 ENCOUNTER — Encounter (HOSPITAL_COMMUNITY): Payer: Self-pay

## 2020-09-04 DIAGNOSIS — R931 Abnormal findings on diagnostic imaging of heart and coronary circulation: Secondary | ICD-10-CM | POA: Insufficient documentation

## 2020-09-04 DIAGNOSIS — E7800 Pure hypercholesterolemia, unspecified: Secondary | ICD-10-CM | POA: Diagnosis present

## 2020-09-04 DIAGNOSIS — I700 Atherosclerosis of aorta: Secondary | ICD-10-CM | POA: Insufficient documentation

## 2020-09-04 MED ORDER — NITROGLYCERIN 0.4 MG SL SUBL
SUBLINGUAL_TABLET | SUBLINGUAL | Status: AC
  Administered 2020-09-04: 0.8 mg via SUBLINGUAL
  Filled 2020-09-04: qty 2

## 2020-09-04 MED ORDER — NITROGLYCERIN 0.4 MG SL SUBL: 0.8000 mg | SUBLINGUAL_TABLET | Freq: Once | SUBLINGUAL | Status: AC

## 2020-09-04 MED ORDER — IOHEXOL 350 MG/ML SOLN
80.0000 mL | Freq: Once | INTRAVENOUS | Status: AC | PRN
  Administered 2020-09-04: 80 mL via INTRAVENOUS

## 2020-09-09 ENCOUNTER — Ambulatory Visit (HOSPITAL_COMMUNITY)
Admission: RE | Admit: 2020-09-09 | Discharge: 2020-09-09 | Disposition: A | Discharge status: Home / Self Care | Payer: Medicare HMO | Source: Ambulatory Visit | Attending: Cardiology | Admitting: Cardiology

## 2020-09-09 DIAGNOSIS — E7800 Pure hypercholesterolemia, unspecified: Secondary | ICD-10-CM | POA: Insufficient documentation

## 2020-09-09 DIAGNOSIS — I700 Atherosclerosis of aorta: Secondary | ICD-10-CM | POA: Diagnosis not present

## 2020-09-09 DIAGNOSIS — R931 Abnormal findings on diagnostic imaging of heart and coronary circulation: Secondary | ICD-10-CM | POA: Insufficient documentation

## 2020-09-26 ENCOUNTER — Telehealth: Payer: Self-pay

## 2020-09-26 MED ORDER — ASPIRIN EC 81 MG PO TBEC: 81.0000 mg | DELAYED_RELEASE_TABLET | Freq: Every day | ORAL | 3 refills | Status: AC

## 2020-09-26 NOTE — Telephone Encounter (Signed)
Left message for patient with results. Advised to call back with any questions.  

## 2020-09-26 NOTE — Telephone Encounter (Signed)
-----   Message from Sueanne Margarita, MD sent at 09/09/2020 11:47 AM EST ----- Patient has mild to moderate CAD of the LAD and RCA.  Does not appear obstructive but has been sent for FFR flow analysis.  He should have a repeat FLP pending in a few weeks after increasing statin.  Add ASA 81mg  daily.

## 2020-10-08 ENCOUNTER — Other Ambulatory Visit: Payer: Medicare HMO

## 2020-10-08 ENCOUNTER — Other Ambulatory Visit: Payer: Self-pay

## 2020-10-08 DIAGNOSIS — E7800 Pure hypercholesterolemia, unspecified: Secondary | ICD-10-CM

## 2020-10-08 LAB — LIPID PANEL
Chol/HDL Ratio: 2.7 ratio (ref 0.0–5.0)
Cholesterol, Total: 142 mg/dL (ref 100–199)
HDL: 53 mg/dL (ref >39)
LDL Chol Calc (NIH): 73 mg/dL (ref 0–99)
Triglycerides: 82 mg/dL (ref 0–149)
VLDL Cholesterol Cal: 16 mg/dL (ref 5–40)

## 2020-10-08 LAB — ALT: ALT: 19 IU/L (ref 0–44)

## 2020-10-13 IMAGING — CT CT CARDIAC CORONARY ARTERY CALCIUM SCORE
3 series · 14 of 20 positions shown, 15 images · non-contrast
Comparison: None.
COMPARISON: None.

Addendum:
EXAM:
OVER-READ INTERPRETATION  CT CHEST

The following report is an over-read performed by radiologist Dr.
Kimo Hinostroza [REDACTED] on 05/01/2020. This
over-read does not include interpretation of cardiac or coronary
anatomy or pathology. The coronary calcium score interpretation by
the cardiologist is attached.
CLINICAL DATA: Risk stratification
Coronary Calcium Score
TECHNIQUE: The patient was scanned on a Siemens scanner. Axial non-contrast 3
mm slices were carried out through the heart. The data set was
analyzed on a dedicated work station and scored using the Agatson
method.

[Series 2: casc 3.0 bv41 2 bestdiast 70 % · axial · 0.34mm/px · z∈[-263,-173]mm · 4 of 50 slices shown, 5 images]
[im 10/50  vessel]
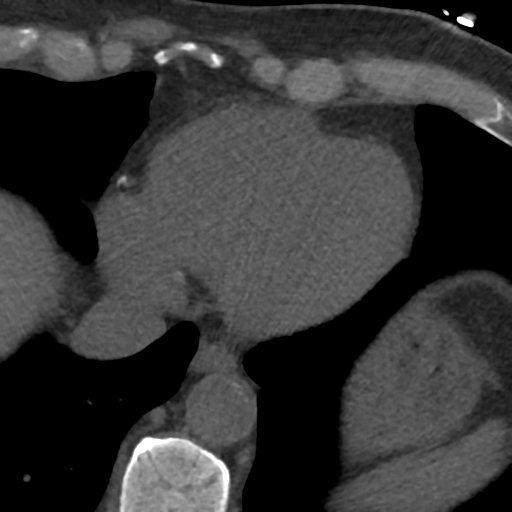
[im 10/50  lung]
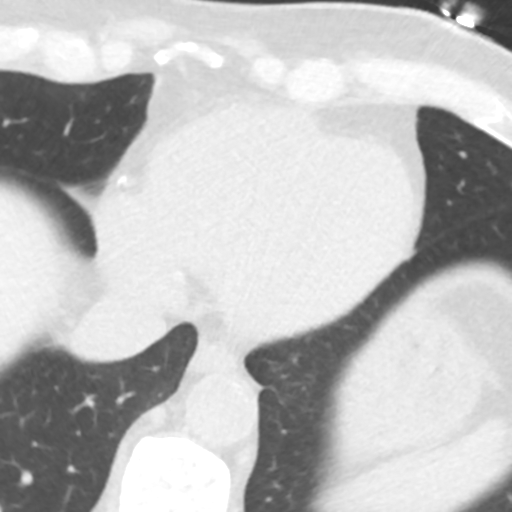
[im 20/50  vessel]
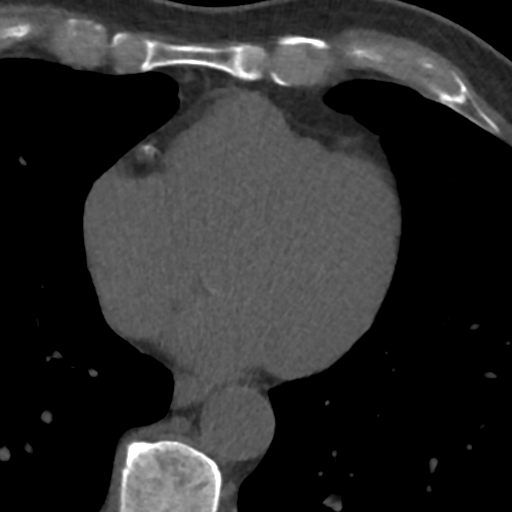
[im 30/50  vessel]
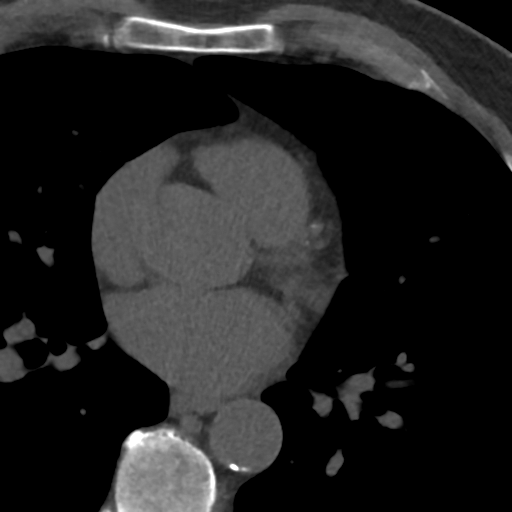
[im 40/50  vessel]
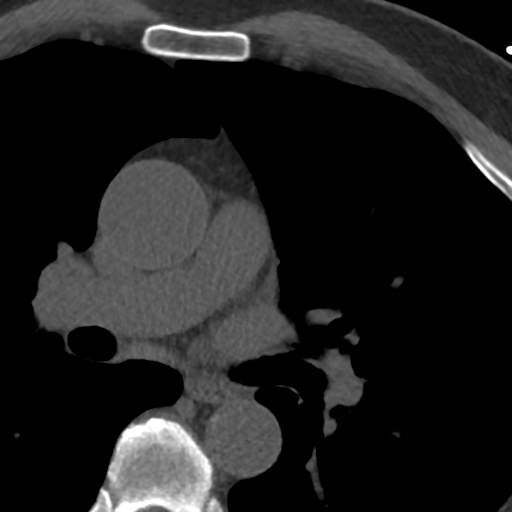

[Series 3: lung 71 % · axial · 0.72mm/px · z∈[-268,-170]mm · 5 of 51 slices shown]
[im 9/51  lung]
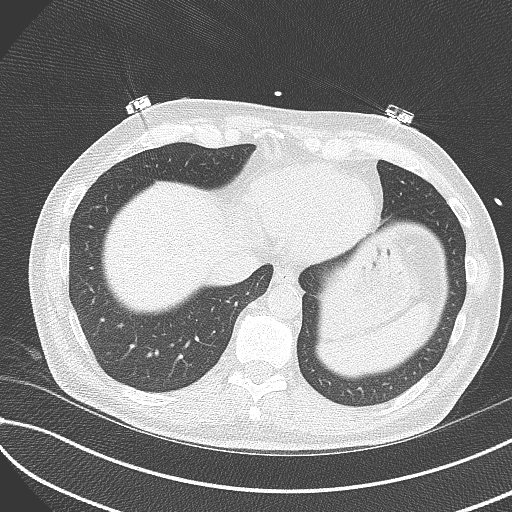
[im 17/51  lung]
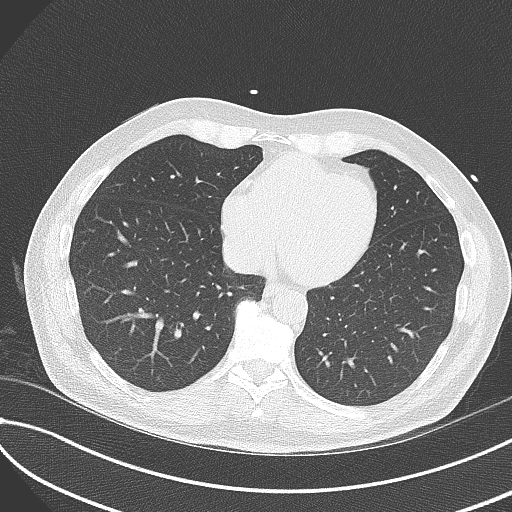
[im 26/51  lung]
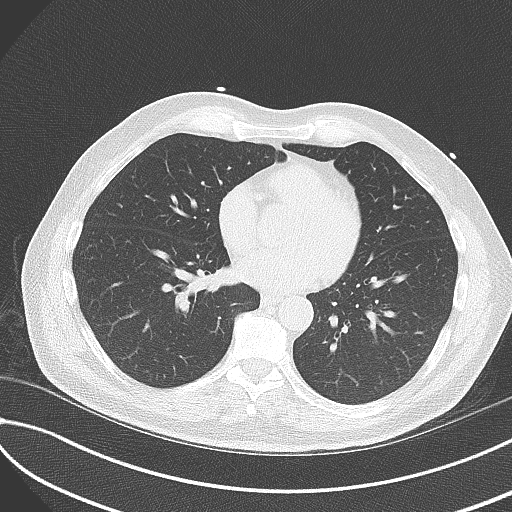
[im 34/51  lung]
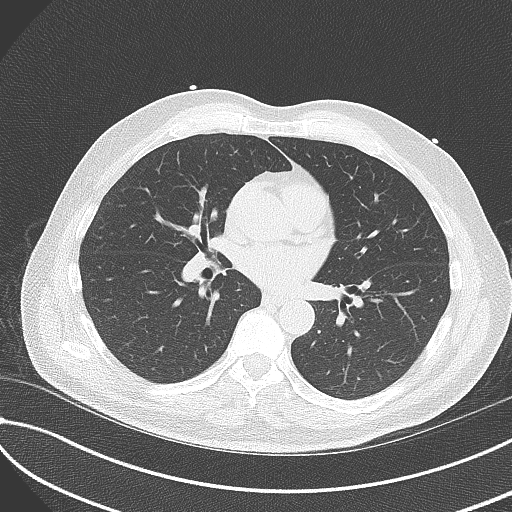
[im 42/51  lung]
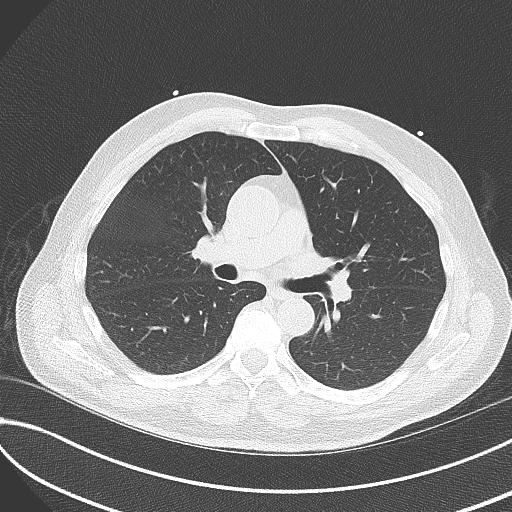

[Series 4: lung st 71 % · axial · 0.72mm/px · z∈[-268,-170]mm · 5 of 51 slices shown]
[im 9/51  lung]
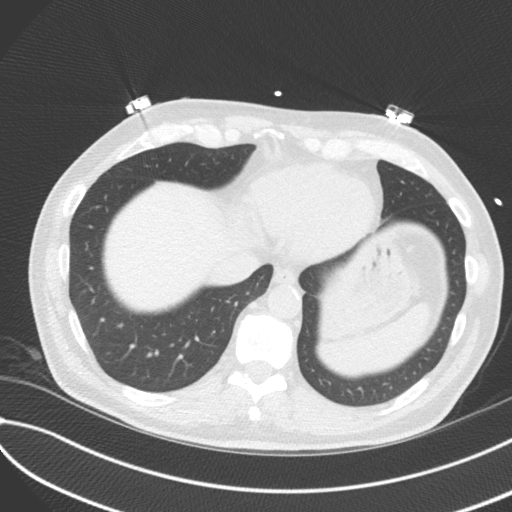
[im 17/51  lung]
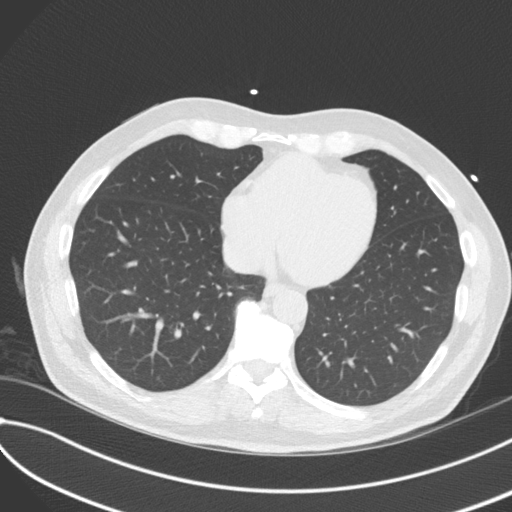
[im 26/51  lung]
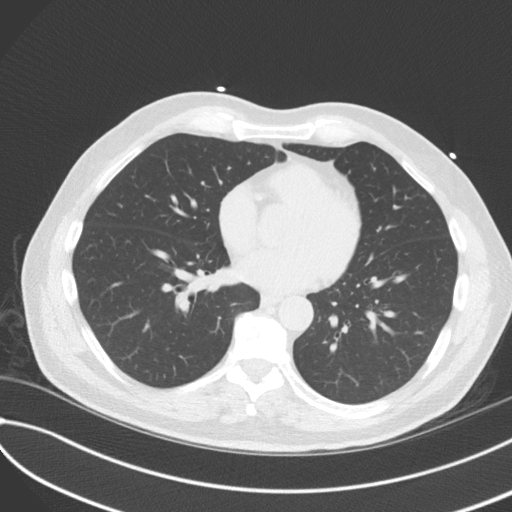
[im 34/51  lung]
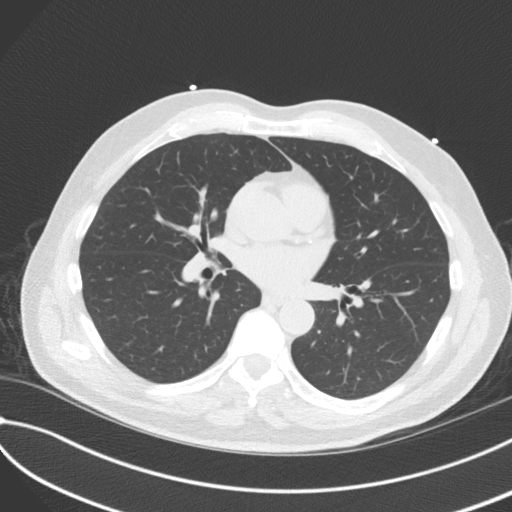
[im 42/51  lung]
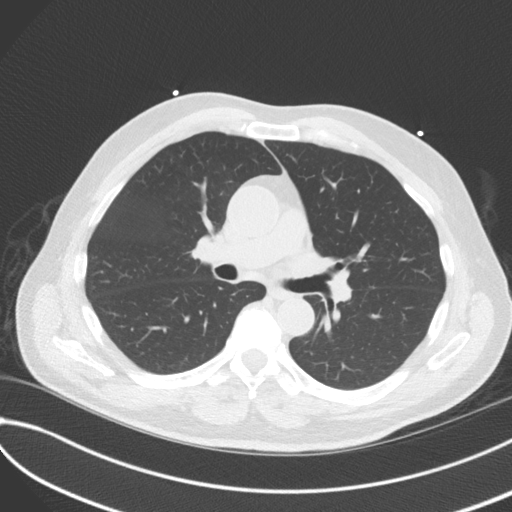

[14 of 20 positions shown; findings below may reference images not displayed]

FINDINGS: Aortic atherosclerosis. Within the visualized portions of the thorax
there are no suspicious appearing pulmonary nodules or masses, there
is no acute consolidative airspace disease, no pleural effusions, no
pneumothorax and no lymphadenopathy. Visualized portions of the
upper abdomen are unremarkable. There are no aggressive appearing
lytic or blastic lesions noted in the visualized portions of the
skeleton.
IMPRESSION: 1.  Aortic Atherosclerosis (Z02EQ-6M7.7).
FINDINGS: Non-cardiac: See separate report from [REDACTED].

Ascending Aorta: Normal size, measuring 36 mm at the mid ascending
aorta, measured double oblique at PA bifurcation. Aortic
atherosclerosis.

Pericardium: Normal

Coronary arteries: Arise from normal coronary cusps.
IMPRESSION: Coronary calcium score of 225. This was 39th percentile for age and
sex matched control.

Taisha Asia

*** End of Addendum ***
EXAM:
OVER-READ INTERPRETATION  CT CHEST

The following report is an over-read performed by radiologist Dr.
Kimo Hinostroza [REDACTED] on 05/01/2020. This
over-read does not include interpretation of cardiac or coronary
anatomy or pathology. The coronary calcium score interpretation by
the cardiologist is attached.
FINDINGS: Aortic atherosclerosis. Within the visualized portions of the thorax
there are no suspicious appearing pulmonary nodules or masses, there
is no acute consolidative airspace disease, no pleural effusions, no
pneumothorax and no lymphadenopathy. Visualized portions of the
upper abdomen are unremarkable. There are no aggressive appearing
lytic or blastic lesions noted in the visualized portions of the
skeleton.
IMPRESSION: 1.  Aortic Atherosclerosis (Z02EQ-6M7.7).

## 2020-11-19 DIAGNOSIS — Z6826 Body mass index (BMI) 26.0-26.9, adult: Secondary | ICD-10-CM | POA: Diagnosis not present

## 2020-11-19 DIAGNOSIS — L4050 Arthropathic psoriasis, unspecified: Secondary | ICD-10-CM | POA: Diagnosis not present

## 2020-11-19 DIAGNOSIS — E663 Overweight: Secondary | ICD-10-CM | POA: Diagnosis not present

## 2021-01-27 DIAGNOSIS — D225 Melanocytic nevi of trunk: Secondary | ICD-10-CM | POA: Diagnosis not present

## 2021-01-27 DIAGNOSIS — X32XXXD Exposure to sunlight, subsequent encounter: Secondary | ICD-10-CM | POA: Diagnosis not present

## 2021-01-27 DIAGNOSIS — L570 Actinic keratosis: Secondary | ICD-10-CM | POA: Diagnosis not present

## 2021-01-27 DIAGNOSIS — L821 Other seborrheic keratosis: Secondary | ICD-10-CM | POA: Diagnosis not present

## 2021-02-18 DIAGNOSIS — L4050 Arthropathic psoriasis, unspecified: Secondary | ICD-10-CM | POA: Diagnosis not present

## 2021-04-10 ENCOUNTER — Other Ambulatory Visit: Payer: Self-pay

## 2021-04-10 ENCOUNTER — Ambulatory Visit (INDEPENDENT_AMBULATORY_CARE_PROVIDER_SITE_OTHER): Payer: Medicare HMO | Admitting: Internal Medicine

## 2021-04-10 ENCOUNTER — Encounter: Payer: Self-pay | Admitting: Internal Medicine

## 2021-04-10 VITALS — BP 120/80 | HR 69 | Ht 67.5 in | Wt 167.2 lb

## 2021-04-10 DIAGNOSIS — R739 Hyperglycemia, unspecified: Secondary | ICD-10-CM | POA: Diagnosis not present

## 2021-04-10 DIAGNOSIS — R1012 Left upper quadrant pain: Secondary | ICD-10-CM | POA: Diagnosis not present

## 2021-04-10 DIAGNOSIS — I700 Atherosclerosis of aorta: Secondary | ICD-10-CM | POA: Diagnosis not present

## 2021-04-10 DIAGNOSIS — E559 Vitamin D deficiency, unspecified: Secondary | ICD-10-CM

## 2021-04-10 DIAGNOSIS — Z0001 Encounter for general adult medical examination with abnormal findings: Secondary | ICD-10-CM | POA: Diagnosis not present

## 2021-04-10 DIAGNOSIS — Z23 Encounter for immunization: Secondary | ICD-10-CM | POA: Diagnosis not present

## 2021-04-10 DIAGNOSIS — E538 Deficiency of other specified B group vitamins: Secondary | ICD-10-CM

## 2021-04-10 DIAGNOSIS — R030 Elevated blood-pressure reading, without diagnosis of hypertension: Secondary | ICD-10-CM | POA: Diagnosis not present

## 2021-04-10 DIAGNOSIS — R1013 Epigastric pain: Secondary | ICD-10-CM

## 2021-04-10 DIAGNOSIS — N289 Disorder of kidney and ureter, unspecified: Secondary | ICD-10-CM

## 2021-04-10 DIAGNOSIS — E7800 Pure hypercholesterolemia, unspecified: Secondary | ICD-10-CM | POA: Diagnosis not present

## 2021-04-10 LAB — LIPID PANEL
Cholesterol: 151 mg/dL (ref 0–200)
HDL: 48.7 mg/dL (ref >39.00)
LDL Cholesterol: 82 mg/dL (ref 0–99)
NonHDL: 102.59
Total CHOL/HDL Ratio: 3
Triglycerides: 101 mg/dL (ref 0.0–149.0)
VLDL: 20.2 mg/dL (ref 0.0–40.0)

## 2021-04-10 LAB — HEPATIC FUNCTION PANEL
ALT: 17 U/L (ref 0–53)
AST: 20 U/L (ref 0–37)
Albumin: 4.2 g/dL (ref 3.5–5.2)
Alkaline Phosphatase: 60 U/L (ref 39–117)
Bilirubin, Direct: 0.1 mg/dL (ref 0.0–0.3)
Total Bilirubin: 0.7 mg/dL (ref 0.2–1.2)
Total Protein: 6.5 g/dL (ref 6.0–8.3)

## 2021-04-10 LAB — BASIC METABOLIC PANEL
BUN: 23 mg/dL (ref 6–23)
CO2: 32 mEq/L (ref 19–32)
Calcium: 9.6 mg/dL (ref 8.4–10.5)
Chloride: 103 mEq/L (ref 96–112)
Creatinine, Ser: 1.09 mg/dL (ref 0.40–1.50)
GFR: 66.66 mL/min (ref >60.00)
Glucose, Bld: 88 mg/dL (ref 70–99)
Potassium: 4.4 mEq/L (ref 3.5–5.1)
Sodium: 141 mEq/L (ref 135–145)

## 2021-04-10 LAB — PSA: PSA: 1.27 ng/mL (ref 0.10–4.00)

## 2021-04-10 LAB — URINALYSIS, ROUTINE W REFLEX MICROSCOPIC
Bilirubin Urine: NEGATIVE
Hgb urine dipstick: NEGATIVE
Ketones, ur: NEGATIVE
Leukocytes,Ua: NEGATIVE
Nitrite: NEGATIVE
Specific Gravity, Urine: 1.015 (ref 1.000–1.030)
Total Protein, Urine: NEGATIVE
Urine Glucose: NEGATIVE
Urobilinogen, UA: 0.2 (ref 0.0–1.0)
pH: 6.5 (ref 5.0–8.0)

## 2021-04-10 LAB — CBC WITH DIFFERENTIAL/PLATELET
Basophils Absolute: 0 10*3/uL (ref 0.0–0.1)
Basophils Relative: 0.6 % (ref 0.0–3.0)
Eosinophils Absolute: 0.1 10*3/uL (ref 0.0–0.7)
Eosinophils Relative: 1.8 % (ref 0.0–5.0)
HCT: 49.6 % (ref 39.0–52.0)
Hemoglobin: 16.5 g/dL (ref 13.0–17.0)
Lymphocytes Relative: 27 % (ref 12.0–46.0)
Lymphs Abs: 2.1 10*3/uL (ref 0.7–4.0)
MCHC: 33.3 g/dL (ref 30.0–36.0)
MCV: 98.3 fl (ref 78.0–100.0)
Monocytes Absolute: 0.7 10*3/uL (ref 0.1–1.0)
Monocytes Relative: 8.9 % (ref 3.0–12.0)
Neutro Abs: 4.9 10*3/uL (ref 1.4–7.7)
Neutrophils Relative %: 61.7 % (ref 43.0–77.0)
Platelets: 216 10*3/uL (ref 150.0–400.0)
RBC: 5.05 Mil/uL (ref 4.22–5.81)
RDW: 13.1 % (ref 11.5–15.5)
WBC: 7.9 10*3/uL (ref 4.0–10.5)

## 2021-04-10 LAB — TSH: TSH: 1.04 u[IU]/mL (ref 0.35–5.50)

## 2021-04-10 LAB — LIPASE: Lipase: 25 U/L (ref 11.0–59.0)

## 2021-04-10 LAB — VITAMIN B12: Vitamin B-12: 360 pg/mL (ref 211–911)

## 2021-04-10 LAB — VITAMIN D 25 HYDROXY (VIT D DEFICIENCY, FRACTURES): VITD: 53.07 ng/mL (ref 30.00–100.00)

## 2021-04-10 NOTE — Progress Notes (Signed)
Patient ID: Jason Hart, male   DOB: [birth year], 75 y.o.   MRN: [id number]         Chief Complaint:: wellness exam and left abd pain x 1 mo       HPI:  Jason Hart is a 75 y.o. male here for wellness exam; declines shingrx, o/w up to date with preventive referrals and immunizations                        Also c/o somewhat vague upper epigastric/LUQ sensation - pain, dull pressures like x 1 mo, no radiation, Denies worsening reflux, other abd pain, dysphagia, n/v, bowel change or blood.  Nothing seems to make better or worse.  He has such a long hx of good health that this is unusual and very concerning to him.  Pt denies chest pain, increased sob or doe, wheezing, orthopnea, PND, increased LE swelling, palpitations, dizziness or syncope.   Pt denies polydipsia, polyuria, or new focal neuro s/s.   Pt denies fever, wt loss, night sweats, loss of appetite, or other constitutional symptoms  No other new complaints   Wt Readings from Last 3 Encounters:  04/10/21 167 lb 3.2 oz (75.8 kg)  06/28/20 167 lb 6.4 oz (75.9 kg)  03/27/20 170 lb (77.1 kg)   BP Readings from Last 3 Encounters:  04/10/21 120/80  09/04/20 108/63  06/28/20 122/72   Immunization History  Administered Date(s) Administered   Fluad Quad(high Dose 65+) 05/08/2019, 04/10/2021   Influenza, High Dose Seasonal PF 05/28/2016   PFIZER(Purple Top)SARS-COV-2 Vaccination 09/01/2019, 09/19/2019, 04/23/2020, 08/23/2020   Pneumococcal Conjugate-13 02/24/2016   Pneumococcal Polysaccharide-23 03/16/2017   Tetanus 02/14/2013   Zoster, Live 09/09/2015   There are no preventive care reminders to display for this patient.     Past Medical History:  Diagnosis Date   Arthritis    BPH (benign prostatic hypertrophy)    Conductive hearing loss    Hyperlipidemia    Psoriatic arthritis ([place])    Trigger point with temporomandibular joint pain    Past Surgical History:  Procedure Laterality Date   COLONOSCOPY  2006    Negative;due 2016; [facility]   TONSILLECTOMY      reports that he has never smoked. He has never used smokeless tobacco. He reports that he does not drink alcohol and does not use drugs. family history includes Benign prostatic hyperplasia in his brother; CAD in his brother and brother; Coronary artery disease (age of onset: 41) in his brother; Heart attack (age of onset: 68) in his father; Heart attack (age of onset: 69) in his paternal grandfather; Hyperlipidemia in his father; Prostate cancer in his brother; Sudden death in his father; Throat cancer in his mother. No Known Allergies Current Outpatient Medications on File Prior to Visit  Medication Sig Dispense Refill   aspirin EC 81 MG tablet Take 1 tablet (81 mg total) by mouth daily. Swallow whole. 90 tablet 3   folic acid (FOLVITE) 1 MG tablet Take 1 tablet (1 mg total) by mouth daily. 90 tablet 3   methotrexate (RHEUMATREX) 2.5 MG tablet Take 2.5 mg by mouth once a week. Caution:Chemotherapy. Protect from light.  4 tablets     pravastatin (PRAVACHOL) 80 MG tablet Take 1 tablet (80 mg total) by mouth every evening. 90 tablet 3   No current facility-administered medications on file prior to visit.        ROS:  All others reviewed and negative.  Objective  PE:  BP 120/80 (BP Location: Right Arm, Patient Position: Sitting, Cuff Size: Normal)   Pulse 69   Ht 5' 7.5" (1.715 m)   Wt 167 lb 3.2 oz (75.8 kg)   SpO2 96%   BMI 25.80 kg/m                 Constitutional: Pt appears in NAD               HENT: Head: NCAT.                Right Ear: External ear normal.                 Left Ear: External ear normal.                Eyes: . Pupils are equal, round, and reactive to light. Conjunctivae and EOM are normal               Nose: without d/c or deformity               Neck: Neck supple. Gross normal ROM               Cardiovascular: Normal rate and regular rhythm.                 Pulmonary/Chest: Effort normal and breath  sounds without rales or wheezing.                Abd:  Soft, NT, ND, + BS, no organomegaly               Neurological: Pt is alert. At baseline orientation, motor grossly intact               Skin: Skin is warm. No rashes, no other new lesions, LE edema - none               Psychiatric: Pt behavior is normal without agitation   Micro: none  Cardiac tracings I have personally interpreted today:  none  Pertinent Radiological findings (summarize): none   Lab Results  Component Value Date   WBC 7.9 04/10/2021   HGB 16.5 04/10/2021   HCT 49.6 04/10/2021   PLT 216.0 04/10/2021   GLUCOSE 88 04/10/2021   CHOL 151 04/10/2021   TRIG 101.0 04/10/2021   HDL 48.70 04/10/2021   LDLDIRECT 137.3 10/25/2009   LDLCALC 82 04/10/2021   ALT 17 04/10/2021   AST 20 04/10/2021   NA 141 04/10/2021   K 4.4 04/10/2021   CL 103 04/10/2021   CREATININE 1.09 04/10/2021   BUN 23 04/10/2021   CO2 32 04/10/2021   TSH 1.04 04/10/2021   PSA 1.27 04/10/2021   INR 1.2 02/23/2020   HGBA1C 5.7 (H) 03/21/2020   Assessment/Plan:  Jason Hart is a 75 y.o. White or Caucasian [1] male with  has a past medical history of Arthritis, BPH (benign prostatic hypertrophy), Conductive hearing loss, Hyperlipidemia, Psoriatic arthritis ([place]), and Trigger point with temporomandibular joint pain.  Encounter for well adult exam with abnormal findings Age and sex appropriate education and counseling updated with regular exercise and diet Referrals for preventative services - none needed Immunizations addressed - declines shingrix Smoking counseling  - none needed Evidence for depression or other mood disorder - none significant Most recent labs reviewed. I have personally reviewed and have noted: 1) the patient's medical and social history 2) The patient's current medications and supplements 3) The patient's height, weight, and BMI have  been recorded in the chart   Epigastric pain Mild but highly unusual for  him, for labs today including cbc, lfts but also CT abd/pelvis,  to f/u any worsening symptoms or concerns  Aortic atherosclerosis ([place]) Pt to continue low chol diet, exercise, pravachol 80  Hyperlipidemia Lab Results  Component Value Date   LDLCALC 82 04/10/2021   Mild uncontrolled, goal ldl < 70,, pt to continue current statin pravacho 80 - declines change, for lower chol diet as well   Mild renal insufficiency Lab Results  Component Value Date   CREATININE 1.09 04/10/2021   Stable overall, cont to avoid nephrotoxins   Hyperglycemia Lab Results  Component Value Date   HGBA1C 5.7 (H) 03/21/2020   Stable, pt to continue current medical treatment  -diet   Elevated blood pressure reading without diagnosis of hypertension BP Readings from Last 3 Encounters:  04/10/21 120/80  09/04/20 108/63  06/28/20 122/72   Stable, pt to continue medical treatment  - low salt diet,wt control  Followup: Return in about 1 year (around 04/10/2022).  Cathlean Cower, MD 04/14/2021 2:06 PM [facility]

## 2021-04-10 NOTE — Patient Instructions (Addendum)
Please check the insurance about the Shingrix shots  You had the flu shot today  Please continue all other medications as before, and refills have been done if requested.  Please have the pharmacy call with any other refills you may need.  Please continue your efforts at being more active, low cholesterol diet, and weight control.  You are otherwise up to date with prevention measures today.  Please keep your appointments with your specialists as you may have planned - rheumatology, and cardiology  You will be contacted regarding the referral for: CT scan  Please go to the LAB at the blood drawing area for the tests to be done  You will be contacted by phone if any changes need to be made immediately.  Otherwise, you will receive a letter about your results with an explanation, but please check with MyChart first.  Please remember to sign up for MyChart if you have not done so, as this will be important to you in the future with finding out test results, communicating by private email, and scheduling acute appointments online when needed.  Please make an Appointment to return for your 1 year visit, or sooner if needed

## 2021-04-14 ENCOUNTER — Encounter: Payer: Self-pay | Admitting: Internal Medicine

## 2021-04-14 NOTE — Assessment & Plan Note (Signed)
Lab Results  Component Value Date   HGBA1C 5.7 (H) 03/21/2020   Stable, pt to continue current medical treatment  -diet

## 2021-04-14 NOTE — Assessment & Plan Note (Signed)
BP Readings from Last 3 Encounters:  04/10/21 120/80  09/04/20 108/63  06/28/20 122/72   Stable, pt to continue medical treatment  - low salt diet,wt control

## 2021-04-14 NOTE — Assessment & Plan Note (Signed)
Pt to continue low chol diet, exercise, pravachol 80

## 2021-04-14 NOTE — Assessment & Plan Note (Signed)
Lab Results  Component Value Date   LDLCALC 82 04/10/2021   Mild uncontrolled, goal ldl < 70,, pt to continue current statin pravacho 80 - declines change, for lower chol diet as well

## 2021-04-14 NOTE — Assessment & Plan Note (Signed)

## 2021-04-14 NOTE — Assessment & Plan Note (Signed)
Lab Results  Component Value Date   CREATININE 1.09 04/10/2021   Stable overall, cont to avoid nephrotoxins

## 2021-04-14 NOTE — Assessment & Plan Note (Signed)
Mild but highly unusual for him, for labs today including cbc, lfts but also CT abd/pelvis,  to f/u any worsening symptoms or concerns

## 2021-04-21 ENCOUNTER — Telehealth: Payer: Self-pay | Admitting: Internal Medicine

## 2021-04-21 DIAGNOSIS — R1013 Epigastric pain: Secondary | ICD-10-CM

## 2021-04-21 NOTE — Telephone Encounter (Signed)
Ok to let pt know  Insurance would not authorize CT scan, so had to order an ultrasound instead,   We still might need a CT scan but only if something with the ultrasound is not normal

## 2021-04-21 NOTE — Telephone Encounter (Signed)
Dr Jenny Reichmann,   Insurance response to request for CT authorization:  Before approval they are requesting ultrasound   Results of a prior ultrasound (picture study using sound waves) must have failed to find  the source of your problem.    Please provide the requested clinical information Before 04/26/2021. Please call [phone],  select the prompt associated with the request type and enter in reference number [id number] to  speak with a clinician about this request. You may fax additional clinical information to support  this request to [phone] for review

## 2021-04-22 DIAGNOSIS — H5213 Myopia, bilateral: Secondary | ICD-10-CM | POA: Diagnosis not present

## 2021-04-22 DIAGNOSIS — H2513 Age-related nuclear cataract, bilateral: Secondary | ICD-10-CM | POA: Diagnosis not present

## 2021-04-22 DIAGNOSIS — H353131 Nonexudative age-related macular degeneration, bilateral, early dry stage: Secondary | ICD-10-CM | POA: Diagnosis not present

## 2021-05-06 ENCOUNTER — Other Ambulatory Visit: Payer: Medicare HMO

## 2021-05-13 ENCOUNTER — Encounter: Payer: Self-pay | Admitting: Internal Medicine

## 2021-05-13 ENCOUNTER — Ambulatory Visit
Admission: RE | Admit: 2021-05-13 | Discharge: 2021-05-13 | Disposition: A | Discharge status: Home / Self Care | Payer: Medicare HMO | Source: Ambulatory Visit | Attending: Internal Medicine | Admitting: Internal Medicine

## 2021-05-13 DIAGNOSIS — R1013 Epigastric pain: Secondary | ICD-10-CM | POA: Diagnosis not present

## 2021-05-21 DIAGNOSIS — L4050 Arthropathic psoriasis, unspecified: Secondary | ICD-10-CM | POA: Diagnosis not present

## 2021-05-21 DIAGNOSIS — Z6826 Body mass index (BMI) 26.0-26.9, adult: Secondary | ICD-10-CM | POA: Diagnosis not present

## 2021-05-21 DIAGNOSIS — Z79899 Other long term (current) drug therapy: Secondary | ICD-10-CM | POA: Diagnosis not present

## 2021-05-21 DIAGNOSIS — E663 Overweight: Secondary | ICD-10-CM | POA: Diagnosis not present

## 2021-06-24 ENCOUNTER — Encounter: Payer: Self-pay | Admitting: Cardiology

## 2021-06-24 ENCOUNTER — Ambulatory Visit: Payer: Medicare HMO | Admitting: Cardiology

## 2021-06-24 ENCOUNTER — Other Ambulatory Visit: Payer: Self-pay

## 2021-06-24 VITALS — BP 120/80 | HR 68 | Ht 67.5 in | Wt 165.8 lb

## 2021-06-24 DIAGNOSIS — E7800 Pure hypercholesterolemia, unspecified: Secondary | ICD-10-CM | POA: Diagnosis not present

## 2021-06-24 DIAGNOSIS — I2510 Atherosclerotic heart disease of native coronary artery without angina pectoris: Secondary | ICD-10-CM

## 2021-06-24 DIAGNOSIS — I2583 Coronary atherosclerosis due to lipid rich plaque: Secondary | ICD-10-CM | POA: Diagnosis not present

## 2021-06-24 DIAGNOSIS — I700 Atherosclerosis of aorta: Secondary | ICD-10-CM | POA: Diagnosis not present

## 2021-06-24 MED ORDER — EZETIMIBE 10 MG PO TABS: 10.0000 mg | ORAL_TABLET | Freq: Every day | ORAL | 3 refills | Status: DC

## 2021-06-24 NOTE — Addendum Note (Signed)
Addended by: Antonieta Iba on: 06/24/2021 09:47 AM   Modules accepted: Orders

## 2021-06-24 NOTE — Patient Instructions (Signed)
Medication Instructions:  Your physician has recommended you make the following change in your medication: 1) START taking Zetia (ezetimibe) 10 mg daily   *If you need a refill on your cardiac medications before your next appointment, please call your pharmacy*   Lab Work: Come back in 8 weeks for fasting lab work If you have labs (blood work) drawn today and your tests are completely normal, you will receive your results only by: [employer] (if you have MyChart) OR A paper copy in the mail If you have any lab test that is abnormal or we need to change your treatment, we will call you to review the results.  Follow-Up: At [facility], you and your health needs are our priority.  As part of our continuing mission to provide you with exceptional heart care, we have created designated Provider Care Teams.  These Care Teams include your primary Cardiologist (physician) and Advanced Practice Providers (APPs -  Physician Assistants and Nurse Practitioners) who all work together to provide you with the care you need, when you need it.  Your next appointment:   1 year(s)  The format for your next appointment:   In Person  Provider:   Fransico Him, MD

## 2021-06-24 NOTE — Progress Notes (Signed)
Cardiology Office Note    Date:  06/24/2021   ID:  ARMOUR VILLANUEVA, DOB January 08, 1946, MRN [id number]  PCP:  Biagio Borg, MD  Cardiologist:  Fransico Him, MD   Chief Complaint  Patient presents with   Coronary Artery Disease   Hyperlipidemia     History of Present Illness:  Jason Hart is a 75 y.o. male  with a hx of HLD, psoriatic arthritis and BPH who underwent screening coronary CA scoring to assess cardiac risk.  His calcium score was elevated at 225 but only 39th % for age and sex matched controls.  He also has aortic atherosclerosis.  Coronary CTA showed coronary calcium score 316 with 25 to 49% stenosis throughout the RCA, small patent ramus, 50 to 69% mid LAD proximal to the takeoff of D2 followed by 25 to 49% stenosis on 09/04/2020 with FFR showing no focal stenosis.  He has never smoked but has a strong exposure hx of second hand smoke from both parents growing up.  He also has a fm hx of premature CAD.  His father died of an SCD/MI at age 106 and his brother had an MI at age 26 and paternal GF had an MI at 31.    He is here today for followup and is doing well.  He denies any chest pain or pressure, SOB, DOE, PND, orthopnea, LE edema, dizziness, palpitations or syncope. He is compliant with his meds and is tolerating meds with no SE.     Past Medical History:  Diagnosis Date   Arthritis    BPH (benign prostatic hypertrophy)    Conductive hearing loss    Hyperlipidemia    Psoriatic arthritis ([place])    Trigger point with temporomandibular joint pain     Past Surgical History:  Procedure Laterality Date   COLONOSCOPY  2006   Negative;due 2016; [facility]   TONSILLECTOMY      Current Medications: Current Meds  Medication Sig   aspirin EC 81 MG tablet Take 1 tablet (81 mg total) by mouth daily. Swallow whole.   folic acid (FOLVITE) 1 MG tablet Take 1 tablet (1 mg total) by mouth daily.   methotrexate (RHEUMATREX) 2.5 MG tablet Take 2.5 mg by mouth once a  week. Caution:Chemotherapy. Protect from light.  4 tablets   pravastatin (PRAVACHOL) 80 MG tablet Take 1 tablet (80 mg total) by mouth every evening.    Allergies:   Patient has no known allergies.   Social History   Socioeconomic History   Marital status: Widowed    Spouse name: Not on file   Number of children: Not on file   Years of education: Not on file   Highest education level: Not on file  Occupational History   Occupation: Scientist, clinical (histocompatibility and immunogenetics): [employer]  Tobacco Use   Smoking status: Never   Smokeless tobacco: Never  Vaping Use   Vaping Use: Never used  Substance and Sexual Activity   Alcohol use: No    Alcohol/week: 0.0 standard drinks    Comment: not since MTX started; occasional   Drug use: No   Sexual activity: Not on file  Other Topics Concern   Not on file  Social History Narrative   Fun/Hobby: Air cabin crew    Social Determinants of Health   Financial Resource Strain: Not on file  Food Insecurity: Not on file  Transportation Needs: Not on file  Physical Activity: Not on file  Stress: Not on file  Social  Connections: Not on file     Family History:  The patient's family history includes Benign prostatic hyperplasia in his brother; CAD in his brother and brother; Coronary artery disease (age of onset: 73) in his brother; Heart attack (age of onset: 98) in his father; Heart attack (age of onset: 49) in his paternal grandfather; Hyperlipidemia in his father; Prostate cancer in his brother; Sudden death in his father; Throat cancer in his mother.   ROS:   Please see the history of present illness.    ROS All other systems reviewed and are negative.  PAD Screen 06/28/2020  Previous PAD dx? No  Previous surgical procedure? No  Pain with walking? No  Feet/toe relief with dangling? No  Painful, non-healing ulcers? No  Extremities discolored? No       PHYSICAL EXAM:   VS:  Ht 5' 7.5" (1.715 m)   Wt 165 lb 12.8 oz (75.2 kg)   BMI 25.58 kg/m     GEN: Well nourished, well developed in no acute distress HEENT: Normal NECK: No JVD; No carotid bruits LYMPHATICS: No lymphadenopathy CARDIAC:RRR, no murmurs, rubs, gallops RESPIRATORY:  Clear to auscultation without rales, wheezing or rhonchi  ABDOMEN: Soft, non-tender, non-distended MUSCULOSKELETAL:  No edema; No deformity  SKIN: Warm and dry NEUROLOGIC:  Alert and oriented x 3 PSYCHIATRIC:  Normal affect   Wt Readings from Last 3 Encounters:  06/24/21 165 lb 12.8 oz (75.2 kg)  04/10/21 167 lb 3.2 oz (75.8 kg)  06/28/20 167 lb 6.4 oz (75.9 kg)      Studies/Labs Reviewed:   EKG:  EKG is not ordered today.  The ekg ordered today demonstrates NSR with no ST changes  Recent Labs: 04/10/2021: ALT 17; BUN 23; Creatinine, Ser 1.09; Hemoglobin 16.5; Platelets 216.0; Potassium 4.4; Sodium 141; TSH 1.04   Lipid Panel    Component Value Date/Time   CHOL 151 04/10/2021 1005   CHOL 142 10/08/2020 1029   CHOL 150 02/13/2015 0923   TRIG 101.0 04/10/2021 1005   TRIG 100 02/13/2015 0923   HDL 48.70 04/10/2021 1005   HDL 53 10/08/2020 1029   HDL 46 02/13/2015 0923   CHOLHDL 3 04/10/2021 1005   VLDL 20.2 04/10/2021 1005   LDLCALC 82 04/10/2021 1005   LDLCALC 73 10/08/2020 1029   LDLCALC 98 03/21/2020 0820   LDLCALC 84 02/13/2015 0923   LDLDIRECT 137.3 10/25/2009 0000      Additional studies/ records that were reviewed today include:  Office notes from PCP, EKG    ASSESSMENT:    1. Coronary artery disease due to lipid rich plaque   2. Aortic atherosclerosis (HCC)   3. Pure hypercholesterolemia      PLAN:  In order of problems listed above:  1.  ASCAD -calcium score was elevated at 225 but only 39th % for age and sex matched controls. -Coronary CTA showed coronary calcium score 316 with 25 to 49% stenosis throughout the RCA, small patent ramus, 50 to 69% mid LAD proximal to the takeoff of D2 followed by 25 to 49% stenosis on 09/04/2020 with FFR showing no focal  stenosis. -He has a strong fm hx of premature CAD with his father dying of an MI at 51 and his brother having an MI at 18 and GF at 20.  -his CRFs include premature fm hs, extensive second hand smoke exposure as a child and HLD -He is asymptomatic from a cardiac standpoint -continue  aggressive risk factor modification -Continue prescription drug management with aspirin 81  mg daily and pravastatin 80 mg daily -continue in daily exercise routine>>no sx with exercise  2.  Aortic atherosclerosis -noted on Chest CT -continue statin -BP is controlled  2.  HLD -LDL goal is less than 70  -I have personally reviewed and interpreted outside labs performed by patient's PCP which showed LDL 82, HDL 48, triglycerides 101, ALT 17 on 04/10/2021 -Continue prescription drug management with pravastatin 80 mg daily and add Zetia 10 mg daily -Repeat FLP and ALT in 6 weeks   Medication Adjustments/Labs and Tests Ordered: Current medicines are reviewed at length with the patient today.  Concerns regarding medicines are outlined above.  Medication changes, Labs and Tests ordered today are listed in the Patient Instructions below.  There are no Patient Instructions on file for this visit.   Signed, Fransico Him, MD  06/24/2021 9:28 AM    [address], [place], [place]  [address] Phone: [phone]; Fax: [phone]

## 2021-07-17 ENCOUNTER — Telehealth: Payer: Self-pay | Admitting: Internal Medicine

## 2021-07-17 NOTE — Telephone Encounter (Signed)
Patient's girlfriend called on his behalf to inform pt tested positive for Covid today. Sx started a couple days; sore throat, persistent headache, slight congestion, and fatigue. Pt stated that due to mild sx he did not think a virtual visit was needed at this time. Encouraged if sx worsen to contact office to schedule virtual visit.

## 2021-07-23 ENCOUNTER — Telehealth: Payer: Medicare HMO | Admitting: Physician Assistant

## 2021-07-23 DIAGNOSIS — Z8616 Personal history of COVID-19: Secondary | ICD-10-CM

## 2021-07-23 NOTE — Patient Instructions (Signed)
°  Teryl Lucy, thank you for joining Leeanne Rio, PA-C for today's virtual visit.  While this provider is not your primary care provider (PCP), if your PCP is located in our provider database this encounter information will be shared with them immediately following your visit.  Consent: (Patient) Oneal Schoenberger Essman provided verbal consent for this virtual visit at the beginning of the encounter.  Current Medications:  Current Outpatient Medications:    aspirin EC 81 MG tablet, Take 1 tablet (81 mg total) by mouth daily. Swallow whole., Disp: 90 tablet, Rfl: 3   ezetimibe (ZETIA) 10 MG tablet, Take 1 tablet (10 mg total) by mouth daily., Disp: 90 tablet, Rfl: 3   folic acid (FOLVITE) 1 MG tablet, Take 1 tablet (1 mg total) by mouth daily., Disp: 90 tablet, Rfl: 3   methotrexate (RHEUMATREX) 2.5 MG tablet, Take 2.5 mg by mouth once a week. Caution:Chemotherapy. Protect from light.  4 tablets, Disp: , Rfl:    pravastatin (PRAVACHOL) 80 MG tablet, Take 1 tablet (80 mg total) by mouth every evening., Disp: 90 tablet, Rfl: 3   Medications ordered in this encounter:  No orders of the defined types were placed in this encounter.    *If you need refills on other medications prior to your next appointment, please contact your pharmacy*  Follow-Up: Call back or seek an in-person evaluation if the symptoms worsen or if the condition fails to improve as anticipated.  Other Instructions Please continue to stay hydrated and rest. You can use saline nasal rinses or plain mucinex for any lingering nasal or chest congestion, respectively.  Symptoms should only continue to resolve. No need to retest unless recurrence of symptoms.   You can end quarantine after today but need to mask for another 5 days if leaving the house.    If you have been instructed to have an in-person evaluation today at a local Urgent Care facility, please use the link below. It will take you to a list of all of  our available [facility], including address, phone number and hours of operation. Please do not delay care.  [facility]  If you or a family member do not have a primary care provider, use the link below to schedule a visit and establish care. When you choose a [facility] primary care physician or advanced practice provider, you gain a long-term partner in health. Find a Primary Care Provider  Learn more about [facility]'s in-office and virtual care options: [facility]

## 2021-07-23 NOTE — Progress Notes (Signed)
Virtual Visit Consent   HADES MATHEW, you are scheduled for a virtual visit with a [facility] provider today.     Just as with appointments in the office, your consent must be obtained to participate.  Your consent will be active for this visit and any virtual visit you may have with one of our providers in the next 365 days.     If you have a MyChart account, a copy of this consent can be sent to you electronically.  All virtual visits are billed to your insurance company just like a traditional visit in the office.    As this is a virtual visit, video technology does not allow for your provider to perform a traditional examination.  This may limit your provider's ability to fully assess your condition.  If your provider identifies any concerns that need to be evaluated in person or the need to arrange testing (such as labs, EKG, etc.), we will make arrangements to do so.     Although advances in technology are sophisticated, we cannot ensure that it will always work on either your end or our end.  If the connection with a video visit is poor, the visit may have to be switched to a telephone visit.  With either a video or telephone visit, we are not always able to ensure that we have a secure connection.     I need to obtain your verbal consent now.   Are you willing to proceed with your visit today?    Jason Hart has provided verbal consent on 07/23/2021 for a virtual visit (video or telephone).   Leeanne Rio, [place]   Date: 07/23/2021 2:23 PM   Virtual Visit via Video Note   I, Leeanne Rio, connected with  Jason Hart  ([id number], 06-30-1946) on 07/23/21 at  2:15 PM EST by a video-enabled telemedicine application and verified that I am speaking with the correct person using two identifiers.  Location: Patient: Virtual Visit Location Patient: Home Provider: Virtual Visit Location Provider: Home Office   I discussed the limitations of evaluation  and management by telemedicine and the availability of in person appointments. The patient expressed understanding and agreed to proceed.    History of Present Illness: Jason Hart is a 75 y.o. who identifies as a male who was assigned male at birth, and is being seen today for COVID-19. Patient endorses testing positive for COVID last Friday. Notes initially with head congestion, fatigue, sore throat and cough. Notes fever for about 1-2 days (low-grade). Notes symptoms have substantially improved but he still tested positive this morning so was concerned.   HPI: HPI  Problems:  Patient Active Problem List   Diagnosis Date Noted   Epigastric pain 04/10/2021   Aortic atherosclerosis ([place]) 05/02/2020   Hyperglycemia 03/05/2020   Community acquired pneumonia of right lower lobe of lung 02/23/2020   Hyponatremia 02/23/2020   Rhabdomyolysis 02/23/2020   Mild renal insufficiency 02/23/2020   Multiple falls 02/23/2020   Low back pain 04/26/2019   Encounter for well adult exam with abnormal findings 02/24/2016   Elevated blood pressure reading without diagnosis of hypertension 02/15/2014   Psoriatic arthritis ([place]) 02/14/2013   Metatarsalgia 02/12/2012   Hyperlipidemia 10/25/2009   Benign prostatic hyperplasia (BPH) with straining on urination 10/25/2009   LOSS, CONDUCTIVE HEARING NOS 03/23/2007    Allergies: No Known Allergies Medications:  Current Outpatient Medications:    aspirin EC 81 MG tablet, Take 1 tablet (81 mg  total) by mouth daily. Swallow whole., Disp: 90 tablet, Rfl: 3   ezetimibe (ZETIA) 10 MG tablet, Take 1 tablet (10 mg total) by mouth daily., Disp: 90 tablet, Rfl: 3   folic acid (FOLVITE) 1 MG tablet, Take 1 tablet (1 mg total) by mouth daily., Disp: 90 tablet, Rfl: 3   methotrexate (RHEUMATREX) 2.5 MG tablet, Take 2.5 mg by mouth once a week. Caution:Chemotherapy. Protect from light.  4 tablets, Disp: , Rfl:    pravastatin (PRAVACHOL) 80 MG tablet, Take 1 tablet  (80 mg total) by mouth every evening., Disp: 90 tablet, Rfl: 3  Observations/Objective: Patient is well-developed, well-nourished in no acute distress.  Resting comfortably at home.  Head is normocephalic, atraumatic.  No labored breathing. Speech is clear and coherent with logical content.  Patient is alert and oriented at baseline.   Assessment and Plan: 1. History of COVID-19 Mostly resolved. Was concerned about still testing positive. Reassurance given in absence of symptoms that he likely had a false-positive test which can occur for a couple of weeks after active infection. Discussed he is out of quarantine after today but needs to mask for 5 more days if leaving the house. Encouraged him to reach out to [place] or PCP if any recurring or new symptoms.   Follow Up Instructions: I discussed the assessment and treatment plan with the patient. The patient was provided an opportunity to ask questions and all were answered. The patient agreed with the plan and demonstrated an understanding of the instructions.  A copy of instructions were sent to the patient via MyChart unless otherwise noted below.   The patient was advised to call back or seek an in-person evaluation if the symptoms worsen or if the condition fails to improve as anticipated.  Time:  I spent 12 minutes with the patient via telehealth technology discussing the above problems/concerns.    Leeanne Rio, PA-C

## 2021-08-21 DIAGNOSIS — L4050 Arthropathic psoriasis, unspecified: Secondary | ICD-10-CM | POA: Diagnosis not present

## 2021-08-25 ENCOUNTER — Other Ambulatory Visit: Payer: Medicare HMO

## 2021-08-26 ENCOUNTER — Other Ambulatory Visit: Payer: Medicare HMO | Admitting: *Deleted

## 2021-08-26 ENCOUNTER — Other Ambulatory Visit: Payer: Self-pay

## 2021-08-26 DIAGNOSIS — I2510 Atherosclerotic heart disease of native coronary artery without angina pectoris: Secondary | ICD-10-CM

## 2021-08-26 DIAGNOSIS — I700 Atherosclerosis of aorta: Secondary | ICD-10-CM

## 2021-08-26 DIAGNOSIS — E7800 Pure hypercholesterolemia, unspecified: Secondary | ICD-10-CM

## 2021-08-26 DIAGNOSIS — I2583 Coronary atherosclerosis due to lipid rich plaque: Secondary | ICD-10-CM | POA: Diagnosis not present

## 2021-08-26 LAB — ALT: ALT: 16 IU/L (ref 0–44)

## 2021-08-26 LAB — LIPID PANEL
Chol/HDL Ratio: 2.7 ratio (ref 0.0–5.0)
Cholesterol, Total: 130 mg/dL (ref 100–199)
HDL: 49 mg/dL (ref >39)
LDL Chol Calc (NIH): 66 mg/dL (ref 0–99)
Triglycerides: 74 mg/dL (ref 0–149)
VLDL Cholesterol Cal: 15 mg/dL (ref 5–40)

## 2021-09-02 ENCOUNTER — Other Ambulatory Visit: Payer: Self-pay

## 2021-09-02 ENCOUNTER — Ambulatory Visit: Payer: Medicare HMO | Admitting: Internal Medicine

## 2021-09-02 DIAGNOSIS — Z1283 Encounter for screening for malignant neoplasm of skin: Secondary | ICD-10-CM | POA: Diagnosis not present

## 2021-09-02 DIAGNOSIS — X32XXXD Exposure to sunlight, subsequent encounter: Secondary | ICD-10-CM | POA: Diagnosis not present

## 2021-09-02 DIAGNOSIS — L821 Other seborrheic keratosis: Secondary | ICD-10-CM | POA: Diagnosis not present

## 2021-09-02 DIAGNOSIS — L570 Actinic keratosis: Secondary | ICD-10-CM | POA: Diagnosis not present

## 2021-11-03 ENCOUNTER — Other Ambulatory Visit: Payer: Self-pay | Admitting: Cardiology

## 2021-11-21 DIAGNOSIS — Z0100 Encounter for examination of eyes and vision without abnormal findings: Secondary | ICD-10-CM | POA: Diagnosis not present

## 2022-02-02 DIAGNOSIS — Z79899 Other long term (current) drug therapy: Secondary | ICD-10-CM | POA: Diagnosis not present

## 2022-02-02 DIAGNOSIS — L4050 Arthropathic psoriasis, unspecified: Secondary | ICD-10-CM | POA: Diagnosis not present

## 2022-02-02 DIAGNOSIS — E663 Overweight: Secondary | ICD-10-CM | POA: Diagnosis not present

## 2022-02-02 DIAGNOSIS — Z6826 Body mass index (BMI) 26.0-26.9, adult: Secondary | ICD-10-CM | POA: Diagnosis not present

## 2022-04-14 ENCOUNTER — Ambulatory Visit (INDEPENDENT_AMBULATORY_CARE_PROVIDER_SITE_OTHER): Payer: Medicare HMO | Admitting: Internal Medicine

## 2022-04-14 VITALS — BP 128/66 | HR 70 | Temp 98.3°F | Ht 67.5 in | Wt 169.0 lb

## 2022-04-14 DIAGNOSIS — Z0001 Encounter for general adult medical examination with abnormal findings: Secondary | ICD-10-CM

## 2022-04-14 DIAGNOSIS — E7800 Pure hypercholesterolemia, unspecified: Secondary | ICD-10-CM | POA: Diagnosis not present

## 2022-04-14 DIAGNOSIS — E559 Vitamin D deficiency, unspecified: Secondary | ICD-10-CM | POA: Diagnosis not present

## 2022-04-14 DIAGNOSIS — Z125 Encounter for screening for malignant neoplasm of prostate: Secondary | ICD-10-CM | POA: Diagnosis not present

## 2022-04-14 DIAGNOSIS — E538 Deficiency of other specified B group vitamins: Secondary | ICD-10-CM

## 2022-04-14 DIAGNOSIS — R739 Hyperglycemia, unspecified: Secondary | ICD-10-CM

## 2022-04-14 LAB — CBC WITH DIFFERENTIAL/PLATELET
Basophils Absolute: 0 10*3/uL (ref 0.0–0.1)
Basophils Relative: 0.7 % (ref 0.0–3.0)
Eosinophils Absolute: 0.1 10*3/uL (ref 0.0–0.7)
Eosinophils Relative: 1 % (ref 0.0–5.0)
HCT: 48.6 % (ref 39.0–52.0)
Hemoglobin: 16.6 g/dL (ref 13.0–17.0)
Lymphocytes Relative: 30 % (ref 12.0–46.0)
Lymphs Abs: 1.9 10*3/uL (ref 0.7–4.0)
MCHC: 34 g/dL (ref 30.0–36.0)
MCV: 97.7 fl (ref 78.0–100.0)
Monocytes Absolute: 0.6 10*3/uL (ref 0.1–1.0)
Monocytes Relative: 9.7 % (ref 3.0–12.0)
Neutro Abs: 3.7 10*3/uL (ref 1.4–7.7)
Neutrophils Relative %: 58.6 % (ref 43.0–77.0)
Platelets: 192 10*3/uL (ref 150.0–400.0)
RBC: 4.98 Mil/uL (ref 4.22–5.81)
RDW: 13.2 % (ref 11.5–15.5)
WBC: 6.3 10*3/uL (ref 4.0–10.5)

## 2022-04-14 LAB — URINALYSIS, ROUTINE W REFLEX MICROSCOPIC
Bilirubin Urine: NEGATIVE
Hgb urine dipstick: NEGATIVE
Ketones, ur: NEGATIVE
Leukocytes,Ua: NEGATIVE
Nitrite: NEGATIVE
RBC / HPF: NONE SEEN (ref 0–?)
Specific Gravity, Urine: 1.01 (ref 1.000–1.030)
Total Protein, Urine: NEGATIVE
Urine Glucose: NEGATIVE
Urobilinogen, UA: 0.2 (ref 0.0–1.0)
pH: 6.5 (ref 5.0–8.0)

## 2022-04-14 LAB — LIPID PANEL
Cholesterol: 130 mg/dL (ref 0–200)
HDL: 50.3 mg/dL (ref >39.00)
LDL Cholesterol: 63 mg/dL (ref 0–99)
NonHDL: 79.58
Total CHOL/HDL Ratio: 3
Triglycerides: 81 mg/dL (ref 0.0–149.0)
VLDL: 16.2 mg/dL (ref 0.0–40.0)

## 2022-04-14 LAB — TSH: TSH: 0.96 u[IU]/mL (ref 0.35–5.50)

## 2022-04-14 LAB — HEPATIC FUNCTION PANEL
ALT: 14 U/L (ref 0–53)
AST: 20 U/L (ref 0–37)
Albumin: 4.2 g/dL (ref 3.5–5.2)
Alkaline Phosphatase: 63 U/L (ref 39–117)
Bilirubin, Direct: 0.2 mg/dL (ref 0.0–0.3)
Total Bilirubin: 0.9 mg/dL (ref 0.2–1.2)
Total Protein: 6.6 g/dL (ref 6.0–8.3)

## 2022-04-14 LAB — BASIC METABOLIC PANEL
BUN: 16 mg/dL (ref 6–23)
CO2: 30 mEq/L (ref 19–32)
Calcium: 9.2 mg/dL (ref 8.4–10.5)
Chloride: 102 mEq/L (ref 96–112)
Creatinine, Ser: 1.15 mg/dL (ref 0.40–1.50)
GFR: 62.07 mL/min (ref >60.00)
Glucose, Bld: 88 mg/dL (ref 70–99)
Potassium: 4.4 mEq/L (ref 3.5–5.1)
Sodium: 139 mEq/L (ref 135–145)

## 2022-04-14 LAB — VITAMIN B12: Vitamin B-12: 238 pg/mL (ref 211–911)

## 2022-04-14 LAB — VITAMIN D 25 HYDROXY (VIT D DEFICIENCY, FRACTURES): VITD: 48.48 ng/mL (ref 30.00–100.00)

## 2022-04-14 LAB — HEMOGLOBIN A1C: Hgb A1c MFr Bld: 5.6 % (ref 4.6–6.5)

## 2022-04-14 LAB — PSA: PSA: 1.35 ng/mL (ref 0.10–4.00)

## 2022-04-14 MED ORDER — EZETIMIBE 10 MG PO TABS: 10.0000 mg | ORAL_TABLET | Freq: Every day | ORAL | 3 refills | Status: DC

## 2022-04-14 MED ORDER — PRAVASTATIN SODIUM 80 MG PO TABS: 80.0000 mg | ORAL_TABLET | Freq: Every evening | ORAL | 3 refills | Status: DC

## 2022-04-14 NOTE — Patient Instructions (Addendum)
Please have your Shingrix (shingles) shots done at your local pharmacy if not done already  Please also have the Flu shot (high dose) at the pharmacy as well, since we are not yet stocked here  Please continue all other medications as before, and refills have been done if requested.  Please have the pharmacy call with any other refills you may need.  Please continue your efforts at being more active, low cholesterol diet, and weight control.  You are otherwise up to date with prevention measures today.  Please keep your appointments with your specialists as you may have planned  Please go to the LAB at the blood drawing area for the tests to be done  You will be contacted by phone if any changes need to be made immediately.  Otherwise, you will receive a letter about your results with an explanation, but please check with MyChart first.  Please remember to sign up for MyChart if you have not done so, as this will be important to you in the future with finding out test results, communicating by private email, and scheduling acute appointments online when needed.  Please make an Appointment to return for your 1 year visit, or sooner if needed, with Lab testing by Appointment as well, to be done about 3-5 days before at the [facility] (so this is for TWO appointments - please see the scheduling desk as you leave)

## 2022-04-14 NOTE — Progress Notes (Signed)
Patient ID: Jason Hart, male   DOB: 03-03-1946, 76 y.o.   MRN: [id number]         Chief Complaint:: wellness exam        HPI:  Jason Hart is a 76 y.o. male here for wellness exam; declines flu shot, covid booster and plans to consider having shingrix at the local pharmacy, o/w up to date                        Also Pt denies chest pain, increased sob or doe, wheezing, orthopnea, PND, increased LE swelling, palpitations, dizziness or syncope.   Pt denies polydipsia, polyuria, or new focal neuro s/s.    Pt denies fever, wt loss, night sweats, loss of appetite, or other constitutional symptoms  No other new complaints Wt Readings from Last 3 Encounters:  04/14/22 169 lb (76.7 kg)  06/24/21 165 lb 12.8 oz (75.2 kg)  04/10/21 167 lb 3.2 oz (75.8 kg)   BP Readings from Last 3 Encounters:  04/14/22 128/66  06/24/21 120/80  04/10/21 120/80   Immunization History  Administered Date(s) Administered   Fluad Quad(high Dose 65+) 05/08/2019, 04/10/2021   Influenza, High Dose Seasonal PF 05/28/2016   PFIZER(Purple Top)SARS-COV-2 Vaccination 09/01/2019, 09/19/2019, 04/23/2020, 08/23/2020   Pneumococcal Conjugate-13 02/24/2016   Pneumococcal Polysaccharide-23 03/16/2017   Tetanus 02/14/2013   Zoster, Live 09/09/2015   Health Maintenance Due  Topic Date Due   INFLUENZA VACCINE  03/10/2022      Past Medical History:  Diagnosis Date   Arthritis    BPH (benign prostatic hypertrophy)    Conductive hearing loss    Hyperlipidemia    Psoriatic arthritis ([place])    Trigger point with temporomandibular joint pain    Past Surgical History:  Procedure Laterality Date   COLONOSCOPY  2006   Negative;due 2016; [facility]   TONSILLECTOMY      reports that he has never smoked. He has never used smokeless tobacco. He reports that he does not drink alcohol and does not use drugs. family history includes Benign prostatic hyperplasia in his brother; CAD in his brother and brother; Coronary  artery disease (age of onset: 74) in his brother; Heart attack (age of onset: 58) in his father; Heart attack (age of onset: 18) in his paternal grandfather; Hyperlipidemia in his father; Prostate cancer in his brother; Sudden death in his father; Throat cancer in his mother. No Known Allergies Current Outpatient Medications on File Prior to Visit  Medication Sig Dispense Refill   aspirin EC 81 MG tablet Take 1 tablet (81 mg total) by mouth daily. Swallow whole. 90 tablet 3   folic acid (FOLVITE) 1 MG tablet Take 1 tablet (1 mg total) by mouth daily. 90 tablet 3   methotrexate (RHEUMATREX) 2.5 MG tablet Take 2.5 mg by mouth once a week. Caution:Chemotherapy. Protect from light.  4 tablets     No current facility-administered medications on file prior to visit.        ROS:  All others reviewed and negative.  Objective        PE:  BP 128/66 (BP Location: Right Arm, Patient Position: Sitting, Cuff Size: Large)   Pulse 70   Temp 98.3 F (36.8 C) (Oral)   Ht 5' 7.5" (1.715 m)   Wt 169 lb (76.7 kg)   SpO2 95%   BMI 26.08 kg/m                 Constitutional:  Pt appears in NAD               HENT: Head: NCAT.                Right Ear: External ear normal.                 Left Ear: External ear normal.                Eyes: . Pupils are equal, round, and reactive to light. Conjunctivae and EOM are normal               Nose: without d/c or deformity               Neck: Neck supple. Gross normal ROM               Cardiovascular: Normal rate and regular rhythm.                 Pulmonary/Chest: Effort normal and breath sounds without rales or wheezing.                Abd:  Soft, NT, ND, + BS, no organomegaly               Neurological: Pt is alert. At baseline orientation, motor grossly intact               Skin: Skin is warm. No rashes, no other new lesions, LE edema - none               Psychiatric: Pt behavior is normal without agitation   Micro: none  Cardiac tracings I have personally  interpreted today:  none  Pertinent Radiological findings (summarize): none   Lab Results  Component Value Date   WBC 6.3 04/14/2022   HGB 16.6 04/14/2022   HCT 48.6 04/14/2022   PLT 192.0 04/14/2022   GLUCOSE 88 04/14/2022   CHOL 130 04/14/2022   TRIG 81.0 04/14/2022   HDL 50.30 04/14/2022   LDLDIRECT 137.3 10/25/2009   LDLCALC 63 04/14/2022   ALT 14 04/14/2022   AST 20 04/14/2022   NA 139 04/14/2022   K 4.4 04/14/2022   CL 102 04/14/2022   CREATININE 1.15 04/14/2022   BUN 16 04/14/2022   CO2 30 04/14/2022   TSH 0.96 04/14/2022   PSA 1.35 04/14/2022   INR 1.2 02/23/2020   HGBA1C 5.6 04/14/2022   Assessment/Plan:  Jason Hart is a 76 y.o. White or Caucasian [1] male with  has a past medical history of Arthritis, BPH (benign prostatic hypertrophy), Conductive hearing loss, Hyperlipidemia, Psoriatic arthritis ([place]), and Trigger point with temporomandibular joint pain.  Encounter for well adult exam with abnormal findings Age and sex appropriate education and counseling updated with regular exercise and diet Referrals for preventative services - none needed Immunizations addressed - for shingrix at local pharmacy as well as flu shot, and covid booster Smoking counseling  - none needed Evidence for depression or other mood disorder - none significant Most recent labs reviewed. I have personally reviewed and have noted: 1) the patient's medical and social history 2) The patient's current medications and supplements 3) The patient's height, weight, and BMI have been recorded in the chart   Hyperglycemia Lab Results  Component Value Date   HGBA1C 5.6 04/14/2022   Stable, pt to continue current medical treatment  - diet , wt control, excercise   Hyperlipidemia Lab Results  Component Value Date   LDLCALC 63 04/14/2022  Stable, pt to continue current statin zetia 10 mg qd, and pravachol 80 mg qd  Followup: Return in about 1 year (around 04/15/2023).  Cathlean Cower, MD 04/16/2022 9:40 PM [facility]

## 2022-04-16 ENCOUNTER — Encounter: Payer: Self-pay | Admitting: Internal Medicine

## 2022-04-16 NOTE — Assessment & Plan Note (Signed)
Lab Results  Component Value Date   HGBA1C 5.6 04/14/2022   Stable, pt to continue current medical treatment  - diet , wt control, excercise

## 2022-04-16 NOTE — Assessment & Plan Note (Signed)
Lab Results  Component Value Date   LDLCALC 63 04/14/2022   Stable, pt to continue current statin zetia 10 mg qd, and pravachol 80 mg qd

## 2022-04-16 NOTE — Assessment & Plan Note (Signed)
Age and sex appropriate education and counseling updated with regular exercise and diet Referrals for preventative services - none needed Immunizations addressed - for shingrix at local pharmacy as well as flu shot, and covid booster Smoking counseling  - none needed Evidence for depression or other mood disorder - none significant Most recent labs reviewed. I have personally reviewed and have noted: 1) the patient's medical and social history 2) The patient's current medications and supplements 3) The patient's height, weight, and BMI have been recorded in the chart

## 2022-05-05 DIAGNOSIS — L4050 Arthropathic psoriasis, unspecified: Secondary | ICD-10-CM | POA: Diagnosis not present

## 2022-05-18 DIAGNOSIS — H524 Presbyopia: Secondary | ICD-10-CM | POA: Diagnosis not present

## 2022-05-18 DIAGNOSIS — H2513 Age-related nuclear cataract, bilateral: Secondary | ICD-10-CM | POA: Diagnosis not present

## 2022-06-23 ENCOUNTER — Ambulatory Visit: Payer: Medicare HMO

## 2022-06-25 ENCOUNTER — Ambulatory Visit (INDEPENDENT_AMBULATORY_CARE_PROVIDER_SITE_OTHER): Payer: Medicare HMO

## 2022-06-25 VITALS — Ht 67.5 in

## 2022-06-25 DIAGNOSIS — Z0000 Encounter for general adult medical examination without abnormal findings: Secondary | ICD-10-CM

## 2022-06-25 NOTE — Progress Notes (Signed)
Virtual Visit via Telephone Note  I connected with  Jason Hart on 06/25/22 at 11:00 AM EST by telephone and verified that I am speaking with the correct person using two identifiers.  Location: Patient: Home Provider: [place] Persons participating in the virtual visit: [place]   I discussed the limitations, risks, security and privacy concerns of performing an evaluation and management service by telephone and the availability of in person appointments. The patient expressed understanding and agreed to proceed.  Interactive audio and video telecommunications were attempted between this nurse and patient, however failed, due to patient having technical difficulties OR patient did not have access to video capability.  We continued and completed visit with audio only.  Some vital signs may be absent or patient reported.   Sheral Flow, LPN  Subjective:   Jason Hart is a 76 y.o. male who presents for an Initial Medicare Annual Wellness Visit.  Review of Systems     Cardiac Risk Factors include: advanced age (>29mn, >>70women);dyslipidemia;family history of premature cardiovascular disease;male gender     Objective:    Today's Vitals   06/25/22 1110  Height: 5' 7.5" (1.715 m)  PainSc: 0-No pain   Body mass index is 26.08 kg/m.     06/25/2022   11:04 AM 02/23/2020    2:38 PM 03/05/2015    7:32 AM  Advanced Directives  Does Patient Have a Medical Advance Directive? Yes No Yes  Type of AParamedicof AMignonLiving will  HSecurity-[place]ide[place] will  Copy of HP[place]in Chart? No - copy requested    Would patient like information on creating a medical advance directive?  No - Patient declined     Current Medications (verified) Outpatient Encounter Medications as of 06/25/2022  Medication Sig   aspirin EC 81 MG tablet Take 1 tablet (81 mg total) by mouth daily.  Swallow whole.   ezetimibe (ZETIA) 10 MG tablet Take 1 tablet (10 mg total) by mouth daily.   folic acid (FOLVITE) 1 MG tablet Take 1 tablet (1 mg total) by mouth daily.   methotrexate (RHEUMATREX) 2.5 MG tablet Take 2.5 mg by mouth once a week. Caution:Chemotherapy. Protect from light.  4 tablets   pravastatin (PRAVACHOL) 80 MG tablet Take 1 tablet (80 mg total) by mouth every evening.   No facility-administered encounter medications on file as of 06/25/2022.    Allergies (verified) Patient has no known allergies.   History: Past Medical History:  Diagnosis Date   Arthritis    BPH (benign prostatic hypertrophy)    Conductive hearing loss    Hyperlipidemia    Psoriatic arthritis ([place]    Trigger point with temporomandibular joint pain    Past Surgical History:  Procedure Laterality Date   COLONOSCOPY  2006   Negative;due 2016; [facility]   TONSILLECTOMY     Family History  Problem Relation Age of Onset   Throat cancer Mother        smoker   Hyperlipidemia Father    Heart attack Father 526  Sudden death Father    Heart attack Paternal Grandfather 660  Coronary artery disease Brother 624      stent   CAD Brother        stent   Prostate cancer Brother    Benign prostatic hyperplasia Brother        TURP   CAD Brother  stent   Hypertension Neg Hx    Diabetes Neg Hx    Stroke Neg Hx    Colon cancer Neg Hx    Colon polyps Neg Hx    Rectal cancer Neg Hx    Stomach cancer Neg Hx    Social History   Socioeconomic History   Marital status: Widowed    Spouse name: Not on file   Number of children: Not on file   Years of education: Not on file   Highest education level: Not on file  Occupational History   Occupation: Scientist, clinical (histocompatibility and immunogenetics): [employer]  Tobacco Use   Smoking status: Never   Smokeless tobacco: Never  Vaping Use   Vaping Use: Never used  Substance and Sexual Activity   Alcohol use: No    Alcohol/week: 0.0 standard drinks of  alcohol    Comment: not since MTX started; occasional   Drug use: No   Sexual activity: Not on file  Other Topics Concern   Not on file  Social History Narrative   Fun/Hobby: Air cabin crew    Social Determinants of Health   Financial Resource Strain: Low Risk  (06/25/2022)   Overall Financial Resource Strain (CARDIA)    Difficulty of Paying Living Expenses: Not hard at all  Food Insecurity: No Food Insecurity (06/25/2022)   Hunger Vital Sign    Worried About Running Out of Food in the Last Year: Never true    [place] in the Last Year: Never true  Transportation Needs: No Transportation Needs (06/25/2022)   PRAPARE - Hydrologist (Medical): No    Lack of Transportation (Non-Medical): No  Physical Activity: Sufficiently Active (06/25/2022)   Exercise Vital Sign    Days of Exercise per Week: 7 days    Minutes of Exercise per Session: 30 min  Stress: No Stress Concern Present (06/25/2022)   [place]    Feeling of Stress : Not at all  Social Connections: Moderately Integrated (06/25/2022)   Social Connection and Isolation Panel [NHANES]    Frequency of Communication with Friends and Family: More than three times a week    Frequency of Social Gatherings with Friends and Family: More than three times a week    Attends Religious Services: More than 4 times per year    Active Member of [employer] or Organizations: Yes    Attends Archivist Meetings: More than 4 times per year    Marital Status: Widowed    Tobacco Counseling Counseling given: Not Answered   Clinical Intake:  Pre-visit preparation completed: Yes  Pain : No/denies pain Pain Score: 0-No pain     BMI - recorded: 26.08 (04/14/2022) Nutritional Status: BMI 25 -29 Overweight Nutritional Risks: None Diabetes: No  How often do you need to have someone help you when you read instructions, pamphlets, or other  written materials from your doctor or pharmacy?: 1 - Never What is the last grade level you completed in school?: HSG  Diabetic? No  Interpreter Needed?: No  Information entered by :: Lisette Abu, LPN.   Activities of Daily Living    06/25/2022   11:09 AM  In your present state of health, do you have any difficulty performing the following activities:  Hearing? 0  Vision? 0  Difficulty concentrating or making decisions? 0  Walking or climbing stairs? 0  Dressing or bathing? 0  Doing errands, shopping? 0  Preparing  Food and eating ? N  Using the Toilet? N  In the past six months, have you accidently leaked urine? N  Do you have problems with loss of bowel control? N  Managing your Medications? N  Managing your Finances? N  Housekeeping or managing your Housekeeping? N    Patient Care Team: Biagio Borg, MD as PCP - General (Internal Medicine) Sueanne Margarita, MD as PCP - Cardiology (Cardiology) Jola Schmidt, MD as Consulting Physician (Ophthalmology)  Indicate any recent Medical Services you may have received from other than [facility] providers in the past year (date may be approximate).     Assessment:   This is a routine wellness examination for [facility]  Hearing/Vision screen Hearing Screening - Comments:: Denies hearing difficulties   Vision Screening - Comments:: Wears rx glasses - up to date with routine eye exams with Jola Schmidt, MD.   Dietary issues and exercise activities discussed: Current Exercise Habits: Home exercise routine;Structured exercise class, Type of exercise: walking, Time (Minutes): 60, Frequency (Times/Week): 7, Weekly Exercise (Minutes/Week): 420, Intensity: Moderate, Exercise limited by: None identified   Goals Addressed             This Visit's Progress    Client understands the importance of follow-up with providers by attending scheduled visits        Depression Screen    06/25/2022   11:07 AM 04/14/2022    8:04 AM  04/10/2021    8:59 AM 03/27/2020    3:56 PM 03/05/2020   11:07 AM 02/24/2019   10:40 AM 02/24/2019   10:08 AM  PHQ 2/9 Scores  PHQ - 2 Score 0 0 0 0 0 0 0  PHQ- 9 Score  0         Fall Risk    06/25/2022   11:05 AM 04/14/2022    8:04 AM 04/10/2021    8:59 AM 03/27/2020    3:56 PM 03/05/2020   11:07 AM  [place] in the past year? 0 0 0 0 1  Number falls in past yr: 0  0  0  Injury with Fall? 0 0 0  0  Risk for fall due to : No Fall Risks No Fall Risks   History of fall(s)  Follow up Falls prevention discussed Falls evaluation completed   Falls evaluation completed    [place]:  Any stairs in or around the home? No  If so, are there any without handrails? No  Home free of loose throw rugs in walkways, pet beds, electrical cords, etc? Yes  Adequate lighting in your home to reduce risk of falls? Yes   ASSISTIVE DEVICES UTILIZED TO PREVENT FALLS:  Life alert? Yes  Use of a cane, walker or w/c? No  Grab bars in the bathroom? No  Shower chair or bench in shower? Yes  Elevated toilet seat or a handicapped toilet? Yes   TIMED UP AND GO:  Was the test performed? No . Phone Visit  Cognitive Function:        06/25/2022   11:10 AM  6CIT Screen  What Year? 0 points  What month? 0 points  What time? 0 points  Count back from 20 0 points  Months in reverse 0 points  Repeat phrase 0 points  Total Score 0 points    Immunizations Immunization History  Administered Date(s) Administered   Fluad Quad(high Dose 65+) 05/08/2019, 04/10/2021, 04/18/2022   Influenza, High Dose Seasonal PF  05/28/2016   PFIZER Comirnaty(Gray Top)Covid-19 Tri-Sucrose Vaccine 05/22/2022   PFIZER(Purple Top)SARS-COV-2 Vaccination 09/01/2019, 09/19/2019, 04/23/2020, 08/23/2020   Pfizer Covid-19 Vaccine Bivalent Booster 47yr & up 05/05/2021   Pneumococcal Conjugate-13 02/24/2016   Pneumococcal Polysaccharide-23 03/16/2017   Tetanus 02/14/2013   Zoster Recombinat  (Shingrix) 01/08/2022   Zoster, Live 09/09/2015    TDAP status: Up to date  Flu Vaccine status: Up to date  Pneumococcal vaccine status: Up to date  Covid-19 vaccine status: Completed vaccines  Qualifies for Shingles Vaccine? Yes   Zostavax completed Yes   Shingrix Completed?: No.    Education has been provided regarding the importance of this vaccine. Patient has been advised to call insurance company to determine out of pocket expense if they have not yet received this vaccine. Advised may also receive vaccine at local pharmacy or Health Dept. Verbalized acceptance and understanding.  Screening Tests Health Maintenance  Topic Date Due   Zoster Vaccines- Shingrix (2 of 2) 07/14/2022 (Originally 03/05/2022)   COVID-19 Vaccine (7 - Pfizer risk series) 07/17/2022   TETANUS/TDAP  02/15/2023   Medicare Annual Wellness (AWV)  06/26/2023   Pneumonia Vaccine 76 Years old  Completed   INFLUENZA VACCINE  Completed   Hepatitis C Screening  Completed   HPV VACCINES  Aged Out   COLONOSCOPY (Pts 45-454yrInsurance coverage will need to be confirmed)  Discontinued    Health Maintenance  There are no preventive care reminders to display for this patient.   Colorectal cancer screening: No longer required.   Lung Cancer Screening: (Low Dose CT Chest recommended if Age 76-80ears, 30 pack-year currently smoking OR have quit w/in 15years.) does not qualify.   Lung Cancer Screening Referral: no  Additional Screening:  Hepatitis C Screening: does qualify; Completed 02/24/2016  Vision Screening: Recommended annual ophthalmology exams for early detection of glaucoma and other disorders of the eye. Is the patient up to date with their annual eye exam?  Yes  Who is the provider or what is the name of the office in which the patient attends annual eye exams? [facility] Schmidt[facility]. If pt is not established with a provider, would they like to be referred to a provider to establish care? No .    Dental Screening: Recommended annual dental exams for proper oral hygiene  Community Resource Referral / Chronic Care Management: CRR required this visit?  No   CCM required this visit?  No      Plan:     I have personally reviewed and noted the following in the patient's chart:   Medical and social history Use of alcohol, tobacco or illicit drugs  Current medications and supplements including opioid prescriptions. Patient is not currently taking opioid prescriptions. Functional ability and status Nutritional status Physical activity Advanced directives List of other physicians Hospitalizations, surgeries, and ER visits in previous 12 months Vitals Screenings to include cognitive, depression, and falls Referrals and appointments  In addition, I have reviewed and discussed with patient certain preventive protocols, quality metrics, and best practice recommendations. A written personalized care plan for preventive services as well as general preventive health recommendations were provided to patient.     ShSheral FlowLPN   [id number] Nurse Notes: N/A

## 2022-06-25 NOTE — Patient Instructions (Signed)
Jason Hart , Thank you for taking time to come for your Medicare Wellness Visit. I appreciate your ongoing commitment to your health goals. Please review the following plan we discussed and let me know if I can assist you in the future.   These are the goals we discussed:  Goals      Client understands the importance of follow-up with providers by attending scheduled visits        This is a list of the screening recommended for you and due dates:  Health Maintenance  Topic Date Due   Zoster (Shingles) Vaccine (2 of 2) 07/14/2022*   COVID-19 Vaccine (7 - Pfizer risk series) 07/17/2022   Tetanus Vaccine  02/15/2023   Medicare Annual Wellness Visit  06/26/2023   Pneumonia Vaccine  Completed   Flu Shot  Completed   Hepatitis C Screening: USPSTF Recommendation to screen - Ages 18-79 yo.  Completed   HPV Vaccine  Aged Out   Colon Cancer Screening  Discontinued  *Topic was postponed. The date shown is not the original due date.    Advanced directives: Yes  Conditions/risks identified: Yes  Next appointment: Follow up in one year for your annual wellness visit.   Preventive Care 17 Years and Older, Male  Preventive care refers to lifestyle choices and visits with your health care provider that can promote health and wellness. What does preventive care include? A yearly physical exam. This is also called an annual well check. Dental exams once or twice a year. Routine eye exams. Ask your health care provider how often you should have your eyes checked. Personal lifestyle choices, including: Daily care of your teeth and gums. Regular physical activity. Eating a healthy diet. Avoiding tobacco and drug use. Limiting alcohol use. Practicing safe sex. Taking low doses of aspirin every day. Taking vitamin and mineral supplements as recommended by your health care provider. What happens during an annual well check? The services and screenings done by your health care provider  during your annual well check will depend on your age, overall health, lifestyle risk factors, and family history of disease. Counseling  Your health care provider may ask you questions about your: Alcohol use. Tobacco use. Drug use. Emotional well-being. Home and relationship well-being. Sexual activity. Eating habits. History of falls. Memory and ability to understand (cognition). Work and work Statistician. Screening  You may have the following tests or measurements: Height, weight, and BMI. Blood pressure. Lipid and cholesterol levels. These may be checked every 5 years, or more frequently if you are over 69 years old. Skin check. Lung cancer screening. You may have this screening every year starting at age 49 if you have a 30-pack-year history of smoking and currently smoke or have quit within the past 15 years. Fecal occult blood test (FOBT) of the stool. You may have this test every year starting at age 90. Flexible sigmoidoscopy or colonoscopy. You may have a sigmoidoscopy every 5 years or a colonoscopy every 10 years starting at age 56. Prostate cancer screening. Recommendations will vary depending on your family history and other risks. Hepatitis C blood test. Hepatitis B blood test. Sexually transmitted disease (STD) testing. Diabetes screening. This is done by checking your blood sugar (glucose) after you have not eaten for a while (fasting). You may have this done every 1-3 years. Abdominal aortic aneurysm (AAA) screening. You may need this if you are a current or former smoker. Osteoporosis. You may be screened starting at age 109 if you are  at high risk. Talk with your health care provider about your test results, treatment options, and if necessary, the need for more tests. Vaccines  Your health care provider may recommend certain vaccines, such as: Influenza vaccine. This is recommended every year. Tetanus, diphtheria, and acellular pertussis (Tdap, Td) vaccine. You  may need a Td booster every 10 years. Zoster vaccine. You may need this after age 64. Pneumococcal 13-valent conjugate (PCV13) vaccine. One dose is recommended after age 70. Pneumococcal polysaccharide (PPSV23) vaccine. One dose is recommended after age 3. Talk to your health care provider about which screenings and vaccines you need and how often you need them. This information is not intended to replace advice given to you by your health care provider. Make sure you discuss any questions you have with your health care provider. Document Released: 08/23/2015 Document Revised: 04/15/2016 Document Reviewed: 05/28/2015 Elsevier Interactive Patient Education  2017 [place] Prevention in the Home Falls can cause injuries. They can happen to people of all ages. There are many things you can do to make your home safe and to help prevent falls. What can I do on the outside of my home? Regularly fix the edges of walkways and driveways and fix any cracks. Remove anything that might make you trip as you walk through a door, such as a raised step or threshold. Trim any bushes or trees on the path to your home. Use bright outdoor lighting. Clear any walking paths of anything that might make someone trip, such as rocks or tools. Regularly check to see if handrails are loose or broken. Make sure that both sides of any steps have handrails. Any raised decks and porches should have guardrails on the edges. Have any leaves, snow, or ice cleared regularly. Use sand or salt on walking paths during winter. Clean up any spills in your garage right away. This includes oil or grease spills. What can I do in the bathroom? Use night lights. Install grab bars by the toilet and in the tub and shower. Do not use towel bars as grab bars. Use non-skid mats or decals in the tub or shower. If you need to sit down in the shower, use a plastic, non-slip stool. Keep the floor dry. Clean up any water that spills  on the floor as soon as it happens. Remove soap buildup in the tub or shower regularly. Attach bath mats securely with double-sided non-slip rug tape. Do not have throw rugs and other things on the floor that can make you trip. What can I do in the bedroom? Use night lights. Make sure that you have a light by your bed that is easy to reach. Do not use any sheets or blankets that are too big for your bed. They should not hang down onto the floor. Have a firm chair that has side arms. You can use this for support while you get dressed. Do not have throw rugs and other things on the floor that can make you trip. What can I do in the kitchen? Clean up any spills right away. Avoid walking on wet floors. Keep items that you use a lot in easy-to-reach places. If you need to reach something above you, use a strong step stool that has a grab bar. Keep electrical cords out of the way. Do not use floor polish or wax that makes floors slippery. If you must use wax, use non-skid floor wax. Do not have throw rugs and other things on the floor  that can make you trip. What can I do with my stairs? Do not leave any items on the stairs. Make sure that there are handrails on both sides of the stairs and use them. Fix handrails that are broken or loose. Make sure that handrails are as long as the stairways. Check any carpeting to make sure that it is firmly attached to the stairs. Fix any carpet that is loose or worn. Avoid having throw rugs at the top or bottom of the stairs. If you do have throw rugs, attach them to the floor with carpet tape. Make sure that you have a light switch at the top of the stairs and the bottom of the stairs. If you do not have them, ask someone to add them for you. What else can I do to help prevent falls? Wear shoes that: Do not have high heels. Have rubber bottoms. Are comfortable and fit you well. Are closed at the toe. Do not wear sandals. If you use a stepladder: Make  sure that it is fully opened. Do not climb a closed stepladder. Make sure that both sides of the stepladder are locked into place. Ask someone to hold it for you, if possible. Clearly mark and make sure that you can see: Any grab bars or handrails. First and last steps. Where the edge of each step is. Use tools that help you move around (mobility aids) if they are needed. These include: Canes. Walkers. Scooters. Crutches. Turn on the lights when you go into a dark area. Replace any light bulbs as soon as they burn out. Set up your furniture so you have a clear path. Avoid moving your furniture around. If any of your floors are uneven, fix them. If there are any pets around you, be aware of where they are. Review your medicines with your doctor. Some medicines can make you feel dizzy. This can increase your chance of falling. Ask your doctor what other things that you can do to help prevent falls. This information is not intended to replace advice given to you by your health care provider. Make sure you discuss any questions you have with your health care provider. Document Released: 05/23/2009 Document Revised: 01/02/2016 Document Reviewed: 08/31/2014 Elsevier Interactive Patient Education  2017 [employer].

## 2022-08-11 DIAGNOSIS — L4050 Arthropathic psoriasis, unspecified: Secondary | ICD-10-CM | POA: Diagnosis not present

## 2022-08-11 DIAGNOSIS — Z79899 Other long term (current) drug therapy: Secondary | ICD-10-CM | POA: Diagnosis not present

## 2022-08-11 DIAGNOSIS — E663 Overweight: Secondary | ICD-10-CM | POA: Diagnosis not present

## 2022-08-11 DIAGNOSIS — Z6827 Body mass index (BMI) 27.0-27.9, adult: Secondary | ICD-10-CM | POA: Diagnosis not present

## 2022-08-20 DIAGNOSIS — R69 Illness, unspecified: Secondary | ICD-10-CM | POA: Diagnosis not present

## 2022-08-22 DIAGNOSIS — Z0100 Encounter for examination of eyes and vision without abnormal findings: Secondary | ICD-10-CM | POA: Diagnosis not present

## 2022-08-26 ENCOUNTER — Ambulatory Visit: Payer: Medicare HMO | Admitting: Family Medicine

## 2022-08-26 ENCOUNTER — Encounter: Payer: Self-pay | Admitting: Family Medicine

## 2022-08-26 VITALS — BP 124/78 | Ht 67.0 in | Wt 170.0 lb

## 2022-08-26 DIAGNOSIS — M25562 Pain in left knee: Secondary | ICD-10-CM

## 2022-08-26 NOTE — Patient Instructions (Signed)
You flared up the mild arthritis in this knee. Icing 15 minutes at a time 3-4 times a day for next few days then as needed. Aleve 1-2 tabs twice a day with food for pain and inflammation as needed. Quad strengthening exercises most days of the week for next 4 weeks. Activities as tolerated otherwise. Follow up with me as needed.

## 2022-08-26 NOTE — Progress Notes (Signed)
PCP: Biagio Borg, MD  Subjective:   HPI: Patient is a 77 y.o. male here for left knee pain.  Patient denies acute injury. Started to feel pain about 1-2 weeks ago medially when doing exercise that involved some twisting. No swelling, bruising. Occasionally taking aleve or ibuprofen and this is helping. Pain much better than it was. Worsened after going golfing.  Past Medical History:  Diagnosis Date   Arthritis    BPH (benign prostatic hypertrophy)    Conductive hearing loss    Hyperlipidemia    Psoriatic arthritis ([place])    Trigger point with temporomandibular joint pain     Current Outpatient Medications on File Prior to Visit  Medication Sig Dispense Refill   aspirin EC 81 MG tablet Take 1 tablet (81 mg total) by mouth daily. Swallow whole. 90 tablet 3   ezetimibe (ZETIA) 10 MG tablet Take 1 tablet (10 mg total) by mouth daily. 90 tablet 3   folic acid (FOLVITE) 1 MG tablet Take 1 tablet (1 mg total) by mouth daily. 90 tablet 3   methotrexate (RHEUMATREX) 2.5 MG tablet Take 2.5 mg by mouth once a week. Caution:Chemotherapy. Protect from light.  4 tablets     pravastatin (PRAVACHOL) 80 MG tablet Take 1 tablet (80 mg total) by mouth every evening. 90 tablet 3   No current facility-administered medications on file prior to visit.    Past Surgical History:  Procedure Laterality Date   COLONOSCOPY  2006   Negative;due 2016; [facility]   TONSILLECTOMY      No Known Allergies  BP 124/78   Ht '5\' 7"'$  (1.702 m)   Wt 170 lb (77.1 kg)   BMI 26.63 kg/m       No data to display              No data to display              Objective:  Physical Exam:  Gen: NAD, comfortable in exam room  Left knee: No gross deformity, ecchymoses, swelling. TTP medial joint line and over pes insertion mildly. FROM with normal strength. Negative ant/post drawers. Negative valgus/varus testing. Negative lachman.  Negative mcmurrays, apleys, thessalys. NV intact distally.    Assessment & Plan:  1. Left knee pain - 2/2 flare of mild arthritis of knee.  Much improved compared to when this started.  Icing, aleve.  Quad strengthening.  Activities as tolerated.  F/u prn.

## 2022-09-02 DIAGNOSIS — Z1283 Encounter for screening for malignant neoplasm of skin: Secondary | ICD-10-CM | POA: Diagnosis not present

## 2022-09-02 DIAGNOSIS — L570 Actinic keratosis: Secondary | ICD-10-CM | POA: Diagnosis not present

## 2022-09-02 DIAGNOSIS — D225 Melanocytic nevi of trunk: Secondary | ICD-10-CM | POA: Diagnosis not present

## 2022-09-02 DIAGNOSIS — L821 Other seborrheic keratosis: Secondary | ICD-10-CM | POA: Diagnosis not present

## 2022-09-02 DIAGNOSIS — X32XXXD Exposure to sunlight, subsequent encounter: Secondary | ICD-10-CM | POA: Diagnosis not present

## 2022-09-05 DIAGNOSIS — R69 Illness, unspecified: Secondary | ICD-10-CM | POA: Diagnosis not present

## 2022-09-07 DIAGNOSIS — R69 Illness, unspecified: Secondary | ICD-10-CM | POA: Diagnosis not present

## 2022-09-23 ENCOUNTER — Ambulatory Visit: Payer: Medicare HMO | Attending: Cardiology | Admitting: Cardiology

## 2022-09-23 ENCOUNTER — Encounter: Payer: Self-pay | Admitting: Cardiology

## 2022-09-23 VITALS — BP 118/68 | HR 66 | Ht 67.0 in | Wt 175.6 lb

## 2022-09-23 DIAGNOSIS — I2583 Coronary atherosclerosis due to lipid rich plaque: Secondary | ICD-10-CM | POA: Diagnosis not present

## 2022-09-23 DIAGNOSIS — I2510 Atherosclerotic heart disease of native coronary artery without angina pectoris: Secondary | ICD-10-CM | POA: Diagnosis not present

## 2022-09-23 DIAGNOSIS — I700 Atherosclerosis of aorta: Secondary | ICD-10-CM | POA: Diagnosis not present

## 2022-09-23 DIAGNOSIS — E7800 Pure hypercholesterolemia, unspecified: Secondary | ICD-10-CM | POA: Diagnosis not present

## 2022-09-23 NOTE — Patient Instructions (Signed)
Medication Instructions:  Your physician recommends that you continue on your current medications as directed. Please refer to the Current Medication list given to you today.  *If you need a refill on your cardiac medications before your next appointment, please call your pharmacy*   Lab Work: None.  If you have labs (blood work) drawn today and your tests are completely normal, you will receive your results only by: [place] (if you have MyChart) OR A paper copy in the mail If you have any lab test that is abnormal or we need to change your treatment, we will call you to review the results.   Testing/Procedures: None.   Follow-Up:  We recommend signing up for the patient portal called "MyChart".  Sign up information is provided on this After Visit Summary.  MyChart is used to connect with patients for Virtual Visits (Telemedicine).  Patients are able to view lab/test results, encounter notes, upcoming appointments, etc.  Non-urgent messages can be sent to your provider as well.   To learn more about what you can do with MyChart, go to [email or web address].    Your next appointment:   1 year(s)  Provider:   Fransico Him, MD

## 2022-09-23 NOTE — Progress Notes (Signed)
Cardiology Office Note    Date:  09/23/2022   ID:  Jason Hart, DOB [birth year], MRN [id number]  PCP:  Biagio Borg, MD  Cardiologist:  Fransico Him, MD   Chief Complaint  Patient presents with   Coronary Artery Disease   Hyperlipidemia     History of Present Illness:  Jason Hart is a 77 y.o. male  with a hx of HLD, psoriatic arthritis and BPH who underwent screening coronary CA scoring to assess cardiac risk.  His calcium score was elevated at 225 but only 39th % for age and sex matched controls.  He also has aortic atherosclerosis.  Coronary CTA showed coronary calcium score 316 with 25 to 49% stenosis throughout the RCA, small patent ramus, 50 to 69% mid LAD proximal to the takeoff of D2 followed by 25 to 49% stenosis on 09/04/2020 with FFR showing no focal stenosis.  He has never smoked but has a strong exposure hx of second hand smoke from both parents growing up.  He also has a fm hx of premature CAD.  His father died of an SCD/MI at age 31 and his brother had an MI at age 65 and paternal GF had an MI at 47.    He is here today for followup and is doing well.  He denies any chest pain or pressure, SOB, DOE, PND, orthopnea, LE edema, dizziness, palpitations or syncope. He is compliant with his meds and is tolerating meds with no SE.    Past Medical History:  Diagnosis Date   Arthritis    BPH (benign prostatic hypertrophy)    CAD (coronary artery disease), native coronary artery    Coronary CTA showed coronary calcium score 316 with 25 to 49% stenosis throughout the RCA, small patent ramus, 50 to 69% mid LAD proximal to the takeoff of D2 followed by 25 to 49% stenosis on 09/04/2020 with FFR showing no focal stenosis.   Conductive hearing loss    Hyperlipidemia    Psoriatic arthritis (HCC)    Trigger point with temporomandibular joint pain     Past Surgical History:  Procedure Laterality Date   COLONOSCOPY  2006   Negative;due 2016; [facility]    TONSILLECTOMY      Current Medications: Current Meds  Medication Sig   aspirin EC 81 MG tablet Take 1 tablet (81 mg total) by mouth daily. Swallow whole.   ezetimibe (ZETIA) 10 MG tablet Take 1 tablet (10 mg total) by mouth daily.   folic acid (FOLVITE) 1 MG tablet Take 1 tablet (1 mg total) by mouth daily.   methotrexate (RHEUMATREX) 2.5 MG tablet Take 2.5 mg by mouth once a week. Caution:Chemotherapy. Protect from light.  4 tablets   pravastatin (PRAVACHOL) 80 MG tablet Take 1 tablet (80 mg total) by mouth every evening.    Allergies:   Patient has no known allergies.   Social History   Socioeconomic History   Marital status: Widowed    Spouse name: Not on file   Number of children: Not on file   Years of education: Not on file   Highest education level: Not on file  Occupational History   Occupation: SALES    Employer: [employer]  Tobacco Use   Smoking status: Never   Smokeless tobacco: Never  Vaping Use   Vaping Use: Never used  Substance and Sexual Activity   Alcohol use: No    Alcohol/week: 0.0 standard drinks of alcohol    Comment: not since  MTX started; occasional   Drug use: No   Sexual activity: Not on file  Other Topics Concern   Not on file  Social History Narrative   Fun/Hobby: Air cabin crew    Social Determinants of Health   Financial Resource Strain: Low Risk  (06/25/2022)   Overall Financial Resource Strain (CARDIA)    Difficulty of Paying Living Expenses: Not hard at all  Food Insecurity: No Food Insecurity (06/25/2022)   Hunger Vital Sign    Worried About Running Out of Food in the Last Year: Never true    Ran Out of Food in the Last Year: Never true  Transportation Needs: No Transportation Needs (06/25/2022)   PRAPARE - Hydrologist (Medical): No    Lack of Transportation (Non-Medical): No  Physical Activity: Sufficiently Active (06/25/2022)   Exercise Vital Sign    Days of Exercise per Week: 7 days     Minutes of Exercise per Session: 30 min  Stress: No Stress Concern Present (06/25/2022)   [place]    Feeling of Stress : Not at all  Social Connections: Moderately Integrated (06/25/2022)   Social Connection and Isolation Panel [NHANES]    Frequency of Communication with Friends and Family: More than three times a week    Frequency of Social Gatherings with Friends and Family: More than three times a week    Attends Religious Services: More than 4 times per year    Active Member of [employer] or Organizations: Yes    Attends Archivist Meetings: More than 4 times per year    Marital Status: Widowed     Family History:  The patient's family history includes Benign prostatic hyperplasia in his brother; CAD in his brother and brother; Coronary artery disease (age of onset: 6) in his brother; Heart attack (age of onset: 65) in his father; Heart attack (age of onset: 77) in his paternal grandfather; Hyperlipidemia in his father; Prostate cancer in his brother; Sudden death in his father; Throat cancer in his mother.   ROS:   Please see the history of present illness.    ROS All other systems reviewed and are negative.     06/28/2020    1:29 PM  PAD Screen  Previous PAD dx? No  Previous surgical procedure? No  Pain with walking? No  Feet/toe relief with dangling? No  Painful, non-healing ulcers? No  Extremities discolored? No       PHYSICAL EXAM:   VS:  BP 118/68   Pulse 66   Ht 5' 7"$  (1.702 m)   Wt 175 lb 9.6 oz (79.7 kg)   SpO2 97%   BMI 27.50 kg/m     \GEN: Well nourished, well developed in no acute distress HEENT: Normal NECK: No JVD; No carotid bruits LYMPHATICS: No lymphadenopathy CARDIAC:RRR, no murmurs, rubs, gallops RESPIRATORY:  Clear to auscultation without rales, wheezing or rhonchi  ABDOMEN: Soft, non-tender, non-distended MUSCULOSKELETAL:  No edema; No deformity  SKIN: Warm and  dry NEUROLOGIC:  Alert and oriented x 3 PSYCHIATRIC:  Normal affect   Wt Readings from Last 3 Encounters:  09/23/22 175 lb 9.6 oz (79.7 kg)  08/26/22 170 lb (77.1 kg)  04/14/22 169 lb (76.7 kg)      Studies/Labs Reviewed:   EKG:  EKG is not ordered today.  The ekg ordered today demonstrates NSR with no ST changes  Recent Labs: 04/14/2022: ALT 14; BUN 16; Creatinine, Ser 1.15; Hemoglobin 16.6;  Platelets 192.0; Potassium 4.4; Sodium 139; TSH 0.96   Lipid Panel    Component Value Date/Time   CHOL 130 04/14/2022 0851   CHOL 130 08/26/2021 0830   CHOL 150 02/13/2015 0923   TRIG 81.0 04/14/2022 0851   TRIG 100 02/13/2015 0923   HDL 50.30 04/14/2022 0851   HDL 49 08/26/2021 0830   HDL 46 02/13/2015 0923   CHOLHDL 3 04/14/2022 0851   VLDL 16.2 04/14/2022 0851   LDLCALC 63 04/14/2022 0851   LDLCALC 66 08/26/2021 0830   LDLCALC 98 03/21/2020 0820   LDLCALC 84 02/13/2015 0923   LDLDIRECT 137.3 10/25/2009 0000      Additional studies/ records that were reviewed today include:  Office notes from PCP, EKG    ASSESSMENT:    1. Coronary artery disease due to lipid rich plaque   2. Aortic atherosclerosis (HCC)   3. Pure hypercholesterolemia      PLAN:  In order of problems listed above:  1.  ASCAD -calcium score was elevated at 225 but only 39th % for age and sex matched controls. -Coronary CTA showed coronary calcium score 316 with 25 to 49% stenosis throughout the RCA, small patent ramus, 50 to 69% mid LAD proximal to the takeoff of D2 followed by 25 to 49% stenosis on 09/04/2020 with FFR showing no focal stenosis. -He has a strong fm hx of premature CAD with his father dying of an MI at 76 and his brother having an MI at 3 and GF at 68.  -his CRFs include premature fm hs, extensive second hand smoke exposure as a child and HLD -He remains asymptomatic on exam today -continue  aggressive risk factor modification -continue prescription drug management with ASA 82m daily  and statin -continue in daily exercise routine>>no sx with exercise  3.  Aortic atherosclerosis -noted on Chest CT -continue statin -BP is controlled  2.  HLD -LDL goal is less than 70  -I have personally reviewed and interpreted outside labs performed by patient's PCP which showed  LDL 63, HDL 50 on 04/14/22 -continue prescription drug management with Zetia 159mdaily and Pravastatin 8063maily with PRN refills   Medication Adjustments/Labs and Tests Ordered: Current medicines are reviewed at length with the patient today.  Concerns regarding medicines are outlined above.  Medication changes, Labs and Tests ordered today are listed in the Patient Instructions below.  There are no Patient Instructions on file for this visit.   Signed, TraFransico HimD  09/23/2022 8:39 AM    [place] [email or web address]  [phone]: [phone]: [phone]

## 2022-10-13 DIAGNOSIS — R69 Illness, unspecified: Secondary | ICD-10-CM | POA: Diagnosis not present

## 2022-10-15 DIAGNOSIS — R69 Illness, unspecified: Secondary | ICD-10-CM | POA: Diagnosis not present

## 2022-10-19 DIAGNOSIS — R69 Illness, unspecified: Secondary | ICD-10-CM | POA: Diagnosis not present

## 2022-10-30 DIAGNOSIS — R69 Illness, unspecified: Secondary | ICD-10-CM | POA: Diagnosis not present

## 2022-11-01 DIAGNOSIS — R69 Illness, unspecified: Secondary | ICD-10-CM | POA: Diagnosis not present

## 2022-11-02 DIAGNOSIS — R69 Illness, unspecified: Secondary | ICD-10-CM | POA: Diagnosis not present

## 2022-11-06 DIAGNOSIS — R69 Illness, unspecified: Secondary | ICD-10-CM | POA: Diagnosis not present

## 2022-11-09 DIAGNOSIS — R69 Illness, unspecified: Secondary | ICD-10-CM | POA: Diagnosis not present

## 2022-11-10 DIAGNOSIS — L4050 Arthropathic psoriasis, unspecified: Secondary | ICD-10-CM | POA: Diagnosis not present

## 2022-11-19 DIAGNOSIS — R69 Illness, unspecified: Secondary | ICD-10-CM | POA: Diagnosis not present

## 2022-11-21 DIAGNOSIS — R69 Illness, unspecified: Secondary | ICD-10-CM | POA: Diagnosis not present

## 2022-11-28 DIAGNOSIS — R69 Illness, unspecified: Secondary | ICD-10-CM | POA: Diagnosis not present

## 2022-11-30 DIAGNOSIS — R69 Illness, unspecified: Secondary | ICD-10-CM | POA: Diagnosis not present

## 2022-12-01 DIAGNOSIS — R69 Illness, unspecified: Secondary | ICD-10-CM | POA: Diagnosis not present

## 2022-12-08 DIAGNOSIS — R69 Illness, unspecified: Secondary | ICD-10-CM | POA: Diagnosis not present

## 2022-12-09 DIAGNOSIS — R69 Illness, unspecified: Secondary | ICD-10-CM | POA: Diagnosis not present

## 2022-12-11 DIAGNOSIS — R69 Illness, unspecified: Secondary | ICD-10-CM | POA: Diagnosis not present

## 2022-12-18 DIAGNOSIS — R69 Illness, unspecified: Secondary | ICD-10-CM | POA: Diagnosis not present

## 2022-12-21 DIAGNOSIS — R69 Illness, unspecified: Secondary | ICD-10-CM | POA: Diagnosis not present

## 2022-12-23 DIAGNOSIS — R69 Illness, unspecified: Secondary | ICD-10-CM | POA: Diagnosis not present

## 2022-12-24 DIAGNOSIS — R69 Illness, unspecified: Secondary | ICD-10-CM | POA: Diagnosis not present

## 2022-12-25 DIAGNOSIS — R69 Illness, unspecified: Secondary | ICD-10-CM | POA: Diagnosis not present

## 2023-01-01 DIAGNOSIS — R69 Illness, unspecified: Secondary | ICD-10-CM | POA: Diagnosis not present

## 2023-01-04 DIAGNOSIS — R69 Illness, unspecified: Secondary | ICD-10-CM | POA: Diagnosis not present

## 2023-01-08 ENCOUNTER — Encounter: Payer: Self-pay | Admitting: Internal Medicine

## 2023-01-08 DIAGNOSIS — N401 Enlarged prostate with lower urinary tract symptoms: Secondary | ICD-10-CM

## 2023-01-08 MED ORDER — TAMSULOSIN HCL 0.4 MG PO CAPS: 0.4000 mg | ORAL_CAPSULE | Freq: Every day | ORAL | 3 refills | Status: DC

## 2023-01-08 NOTE — Telephone Encounter (Signed)
See below

## 2023-01-08 NOTE — Telephone Encounter (Signed)
Ok to watch for now, but I have also referred to urology

## 2023-01-08 NOTE — Telephone Encounter (Signed)
Ok for flomax 0.4 qd - done erx  Also ok for urology referral -

## 2023-01-26 DIAGNOSIS — R69 Illness, unspecified: Secondary | ICD-10-CM | POA: Diagnosis not present

## 2023-02-09 DIAGNOSIS — L4050 Arthropathic psoriasis, unspecified: Secondary | ICD-10-CM | POA: Diagnosis not present

## 2023-02-09 DIAGNOSIS — E663 Overweight: Secondary | ICD-10-CM | POA: Diagnosis not present

## 2023-02-09 DIAGNOSIS — Z6828 Body mass index (BMI) 28.0-28.9, adult: Secondary | ICD-10-CM | POA: Diagnosis not present

## 2023-02-09 DIAGNOSIS — Z79899 Other long term (current) drug therapy: Secondary | ICD-10-CM | POA: Diagnosis not present

## 2023-03-25 ENCOUNTER — Encounter (INDEPENDENT_AMBULATORY_CARE_PROVIDER_SITE_OTHER): Payer: Self-pay

## 2023-04-16 ENCOUNTER — Encounter: Payer: Medicare HMO | Admitting: Internal Medicine

## 2023-04-19 ENCOUNTER — Encounter: Payer: Self-pay | Admitting: Internal Medicine

## 2023-04-19 DIAGNOSIS — N489 Disorder of penis, unspecified: Secondary | ICD-10-CM

## 2023-04-21 ENCOUNTER — Encounter: Payer: Medicare HMO | Admitting: Internal Medicine

## 2023-04-26 ENCOUNTER — Encounter: Payer: Self-pay | Admitting: Internal Medicine

## 2023-04-29 ENCOUNTER — Encounter: Payer: Medicare HMO | Admitting: Internal Medicine

## 2023-05-03 ENCOUNTER — Telehealth: Payer: Self-pay | Admitting: Internal Medicine

## 2023-05-03 NOTE — Telephone Encounter (Signed)
Pt has to come in for an appt per urology office, Pt is scheduled on 05/07/23.

## 2023-05-03 NOTE — Telephone Encounter (Signed)
Questions about,  Referral # Creation Date Referral Status Status Update   [id number]     Please contact pt at your earliest convenience regarding the status of his referral " its been since MAY".Marland Kitchen

## 2023-05-07 ENCOUNTER — Ambulatory Visit (INDEPENDENT_AMBULATORY_CARE_PROVIDER_SITE_OTHER): Payer: Medicare HMO | Admitting: Internal Medicine

## 2023-05-07 ENCOUNTER — Encounter: Payer: Self-pay | Admitting: Internal Medicine

## 2023-05-07 VITALS — BP 120/72 | HR 70 | Temp 98.2°F | Ht 67.0 in | Wt 177.0 lb

## 2023-05-07 DIAGNOSIS — R3916 Straining to void: Secondary | ICD-10-CM

## 2023-05-07 DIAGNOSIS — E559 Vitamin D deficiency, unspecified: Secondary | ICD-10-CM

## 2023-05-07 DIAGNOSIS — I2510 Atherosclerotic heart disease of native coronary artery without angina pectoris: Secondary | ICD-10-CM | POA: Diagnosis not present

## 2023-05-07 DIAGNOSIS — Z125 Encounter for screening for malignant neoplasm of prostate: Secondary | ICD-10-CM | POA: Diagnosis not present

## 2023-05-07 DIAGNOSIS — N401 Enlarged prostate with lower urinary tract symptoms: Secondary | ICD-10-CM

## 2023-05-07 DIAGNOSIS — R739 Hyperglycemia, unspecified: Secondary | ICD-10-CM | POA: Diagnosis not present

## 2023-05-07 DIAGNOSIS — Z0000 Encounter for general adult medical examination without abnormal findings: Secondary | ICD-10-CM | POA: Diagnosis not present

## 2023-05-07 DIAGNOSIS — E7800 Pure hypercholesterolemia, unspecified: Secondary | ICD-10-CM

## 2023-05-07 DIAGNOSIS — Z0001 Encounter for general adult medical examination with abnormal findings: Secondary | ICD-10-CM

## 2023-05-07 DIAGNOSIS — R030 Elevated blood-pressure reading, without diagnosis of hypertension: Secondary | ICD-10-CM

## 2023-05-07 DIAGNOSIS — E538 Deficiency of other specified B group vitamins: Secondary | ICD-10-CM

## 2023-05-07 DIAGNOSIS — I700 Atherosclerosis of aorta: Secondary | ICD-10-CM

## 2023-05-07 DIAGNOSIS — Z23 Encounter for immunization: Secondary | ICD-10-CM

## 2023-05-07 DIAGNOSIS — N489 Disorder of penis, unspecified: Secondary | ICD-10-CM

## 2023-05-07 LAB — CBC WITH DIFFERENTIAL/PLATELET
Basophils Absolute: 0.1 10*3/uL (ref 0.0–0.1)
Basophils Relative: 0.6 % (ref 0.0–3.0)
Eosinophils Absolute: 0.1 10*3/uL (ref 0.0–0.7)
Eosinophils Relative: 1.3 % (ref 0.0–5.0)
HCT: 50.2 % (ref 39.0–52.0)
Hemoglobin: 16.3 g/dL (ref 13.0–17.0)
Lymphocytes Relative: 23.2 % (ref 12.0–46.0)
Lymphs Abs: 2 10*3/uL (ref 0.7–4.0)
MCHC: 32.5 g/dL (ref 30.0–36.0)
MCV: 99.1 fL (ref 78.0–100.0)
Monocytes Absolute: 0.7 10*3/uL (ref 0.1–1.0)
Monocytes Relative: 8.2 % (ref 3.0–12.0)
Neutro Abs: 5.7 10*3/uL (ref 1.4–7.7)
Neutrophils Relative %: 66.7 % (ref 43.0–77.0)
Platelets: 246 10*3/uL (ref 150.0–400.0)
RBC: 5.07 Mil/uL (ref 4.22–5.81)
RDW: 13.4 % (ref 11.5–15.5)
WBC: 8.6 10*3/uL (ref 4.0–10.5)

## 2023-05-07 LAB — LIPID PANEL
Cholesterol: 125 mg/dL (ref 0–200)
HDL: 47.6 mg/dL (ref >39.00)
LDL Cholesterol: 58 mg/dL (ref 0–99)
NonHDL: 76.97
Total CHOL/HDL Ratio: 3
Triglycerides: 94 mg/dL (ref 0.0–149.0)
VLDL: 18.8 mg/dL (ref 0.0–40.0)

## 2023-05-07 LAB — URINALYSIS, ROUTINE W REFLEX MICROSCOPIC
Bilirubin Urine: NEGATIVE
Hgb urine dipstick: NEGATIVE
Ketones, ur: NEGATIVE
Leukocytes,Ua: NEGATIVE
Nitrite: NEGATIVE
RBC / HPF: NONE SEEN (ref 0–?)
Specific Gravity, Urine: 1.015 (ref 1.000–1.030)
Total Protein, Urine: NEGATIVE
Urine Glucose: NEGATIVE
Urobilinogen, UA: 1 (ref 0.0–1.0)
pH: 7 (ref 5.0–8.0)

## 2023-05-07 LAB — BASIC METABOLIC PANEL
BUN: 18 mg/dL (ref 6–23)
CO2: 30 meq/L (ref 19–32)
Calcium: 9.4 mg/dL (ref 8.4–10.5)
Chloride: 103 meq/L (ref 96–112)
Creatinine, Ser: 1.04 mg/dL (ref 0.40–1.50)
GFR: 69.51 mL/min (ref >60.00)
Glucose, Bld: 97 mg/dL (ref 70–99)
Potassium: 4.4 meq/L (ref 3.5–5.1)
Sodium: 140 meq/L (ref 135–145)

## 2023-05-07 LAB — HEPATIC FUNCTION PANEL
ALT: 15 U/L (ref 0–53)
AST: 17 U/L (ref 0–37)
Albumin: 4.2 g/dL (ref 3.5–5.2)
Alkaline Phosphatase: 64 U/L (ref 39–117)
Bilirubin, Direct: 0.2 mg/dL (ref 0.0–0.3)
Total Bilirubin: 0.9 mg/dL (ref 0.2–1.2)
Total Protein: 6.6 g/dL (ref 6.0–8.3)

## 2023-05-07 LAB — VITAMIN B12: Vitamin B-12: 503 pg/mL (ref 211–911)

## 2023-05-07 LAB — HEMOGLOBIN A1C: Hgb A1c MFr Bld: 5.5 % (ref 4.6–6.5)

## 2023-05-07 LAB — MICROALBUMIN / CREATININE URINE RATIO
Creatinine,U: 126.5 mg/dL
Microalb Creat Ratio: 0.6 mg/g (ref 0.0–30.0)
Microalb, Ur: <0.7 mg/dL (ref 0.0–1.9)

## 2023-05-07 LAB — VITAMIN D 25 HYDROXY (VIT D DEFICIENCY, FRACTURES): VITD: 55.25 ng/mL (ref 30.00–100.00)

## 2023-05-07 LAB — TSH: TSH: 1.42 u[IU]/mL (ref 0.35–5.50)

## 2023-05-07 LAB — PSA: PSA: 1.51 ng/mL (ref 0.10–4.00)

## 2023-05-07 MED ORDER — EZETIMIBE 10 MG PO TABS: 10.0000 mg | ORAL_TABLET | Freq: Every day | ORAL | 3 refills | Status: DC

## 2023-05-07 MED ORDER — TAMSULOSIN HCL 0.4 MG PO CAPS: 0.4000 mg | ORAL_CAPSULE | Freq: Every day | ORAL | 3 refills | Status: DC

## 2023-05-07 MED ORDER — PRAVASTATIN SODIUM 80 MG PO TABS: 80.0000 mg | ORAL_TABLET | Freq: Every evening | ORAL | 3 refills | Status: DC

## 2023-05-07 NOTE — Progress Notes (Unsigned)
Patient ID: Jason Hart, male   DOB: [birth year], 77 y.o.   MRN: [id number]         Chief Complaint:: wellness exam and hyperglyecmia, hld, cad, aortic atherosclerosis, hx of elevated BP without htn       HPI:  Jason Hart is a 77 y.o. male here for wellness exam; for shingrix and tdap at pharmacy, declines covid booster, o/w up to date                        Also Pt denies chest pain, increased sob or doe, wheezing, orthopnea, PND, increased LE swelling, palpitations, dizziness or syncope.   Pt denies polydipsia, polyuria, or new focal neuro s/s.    Pt denies fever, wt loss, night sweats, loss of appetite, or other constitutional symptoms     Wt Readings from Last 3 Encounters:  05/07/23 177 lb (80.3 kg)  09/23/22 175 lb 9.6 oz (79.7 kg)  08/26/22 170 lb (77.1 kg)   BP Readings from Last 3 Encounters:  05/07/23 120/72  09/23/22 118/68  08/26/22 124/78   Immunization History  Administered Date(s) Administered   Fluad Quad(high Dose 65+) 05/08/2019, 04/10/2021, 04/18/2022   Fluad Trivalent(High Dose 65+) 05/07/2023   Influenza, High Dose Seasonal PF 05/28/2016, 05/01/2020   PFIZER Comirnaty(Gray Top)Covid-19 Tri-Sucrose Vaccine 05/22/2022   PFIZER(Purple Top)SARS-COV-2 Vaccination 09/01/2019, 09/19/2019, 04/23/2020, 08/23/2020   Pfizer Covid-19 Vaccine Bivalent Booster 58yrs & up 05/05/2021   Pneumococcal Conjugate-13 02/24/2016   Pneumococcal Polysaccharide-23 03/16/2017   Tetanus 02/14/2013   Zoster Recombinant(Shingrix) 01/08/2022   Zoster, Live 09/09/2015   Health Maintenance Due  Topic Date Due   DTaP/Tdap/Td (1 - Tdap) 02/15/2013   Zoster Vaccines- Shingrix (2 of 2) 03/05/2022   Medicare Annual Wellness (AWV)  06/26/2023      Past Medical History:  Diagnosis Date   Arthritis    BPH (benign prostatic hypertrophy)    CAD (coronary artery disease), native coronary artery    Coronary CTA showed coronary calcium score 316 with 25 to 49% stenosis throughout  the RCA, small patent ramus, 50 to 69% mid LAD proximal to the takeoff of D2 followed by 25 to 49% stenosis on 09/04/2020 with FFR showing no focal stenosis.   Conductive hearing loss    Hyperlipidemia    Psoriatic arthritis (HCC)    Trigger point with temporomandibular joint pain    Past Surgical History:  Procedure Laterality Date   COLONOSCOPY  2006   Negative;due 2016; [facility]   TONSILLECTOMY      reports that he has never smoked. He has never used smokeless tobacco. He reports that he does not drink alcohol and does not use drugs. family history includes Benign prostatic hyperplasia in his brother; CAD in his brother and brother; Coronary artery disease (age of onset: 82) in his brother; Heart attack (age of onset: 67) in his father; Heart attack (age of onset: 6) in his paternal grandfather; Hyperlipidemia in his father; Prostate cancer in his brother; Sudden death in his father; Throat cancer in his mother. No Known Allergies Current Outpatient Medications on File Prior to Visit  Medication Sig Dispense Refill   aspirin EC 81 MG tablet Take 1 tablet (81 mg total) by mouth daily. Swallow whole. 90 tablet 3   folic acid (FOLVITE) 1 MG tablet Take 1 tablet (1 mg total) by mouth daily. 90 tablet 3   methotrexate (RHEUMATREX) 2.5 MG tablet Take 2.5 mg by mouth once a week. Caution:Chemotherapy.  Protect from light.  4 tablets     No current facility-administered medications on file prior to visit.        ROS:  All others reviewed and negative.  Objective        PE:  BP 120/72 (BP Location: Right Arm, Patient Position: Sitting, Cuff Size: Normal)   Pulse 70   Temp 98.2 F (36.8 C) (Oral)   Ht 5\' 7"  (1.702 m)   Wt 177 lb (80.3 kg)   SpO2 96%   BMI 27.72 kg/m                 Constitutional: Pt appears in NAD               HENT: Head: NCAT.                Right Ear: External ear normal.                 Left Ear: External ear normal.                Eyes: . Pupils are equal,  round, and reactive to light. Conjunctivae and EOM are normal               Nose: without d/c or deformity               Neck: Neck supple. Gross normal ROM               Cardiovascular: Normal rate and regular rhythm.                 Pulmonary/Chest: Effort normal and breath sounds without rales or wheezing.                Abd:  Soft, NT, ND, + BS, no organomegaly               Neurological: Pt is alert. At baseline orientation, motor grossly intact               Skin: Skin is warm. No rashes, no other new lesions, LE edema - none               Psychiatric: Pt behavior is normal without agitation   Micro: none  Cardiac tracings I have personally interpreted today:  none  Pertinent Radiological findings (summarize): none   Lab Results  Component Value Date   WBC 8.6 05/07/2023   HGB 16.3 05/07/2023   HCT 50.2 05/07/2023   PLT 246.0 05/07/2023   GLUCOSE 97 05/07/2023   CHOL 125 05/07/2023   TRIG 94.0 05/07/2023   HDL 47.60 05/07/2023   LDLDIRECT 137.3 10/25/2009   LDLCALC 58 05/07/2023   ALT 15 05/07/2023   AST 17 05/07/2023   NA 140 05/07/2023   K 4.4 05/07/2023   CL 103 05/07/2023   CREATININE 1.04 05/07/2023   BUN 18 05/07/2023   CO2 30 05/07/2023   TSH 1.42 05/07/2023   PSA 1.51 05/07/2023   INR 1.2 02/23/2020   HGBA1C 5.5 05/07/2023   MICROALBUR <0.7 05/07/2023   Assessment/Plan:  Jason Hart is a 77 y.o. White or Caucasian [1] male with  has a past medical history of Arthritis, BPH (benign prostatic hypertrophy), CAD (coronary artery disease), native coronary artery, Conductive hearing loss, Hyperlipidemia, Psoriatic arthritis (HCC), and Trigger point with temporomandibular joint pain.  Encounter for well adult exam with abnormal findings Age and sex appropriate education and counseling updated with regular exercise and diet Referrals for  preventative services - none needed Immunizations addressed - declines covid booster, for shingrix and tdap at  pharmacy Smoking counseling  - none needed Evidence for depression or other mood disorder - none significant Most recent labs reviewed. I have personally reviewed and have noted: 1) the patient's medical and social history 2) The patient's current medications and supplements 3) The patient's height, weight, and BMI have been recorded in the chart   Elevated blood pressure reading without diagnosis of hypertension BP Readings from Last 3 Encounters:  05/07/23 120/72  09/23/22 118/68  08/26/22 124/78   Stable, pt to continue medical treatment  - diet, wt control  Hyperglycemia Lab Results  Component Value Date   HGBA1C 5.5 05/07/2023   Stable, pt to continue current medical treatment  - diet, wt control   Hyperlipidemia Lab Results  Component Value Date   LDLCALC 58 05/07/2023   Stable, pt to continue current statin pravachol 80 mg qd   Aortic atherosclerosis (HCC) Pt to continue diet, exercise, and pravachol 80 mg qd  CAD (coronary artery disease), native coronary artery Pt to continue diet, exercise, and pravachol 80 mg qd  Benign prostatic hyperplasia (BPH) with straining on urination Stable, cont flomax 0.4 mg qd  Followup: Return in about 1 year (around 05/06/2024).  Oliver Barre, MD 05/09/2023 4:07 PM [facility]

## 2023-05-07 NOTE — Progress Notes (Signed)
The test results show that your current treatment is OK, as the tests are stable.  Please continue the same plan.  There is no other need for change of treatment or further evaluation based on these results, at this time.  thanks 

## 2023-05-07 NOTE — Patient Instructions (Addendum)
Please have your Shingrix (shingles) shots done at your local pharmacy  Please continue all other medications as before, and refills have been done if requested.  Please have the pharmacy call with any other refills you may need.  Please continue your efforts at being more active, low cholesterol diet, and weight control.  You are otherwise up to date with prevention measures today.  Please keep your appointments with your specialists as you may have planned -  You will be contacted regarding the referral for: urology as discussed  Please go to the LAB at the blood drawing area for the tests to be done  You will be contacted by phone if any changes need to be made immediately.  Otherwise, you will receive a letter about your results with an explanation, but please check with MyChart first.  Please make an Appointment to return for your 1 year visit, or sooner if needed

## 2023-05-09 ENCOUNTER — Encounter: Payer: Self-pay | Admitting: Internal Medicine

## 2023-05-09 NOTE — Assessment & Plan Note (Signed)
Lab Results  Component Value Date   LDLCALC 58 05/07/2023   Stable, pt to continue current statin pravachol 80 mg qd

## 2023-05-09 NOTE — Assessment & Plan Note (Signed)
Age and sex appropriate education and counseling updated with regular exercise and diet Referrals for preventative services - none needed Immunizations addressed - declines covid booster, for shingrix and tdap at pharmacy Smoking counseling  - none needed Evidence for depression or other mood disorder - none significant Most recent labs reviewed. I have personally reviewed and have noted: 1) the patient's medical and social history 2) The patient's current medications and supplements 3) The patient's height, weight, and BMI have been recorded in the chart

## 2023-05-09 NOTE — Assessment & Plan Note (Signed)
Lab Results  Component Value Date   HGBA1C 5.5 05/07/2023   Stable, pt to continue current medical treatment  - diet, wt control

## 2023-05-09 NOTE — Assessment & Plan Note (Signed)
Pt to continue diet, exercise, and pravachol 80 mg qd

## 2023-05-09 NOTE — Assessment & Plan Note (Signed)
Stable, cont flomax 0.4 mg qd

## 2023-05-09 NOTE — Assessment & Plan Note (Signed)
BP Readings from Last 3 Encounters:  05/07/23 120/72  09/23/22 118/68  08/26/22 124/78   Stable, pt to continue medical treatment  - diet, wt control

## 2023-05-11 DIAGNOSIS — L4050 Arthropathic psoriasis, unspecified: Secondary | ICD-10-CM | POA: Diagnosis not present

## 2023-05-19 ENCOUNTER — Ambulatory Visit: Payer: Medicare HMO | Admitting: Urology

## 2023-05-19 ENCOUNTER — Encounter: Payer: Self-pay | Admitting: Urology

## 2023-05-19 VITALS — BP 127/74 | HR 71 | Ht 67.0 in | Wt 175.0 lb

## 2023-05-19 DIAGNOSIS — N481 Balanitis: Secondary | ICD-10-CM

## 2023-05-19 MED ORDER — CLOTRIMAZOLE-BETAMETHASONE 1-0.05 % EX CREA: 1.0000 | TOPICAL_CREAM | Freq: Two times a day (BID) | CUTANEOUS | 0 refills | Status: AC

## 2023-05-19 NOTE — Progress Notes (Signed)
Assessment: 1. Balanitis      Plan: Today discussed local measures including using a hypoallergenic soap for bathing and have also recommended a short course of Lotrisone cream.  Chief Complaint: penile lesion  History of Present Illness:  Jason Hart is a 77 y.o. male with a past medical history of BPH (benign prostatic hypertrophy), CAD (coronary artery disease), native coronary artery, Conductive hearing loss, Hyperlipidemia, Psoriatic arthritis (HCC who is seen in consultation from Corwin Levins, MD for evaluation of penile skin lesion. Patient reports that he developed some redness and sensitivity of the ventral penile skin on the distal shaft last May and it took several months but it is now mostly cleared.  Indeed on exam he does have some mild dry looking skin on the distal shaft ventrally.  Is currently not a bother.  Patient has had routine PSA screening which has been normal. PSA 04/2023 = 1.51 Patient does have longstanding BPH/lower urinary tract symptoms and is on tamsulosin with stable mild to moderate LUTS.  Past Medical History:  Past Medical History:  Diagnosis Date   Arthritis    BPH (benign prostatic hypertrophy)    CAD (coronary artery disease), native coronary artery    Coronary CTA showed coronary calcium score 316 with 25 to 49% stenosis throughout the RCA, small patent ramus, 50 to 69% mid LAD proximal to the takeoff of D2 followed by 25 to 49% stenosis on 09/04/2020 with FFR showing no focal stenosis.   Conductive hearing loss    Hyperlipidemia    Psoriatic arthritis (HCC)    Trigger point with temporomandibular joint pain     Past Surgical History:  Past Surgical History:  Procedure Laterality Date   COLONOSCOPY  2006   Negative;due 2016; [facility]   TONSILLECTOMY      Allergies:  No Known Allergies  Family History:  Family History  Problem Relation Age of Onset   Throat cancer Mother        smoker   Hyperlipidemia Father    Heart  attack Father 52   Sudden death Father    Heart attack Paternal Grandfather 39   Coronary artery disease Brother 37       stent   CAD Brother        stent   Prostate cancer Brother    Benign prostatic hyperplasia Brother        TURP   CAD Brother        stent   Hypertension Neg Hx    Diabetes Neg Hx    Stroke Neg Hx    Colon cancer Neg Hx    Colon polyps Neg Hx    Rectal cancer Neg Hx    Stomach cancer Neg Hx     Social History:  Social History   Tobacco Use   Smoking status: Never   Smokeless tobacco: Never  Vaping Use   Vaping status: Never Used  Substance Use Topics   Alcohol use: No    Alcohol/week: 0.0 standard drinks of alcohol    Comment: not since MTX started; occasional   Drug use: No    Review of symptoms:  Constitutional:  Negative for unexplained weight loss, night sweats, fever, chills ENT:  Negative for nose bleeds, sinus pain, painful swallowing CV:  Negative for chest pain, shortness of breath, exercise intolerance, palpitations, loss of consciousness Resp:  Negative for cough, wheezing, shortness of breath GI:  Negative for nausea, vomiting, diarrhea, bloody stools GU:  Positives noted in  HPI; otherwise negative for gross hematuria, dysuria, urinary incontinence Neuro:  Negative for seizures, poor balance, limb weakness, slurred speech Psych:  Negative for lack of energy, depression, anxiety Endocrine:  Negative for polydipsia, polyuria, symptoms of hypoglycemia (dizziness, hunger, sweating) Hematologic:  Negative for anemia, purpura, petechia, prolonged or excessive bleeding, use of anticoagulants  Allergic:  Negative for difficulty breathing or choking as a result of exposure to anything; no shellfish allergy; no allergic response (rash/itch) to materials, foods  Physical exam: BP 127/74   Pulse 71   Ht 5\' 7"  (1.702 m)   Wt 175 lb (79.4 kg)   BMI 27.41 kg/m  GENERAL APPEARANCE:  Well appearing, well developed, well nourished, NAD GU:  Circumcised phallus with some mild skin excoriation ventrally distal penile shaft.

## 2023-05-30 ENCOUNTER — Encounter: Payer: Self-pay | Admitting: Internal Medicine

## 2023-05-30 DIAGNOSIS — R21 Rash and other nonspecific skin eruption: Secondary | ICD-10-CM

## 2023-06-03 DIAGNOSIS — E785 Hyperlipidemia, unspecified: Secondary | ICD-10-CM | POA: Diagnosis not present

## 2023-06-03 DIAGNOSIS — Z8616 Personal history of COVID-19: Secondary | ICD-10-CM | POA: Diagnosis not present

## 2023-06-03 DIAGNOSIS — N182 Chronic kidney disease, stage 2 (mild): Secondary | ICD-10-CM | POA: Diagnosis not present

## 2023-06-03 DIAGNOSIS — R030 Elevated blood-pressure reading, without diagnosis of hypertension: Secondary | ICD-10-CM | POA: Diagnosis not present

## 2023-06-03 DIAGNOSIS — M069 Rheumatoid arthritis, unspecified: Secondary | ICD-10-CM | POA: Diagnosis not present

## 2023-06-03 DIAGNOSIS — L4050 Arthropathic psoriasis, unspecified: Secondary | ICD-10-CM | POA: Diagnosis not present

## 2023-06-03 DIAGNOSIS — Z79631 Long term (current) use of antimetabolite agent: Secondary | ICD-10-CM | POA: Diagnosis not present

## 2023-06-29 DIAGNOSIS — L304 Erythema intertrigo: Secondary | ICD-10-CM | POA: Diagnosis not present

## 2023-08-17 DIAGNOSIS — E663 Overweight: Secondary | ICD-10-CM | POA: Diagnosis not present

## 2023-08-17 DIAGNOSIS — Z79899 Other long term (current) drug therapy: Secondary | ICD-10-CM | POA: Diagnosis not present

## 2023-08-17 DIAGNOSIS — Z6828 Body mass index (BMI) 28.0-28.9, adult: Secondary | ICD-10-CM | POA: Diagnosis not present

## 2023-08-17 DIAGNOSIS — L4050 Arthropathic psoriasis, unspecified: Secondary | ICD-10-CM | POA: Diagnosis not present

## 2023-09-01 DIAGNOSIS — D225 Melanocytic nevi of trunk: Secondary | ICD-10-CM | POA: Diagnosis not present

## 2023-09-01 DIAGNOSIS — Z1283 Encounter for screening for malignant neoplasm of skin: Secondary | ICD-10-CM | POA: Diagnosis not present

## 2023-09-01 DIAGNOSIS — L308 Other specified dermatitis: Secondary | ICD-10-CM | POA: Diagnosis not present

## 2023-09-10 DIAGNOSIS — H2513 Age-related nuclear cataract, bilateral: Secondary | ICD-10-CM | POA: Diagnosis not present

## 2023-09-10 DIAGNOSIS — H524 Presbyopia: Secondary | ICD-10-CM | POA: Diagnosis not present

## 2023-09-10 DIAGNOSIS — H353131 Nonexudative age-related macular degeneration, bilateral, early dry stage: Secondary | ICD-10-CM | POA: Diagnosis not present

## 2023-09-22 DIAGNOSIS — H5213 Myopia, bilateral: Secondary | ICD-10-CM | POA: Diagnosis not present

## 2023-09-22 DIAGNOSIS — Z0100 Encounter for examination of eyes and vision without abnormal findings: Secondary | ICD-10-CM | POA: Diagnosis not present

## 2023-09-22 DIAGNOSIS — H524 Presbyopia: Secondary | ICD-10-CM | POA: Diagnosis not present

## 2023-11-16 DIAGNOSIS — L4050 Arthropathic psoriasis, unspecified: Secondary | ICD-10-CM | POA: Diagnosis not present

## 2023-12-06 ENCOUNTER — Telehealth: Payer: Self-pay

## 2023-12-06 ENCOUNTER — Ambulatory Visit (INDEPENDENT_AMBULATORY_CARE_PROVIDER_SITE_OTHER): Admitting: Internal Medicine

## 2023-12-06 ENCOUNTER — Encounter: Payer: Self-pay | Admitting: Internal Medicine

## 2023-12-06 ENCOUNTER — Other Ambulatory Visit (HOSPITAL_COMMUNITY): Payer: Self-pay

## 2023-12-06 VITALS — BP 126/78 | HR 62 | Temp 98.0°F | Ht 67.0 in | Wt 177.0 lb

## 2023-12-06 DIAGNOSIS — E559 Vitamin D deficiency, unspecified: Secondary | ICD-10-CM

## 2023-12-06 DIAGNOSIS — E7800 Pure hypercholesterolemia, unspecified: Secondary | ICD-10-CM | POA: Diagnosis not present

## 2023-12-06 DIAGNOSIS — R739 Hyperglycemia, unspecified: Secondary | ICD-10-CM | POA: Diagnosis not present

## 2023-12-06 DIAGNOSIS — G6289 Other specified polyneuropathies: Secondary | ICD-10-CM | POA: Diagnosis not present

## 2023-12-06 DIAGNOSIS — E538 Deficiency of other specified B group vitamins: Secondary | ICD-10-CM

## 2023-12-06 DIAGNOSIS — Z125 Encounter for screening for malignant neoplasm of prostate: Secondary | ICD-10-CM

## 2023-12-06 DIAGNOSIS — G629 Polyneuropathy, unspecified: Secondary | ICD-10-CM | POA: Insufficient documentation

## 2023-12-06 MED ORDER — AMITRIPTYLINE HCL 100 MG PO TABS: ORAL_TABLET | ORAL | 1 refills | Status: DC

## 2023-12-06 NOTE — Patient Instructions (Addendum)
 Please have your Shingrix (shingles) shots done at your local pharmacyy, and Tdap shots as well  Please take all new medication as prescribed - the Elavil as needed at bedtime  Please continue all other medications as before, and refills have been done if requested.  Please have the pharmacy call with any other refills you may need.  Please keep your appointments with your specialists as you may have planned  Please make an Appointment to return in Sept 2025, or sooner if needed, also with Lab Appointment for testing done 3-5 days before at the FIRST FLOOR Lab (so this is for TWO appointments - please see the scheduling desk as you leave)

## 2023-12-06 NOTE — Telephone Encounter (Signed)
 Pharmacy Patient Advocate Encounter   Received notification from CoverMyMeds that prior authorization for Amitriptyline HCl 100MG  tablets is required/requested.   Insurance verification completed.   The patient is insured through [facility] .   Per test claim: PA required; PA submitted to above mentioned insurance via CoverMyMeds Key/confirmation #/EOC [id number] Status is pending

## 2023-12-06 NOTE — Telephone Encounter (Signed)
 Pharmacy Patient Advocate Encounter  Received notification from [facility] that Prior Authorization for AMITRIPTYLINE 100MG  TABS has been APPROVED from 08/11/2023 to 08/09/2024. Ran test claim, Copay is $2.57/ 90 DAY SUPPLY. This test claim was processed through [facility]- copay amounts may vary at other pharmacies due to pharmacy/plan contracts, or as the patient moves through the different stages of their insurance plan.   PA #/Case ID/Reference #: [id number]

## 2023-12-06 NOTE — Assessment & Plan Note (Signed)
Lab Results  Component Value Date   HGBA1C 5.5 05/07/2023   Stable, pt to continue current medical treatment  - diet, wt control

## 2023-12-06 NOTE — Assessment & Plan Note (Signed)
Lab Results  Component Value Date   LDLCALC 58 05/07/2023   Stable, pt to continue current statin pravachol 80 mg qd

## 2023-12-06 NOTE — Progress Notes (Signed)
 Patient ID: Jason Hart, male   DOB: September 09, 1945, 78 y.o.   MRN: [id number]        Chief Complaint: follow up tingling pain to feet at bedtime, hyperglycemia, hld       HPI:  Jason Hart is a 78 y.o. male here with c/o above , wrosening x 1 month, hard to get to sleep at nght.  Jason Hart denies chest pain, increased sob or doe, wheezing, orthopnea, PND, increased LE swelling, palpitations, dizziness or syncope.   Jason Hart denies polydipsia, polyuria, or new focal neuro s/s.    Jason Hart denies fever, wt loss, night sweats, loss of appetite, or other constitutional symptoms         Wt Readings from Last 3 Encounters:  12/06/23 177 lb (80.3 kg)  05/19/23 175 lb (79.4 kg)  05/07/23 177 lb (80.3 kg)   BP Readings from Last 3 Encounters:  12/06/23 126/78  05/19/23 127/74  05/07/23 120/72         Past Medical History:  Diagnosis Date   Arthritis    BPH (benign prostatic hypertrophy)    CAD (coronary artery disease), native coronary artery    Coronary CTA showed coronary calcium score 316 with 25 to 49% stenosis throughout the RCA, small patent ramus, 50 to 69% mid LAD proximal to the takeoff of D2 followed by 25 to 49% stenosis on 09/04/2020 with FFR showing no focal stenosis.   Conductive hearing loss    Hyperlipidemia    Psoriatic arthritis (HCC)    Trigger point with temporomandibular joint pain    Past Surgical History:  Procedure Laterality Date   COLONOSCOPY  2006   Negative;due 2016; [facility]   TONSILLECTOMY      reports that he has never smoked. He has never used smokeless tobacco. He reports that he does not drink alcohol and does not use drugs. family history includes Benign prostatic hyperplasia in his brother; CAD in his brother and brother; Coronary artery disease (age of onset: 88) in his brother; Heart attack (age of onset: 41) in his father; Heart attack (age of onset: 69) in his paternal grandfather; Hyperlipidemia in his father; Prostate cancer in his brother; Sudden death  in his father; Throat cancer in his mother. No Known Allergies Current Outpatient Medications on File Prior to Visit  Medication Sig Dispense Refill   aspirin  EC 81 MG tablet Take 1 tablet (81 mg total) by mouth daily. Swallow whole. 90 tablet 3   ezetimibe  (ZETIA ) 10 MG tablet Take 1 tablet (10 mg total) by mouth daily. 90 tablet 3   fluticasone (CUTIVATE) 0.05 % cream Apply 1 Application topically daily.     folic acid  (FOLVITE ) 1 MG tablet Take 1 tablet (1 mg total) by mouth daily. 90 tablet 3   methotrexate (RHEUMATREX) 2.5 MG tablet Take 2.5 mg by mouth once a week. Caution:Chemotherapy. Protect from light.  4 tablets     pravastatin  (PRAVACHOL ) 80 MG tablet Take 1 tablet (80 mg total) by mouth every evening. 90 tablet 3   tamsulosin  (FLOMAX ) 0.4 MG CAPS capsule Take 1 capsule (0.4 mg total) by mouth daily. 90 capsule 3   valACYclovir (VALTREX) 1000 MG tablet Take 1,000 mg by mouth 2 (two) times daily.     No current facility-administered medications on file prior to visit.        ROS:  All others reviewed and negative.  Objective        PE:  BP 126/78 (BP Location: Right Arm, Patient  Position: Sitting, Cuff Size: Normal)   Pulse 62   Temp 98 F (36.7 C) (Oral)   Ht 5\' 7"  (1.702 m)   Wt 177 lb (80.3 kg)   SpO2 99%   BMI 27.72 kg/m                 Constitutional: Jason Hart appears in NAD               HENT: Head: NCAT.                Right Ear: External ear normal.                 Left Ear: External ear normal.                Eyes: . Pupils are equal, round, and reactive to light. Conjunctivae and EOM are normal               Nose: without d/c or deformity               Neck: Neck supple. Gross normal ROM               Cardiovascular: Normal rate and regular rhythm.                 Pulmonary/Chest: Effort normal and breath sounds without rales or wheezing.                Abd:  Soft, NT, ND, + BS, no organomegaly               Neurological: Jason Hart is alert. At baseline orientation,  motor grossly intact               Skin: Skin is warm. No rashes, no other new lesions, LE edema - none               Psychiatric: Jason Hart behavior is normal without agitation   Micro: none  Cardiac tracings I have personally interpreted today:  none  Pertinent Radiological findings (summarize): none   Lab Results  Component Value Date   WBC 8.6 05/07/2023   HGB 16.3 05/07/2023   HCT 50.2 05/07/2023   PLT 246.0 05/07/2023   GLUCOSE 97 05/07/2023   CHOL 125 05/07/2023   TRIG 94.0 05/07/2023   HDL 47.60 05/07/2023   LDLDIRECT 137.3 10/25/2009   LDLCALC 58 05/07/2023   ALT 15 05/07/2023   AST 17 05/07/2023   NA 140 05/07/2023   K 4.4 05/07/2023   CL 103 05/07/2023   CREATININE 1.04 05/07/2023   BUN 18 05/07/2023   CO2 30 05/07/2023   TSH 1.42 05/07/2023   PSA 1.51 05/07/2023   INR 1.2 02/23/2020   HGBA1C 5.5 05/07/2023   MICROALBUR <0.7 05/07/2023   Assessment/Plan:  Jason Hart is a 78 y.o. White or Caucasian [1] male with  has a past medical history of Arthritis, BPH (benign prostatic hypertrophy), CAD (coronary artery disease), native coronary artery, Conductive hearing loss, Hyperlipidemia, Psoriatic arthritis (HCC), and Trigger point with temporomandibular joint pain.  Peripheral neuropathy With worsening mild to mod, for elavil 50 - 100 mg at bedtime prn  Hyperglycemia Lab Results  Component Value Date   HGBA1C 5.5 05/07/2023   Stable, Jason Hart to continue current medical treatment  - diet, wt control   Hyperlipidemia Lab Results  Component Value Date   LDLCALC 58 05/07/2023   Stable, Jason Hart to continue current statin pravachol  80 mg qd  Followup: Return in about  5 months (around 05/07/2024).  Rosalia Colonel, MD 12/06/2023 9:47 PM [facility]

## 2023-12-06 NOTE — Assessment & Plan Note (Signed)
 With worsening mild to mod, for elavil 50 - 100 mg at bedtime prn

## 2024-02-08 DIAGNOSIS — Z6827 Body mass index (BMI) 27.0-27.9, adult: Secondary | ICD-10-CM | POA: Diagnosis not present

## 2024-02-08 DIAGNOSIS — E663 Overweight: Secondary | ICD-10-CM | POA: Diagnosis not present

## 2024-02-08 DIAGNOSIS — L4050 Arthropathic psoriasis, unspecified: Secondary | ICD-10-CM | POA: Diagnosis not present

## 2024-02-08 DIAGNOSIS — Z79899 Other long term (current) drug therapy: Secondary | ICD-10-CM | POA: Diagnosis not present

## 2024-02-23 DIAGNOSIS — M25552 Pain in left hip: Secondary | ICD-10-CM | POA: Diagnosis not present

## 2024-02-23 DIAGNOSIS — M545 Low back pain, unspecified: Secondary | ICD-10-CM | POA: Diagnosis not present

## 2024-03-01 DIAGNOSIS — S76312D Strain of muscle, fascia and tendon of the posterior muscle group at thigh level, left thigh, subsequent encounter: Secondary | ICD-10-CM | POA: Diagnosis not present

## 2024-04-12 NOTE — Progress Notes (Signed)
 Assessment: Sebaceous cysts of scrotum, mild    Plan: I discussed with Jason Hart the fact that small sebaceous cysts are quite common on the scrotum.  At this point, I would not recommend excision.  He asked if it is okay for him to try to pop them with a needle.  I think that is fine as long as he uses proper sterile technique  He will come back as needed  Chief Complaint: penile lesion  History of Present Illness:  Jason Hart saw this gentleman a few months ago for penile irritation.  He was treated with Lotrisone  cream and improved.  He comes in now with a small cyst on his scrotum that are bothersome.  He thinks they have been there for a while.  Past Medical History:  Past Medical History:  Diagnosis Date   Arthritis    BPH (benign prostatic hypertrophy)    CAD (coronary artery disease), native coronary artery    Coronary CTA showed coronary calcium score 316 with 25 to 49% stenosis throughout the RCA, small patent ramus, 50 to 69% mid LAD proximal to the takeoff of D2 followed by 25 to 49% stenosis on 09/04/2020 with FFR showing no focal stenosis.   Conductive hearing loss    Hyperlipidemia    Psoriatic arthritis (HCC)    Trigger point with temporomandibular joint pain     Past Surgical History:  Past Surgical History:  Procedure Laterality Date   COLONOSCOPY  2006   Negative;due 2016; [facility]   TONSILLECTOMY      Allergies:  No Known Allergies  Family History:  Family History  Problem Relation Age of Onset   Throat cancer Mother        smoker   Hyperlipidemia Father    Heart attack Father 34   Sudden death Father    Heart attack Paternal Grandfather 35   Coronary artery disease Brother 55       stent   CAD Brother        stent   Prostate cancer Brother    Benign prostatic hyperplasia Brother        TURP   CAD Brother        stent   Hypertension Neg Hx    Diabetes Neg Hx    Stroke Neg Hx    Colon cancer Neg Hx    Colon polyps Neg Hx    Rectal  cancer Neg Hx    Stomach cancer Neg Hx     Social History:  Social History   Tobacco Use   Smoking status: Never   Smokeless tobacco: Never  Vaping Use   Vaping status: Never Used  Substance Use Topics   Alcohol use: No    Alcohol/week: 0.0 standard drinks of alcohol    Comment: not since MTX started; occasional   Drug use: No    Review of symptoms:  Constitutional:  Negative for unexplained weight loss, night sweats, fever, chills ENT:  Negative for nose bleeds, sinus pain, painful swallowing CV:  Negative for chest pain, shortness of breath, exercise intolerance, palpitations, loss of consciousness Resp:  Negative for cough, wheezing, shortness of breath GI:  Negative for nausea, vomiting, diarrhea, bloody stools GU:  Positives noted in HPI; otherwise negative for gross hematuria, dysuria, urinary incontinence Neuro:  Negative for seizures, poor balance, limb weakness, slurred speech Psych:  Negative for lack of energy, depression, anxiety Endocrine:  Negative for polydipsia, polyuria, symptoms of hypoglycemia (dizziness, hunger, sweating) Hematologic:  Negative for anemia,  purpura, petechia, prolonged or excessive bleeding, use of anticoagulants  Allergic:  Negative for difficulty breathing or choking as a result of exposure to anything; no shellfish allergy; no allergic response (rash/itch) to materials, foods  Physical exam: There were no vitals taken for this visit. GENERAL APPEARANCE:  Well appearing, well developed, well nourished, NAD GU: Penis is circumcised.  No lesions, no balanitis.  Scrotal skin normal except for 3 relatively small epidermoid cysts on the left side.

## 2024-04-13 ENCOUNTER — Ambulatory Visit: Admitting: Urology

## 2024-04-13 VITALS — BP 157/66 | HR 84 | Ht 67.0 in | Wt 165.0 lb

## 2024-04-13 DIAGNOSIS — L723 Sebaceous cyst: Secondary | ICD-10-CM | POA: Diagnosis not present

## 2024-04-13 DIAGNOSIS — N481 Balanitis: Secondary | ICD-10-CM

## 2024-04-13 DIAGNOSIS — L720 Epidermal cyst: Secondary | ICD-10-CM

## 2024-05-08 ENCOUNTER — Encounter: Payer: Self-pay | Admitting: Internal Medicine

## 2024-05-08 ENCOUNTER — Ambulatory Visit: Payer: Medicare HMO | Admitting: Internal Medicine

## 2024-05-08 ENCOUNTER — Ambulatory Visit (INDEPENDENT_AMBULATORY_CARE_PROVIDER_SITE_OTHER): Payer: Medicare HMO

## 2024-05-08 ENCOUNTER — Ambulatory Visit: Payer: Self-pay | Admitting: Internal Medicine

## 2024-05-08 VITALS — BP 138/78 | HR 58 | Temp 98.7°F | Ht 64.0 in | Wt 174.0 lb

## 2024-05-08 VITALS — BP 138/78 | HR 68 | Ht 64.0 in | Wt 174.8 lb

## 2024-05-08 DIAGNOSIS — E559 Vitamin D deficiency, unspecified: Secondary | ICD-10-CM

## 2024-05-08 DIAGNOSIS — R972 Elevated prostate specific antigen [PSA]: Secondary | ICD-10-CM

## 2024-05-08 DIAGNOSIS — E538 Deficiency of other specified B group vitamins: Secondary | ICD-10-CM

## 2024-05-08 DIAGNOSIS — Z23 Encounter for immunization: Secondary | ICD-10-CM | POA: Diagnosis not present

## 2024-05-08 DIAGNOSIS — E7800 Pure hypercholesterolemia, unspecified: Secondary | ICD-10-CM

## 2024-05-08 DIAGNOSIS — R739 Hyperglycemia, unspecified: Secondary | ICD-10-CM | POA: Diagnosis not present

## 2024-05-08 DIAGNOSIS — Z0001 Encounter for general adult medical examination with abnormal findings: Secondary | ICD-10-CM | POA: Diagnosis not present

## 2024-05-08 DIAGNOSIS — I2510 Atherosclerotic heart disease of native coronary artery without angina pectoris: Secondary | ICD-10-CM | POA: Diagnosis not present

## 2024-05-08 DIAGNOSIS — Z125 Encounter for screening for malignant neoplasm of prostate: Secondary | ICD-10-CM | POA: Diagnosis not present

## 2024-05-08 DIAGNOSIS — Z0000 Encounter for general adult medical examination without abnormal findings: Secondary | ICD-10-CM

## 2024-05-08 DIAGNOSIS — R030 Elevated blood-pressure reading, without diagnosis of hypertension: Secondary | ICD-10-CM

## 2024-05-08 LAB — HEPATIC FUNCTION PANEL
ALT: 17 U/L (ref 0–53)
AST: 21 U/L (ref 0–37)
Albumin: 4.6 g/dL (ref 3.5–5.2)
Alkaline Phosphatase: 59 U/L (ref 39–117)
Bilirubin, Direct: 0.2 mg/dL (ref 0.0–0.3)
Total Bilirubin: 0.8 mg/dL (ref 0.2–1.2)
Total Protein: 6.8 g/dL (ref 6.0–8.3)

## 2024-05-08 LAB — URINALYSIS, ROUTINE W REFLEX MICROSCOPIC
Bilirubin Urine: NEGATIVE
Hgb urine dipstick: NEGATIVE
Ketones, ur: NEGATIVE
Leukocytes,Ua: NEGATIVE
Nitrite: NEGATIVE
RBC / HPF: NONE SEEN (ref 0–?)
Specific Gravity, Urine: 1.01 (ref 1.000–1.030)
Total Protein, Urine: NEGATIVE
Urine Glucose: NEGATIVE
Urobilinogen, UA: 0.2 (ref 0.0–1.0)
pH: 7 (ref 5.0–8.0)

## 2024-05-08 LAB — CBC WITH DIFFERENTIAL/PLATELET
Basophils Absolute: 0 K/uL (ref 0.0–0.1)
Basophils Relative: 0.6 % (ref 0.0–3.0)
Eosinophils Absolute: 0.1 K/uL (ref 0.0–0.7)
Eosinophils Relative: 1.2 % (ref 0.0–5.0)
HCT: 49.6 % (ref 39.0–52.0)
Hemoglobin: 16.6 g/dL (ref 13.0–17.0)
Lymphocytes Relative: 26.1 % (ref 12.0–46.0)
Lymphs Abs: 1.7 K/uL (ref 0.7–4.0)
MCHC: 33.4 g/dL (ref 30.0–36.0)
MCV: 98.3 fl (ref 78.0–100.0)
Monocytes Absolute: 0.6 K/uL (ref 0.1–1.0)
Monocytes Relative: 9.5 % (ref 3.0–12.0)
Neutro Abs: 4.2 K/uL (ref 1.4–7.7)
Neutrophils Relative %: 62.6 % (ref 43.0–77.0)
Platelets: 207 K/uL (ref 150.0–400.0)
RBC: 5.05 Mil/uL (ref 4.22–5.81)
RDW: 13.5 % (ref 11.5–15.5)
WBC: 6.7 K/uL (ref 4.0–10.5)

## 2024-05-08 LAB — LIPID PANEL
Cholesterol: 133 mg/dL (ref 0–200)
HDL: 47.9 mg/dL (ref >39.00)
LDL Cholesterol: 61 mg/dL (ref 0–99)
NonHDL: 85.02
Total CHOL/HDL Ratio: 3
Triglycerides: 121 mg/dL (ref 0.0–149.0)
VLDL: 24.2 mg/dL (ref 0.0–40.0)

## 2024-05-08 LAB — BASIC METABOLIC PANEL WITH GFR
BUN: 15 mg/dL (ref 6–23)
CO2: 32 meq/L (ref 19–32)
Calcium: 9.6 mg/dL (ref 8.4–10.5)
Chloride: 101 meq/L (ref 96–112)
Creatinine, Ser: 0.92 mg/dL (ref 0.40–1.50)
GFR: 79.96 mL/min (ref >60.00)
Glucose, Bld: 100 mg/dL — ABNORMAL HIGH (ref 70–99)
Potassium: 4 meq/L (ref 3.5–5.1)
Sodium: 140 meq/L (ref 135–145)

## 2024-05-08 LAB — TSH: TSH: 1.79 u[IU]/mL (ref 0.35–5.50)

## 2024-05-08 LAB — HEMOGLOBIN A1C: Hgb A1c MFr Bld: 5.9 % (ref 4.6–6.5)

## 2024-05-08 LAB — MICROALBUMIN / CREATININE URINE RATIO
Creatinine,U: 61 mg/dL
Microalb Creat Ratio: UNDETERMINED mg/g (ref 0.0–30.0)
Microalb, Ur: <0.7 mg/dL

## 2024-05-08 LAB — PSA: PSA: 1.12 ng/mL (ref 0.10–4.00)

## 2024-05-08 LAB — VITAMIN B12: Vitamin B-12: 472 pg/mL (ref 211–911)

## 2024-05-08 LAB — VITAMIN D 25 HYDROXY (VIT D DEFICIENCY, FRACTURES): VITD: 41.92 ng/mL (ref 30.00–100.00)

## 2024-05-08 NOTE — Assessment & Plan Note (Signed)
 Lab Results  Component Value Date   LDLCALC 61 05/08/2024   Stable, pt to continue current statin pravachol  80 every day, zetia  10 mg qd

## 2024-05-08 NOTE — Assessment & Plan Note (Signed)
 Stable but due for cardiology f/u - will refer back

## 2024-05-08 NOTE — Patient Instructions (Signed)
 Jason Hart,  Thank you for taking the time for your Medicare Wellness Visit. I appreciate your continued commitment to your health goals. Please review the care plan we discussed, and feel free to reach out if I can assist you further.  Medicare recommends these wellness visits once per year to help you and your care team stay ahead of potential health issues. These visits are designed to focus on prevention, allowing your provider to concentrate on managing your acute and chronic conditions during your regular appointments.  Please note that Annual Wellness Visits do not include a physical exam. Some assessments may be limited, especially if the visit was conducted virtually. If needed, we may recommend a separate in-person follow-up with your provider.  Ongoing Care Seeing your primary care provider every 3 to 6 months helps us  monitor your health and provide consistent, personalized care.   Referrals If a referral was made during today's visit and you haven't received any updates within two weeks, please contact the referred provider directly to check on the status.  Recommended Screenings:  Health Maintenance  Topic Date Due   DTaP/Tdap/Td vaccine (1 - Tdap) 02/15/2013   Zoster (Shingles) Vaccine (2 of 2) 03/05/2022   Flu Shot  03/10/2024   Medicare Annual Wellness Visit  05/08/2025   Pneumococcal Vaccine for age over 23  Completed   Hepatitis C Screening  Completed   HPV Vaccine  Aged Out   Meningitis B Vaccine  Aged Out   Colon Cancer Screening  Discontinued   COVID-19 Vaccine  Discontinued       05/08/2024    8:14 AM  Advanced Directives  Does Patient Have a Medical Advance Directive? Yes  Type of Estate agent of [place];Living will  Copy of Healthcare Power of Attorney in Chart? No - copy requested   Advance Care Planning is important because it: Ensures you receive medical care that aligns with your values, goals, and preferences. Provides  guidance to your family and loved ones, reducing the emotional burden of decision-making during critical moments.  Vision: Annual vision screenings are recommended for early detection of glaucoma, cataracts, and diabetic retinopathy. These exams can also reveal signs of chronic conditions such as diabetes and high blood pressure.  Dental: Annual dental screenings help detect early signs of oral cancer, gum disease, and other conditions linked to overall health, including heart disease and diabetes.  Please see the attached documents for additional preventive care recommendations.

## 2024-05-08 NOTE — Progress Notes (Signed)
 Patient ID: Jason Hart, male   DOB: 03/24/1946, 78 y.o.   MRN: [id number]         Chief Complaint:: wellness exam and elevated BP, FH prostate ca, CAD, hld, hyperglycemia       HPI:  Jason Hart is a 78 y.o. male here for wellness exam; for tdap and shingrix at pharmacy, o/w up to date                        Also did have recent mild left hamstring strain, tried PT x 1 episode and did well, did not go back.  Doing well pm methotrexate for psoriatic arthritis.  Has strong FH prostate ca, asking for more frequent screening psa despite his age.  BP has been controlled at home.   Pt denies chest pain, increased sob or doe, wheezing, orthopnea, PND, increased LE swelling, palpitations, dizziness or syncope.  Due for f/u Dr Shlomo cardiology.   Pt denies polydipsia, polyuria, or new focal neuro s/s.      Wt Readings from Last 3 Encounters:  05/08/24 174 lb (78.9 kg)  05/08/24 174 lb 12.8 oz (79.3 kg)  04/13/24 165 lb (74.8 kg)   BP Readings from Last 3 Encounters:  05/08/24 138/78  05/08/24 138/78  04/13/24 (!) 157/66   Immunization History  Administered Date(s) Administered   Fluad Quad(high Dose 65+) 05/08/2019, 04/10/2021, 04/18/2022   Fluad Trivalent(High Dose 65+) 05/07/2023   INFLUENZA, HIGH DOSE SEASONAL PF 05/28/2016, 05/01/2020, 05/08/2024   PFIZER Comirnaty(Gray Top)Covid-19 Tri-Sucrose Vaccine 05/22/2022   PFIZER(Purple Top)SARS-COV-2 Vaccination 09/01/2019, 09/19/2019, 04/23/2020, 08/23/2020   Pfizer Covid-19 Vaccine Bivalent Booster 54yrs & up 05/05/2021   Pneumococcal Conjugate-13 02/24/2016   Pneumococcal Polysaccharide-23 03/16/2017   Tetanus 02/14/2013   Zoster Recombinant(Shingrix) 01/08/2022   Zoster, Live 09/09/2015   Health Maintenance Due  Topic Date Due   DTaP/Tdap/Td (1 - Tdap) 02/15/2013   Zoster Vaccines- Shingrix (2 of 2) 03/05/2022      Past Medical History:  Diagnosis Date   Arthritis    BPH (benign prostatic hypertrophy)    CAD  (coronary artery disease), native coronary artery    Coronary CTA showed coronary calcium score 316 with 25 to 49% stenosis throughout the RCA, small patent ramus, 50 to 69% mid LAD proximal to the takeoff of D2 followed by 25 to 49% stenosis on 09/04/2020 with FFR showing no focal stenosis.   Conductive hearing loss    Hyperlipidemia    Psoriatic arthritis (HCC)    Trigger point with temporomandibular joint pain    Past Surgical History:  Procedure Laterality Date   COLONOSCOPY  2006   Negative;due 2016; [facility]   TONSILLECTOMY      reports that he has never smoked. He has never used smokeless tobacco. He reports that he does not drink alcohol and does not use drugs. family history includes Benign prostatic hyperplasia in his brother; CAD in his brother and brother; Coronary artery disease (age of onset: 68) in his brother; Heart attack (age of onset: 76) in his father; Heart attack (age of onset: 41) in his paternal grandfather; Hyperlipidemia in his father; Prostate cancer in his brother; Sudden death in his father; Throat cancer in his mother. No Known Allergies Current Outpatient Medications on File Prior to Visit  Medication Sig Dispense Refill   ezetimibe  (ZETIA ) 10 MG tablet Take 1 tablet (10 mg total) by mouth daily. 90 tablet 3   fluticasone (CUTIVATE) 0.05 % cream Apply  1 Application topically daily.     folic acid  (FOLVITE ) 1 MG tablet Take 1 tablet (1 mg total) by mouth daily. 90 tablet 3   methotrexate (RHEUMATREX) 2.5 MG tablet Take 2.5 mg by mouth once a week. Caution:Chemotherapy. Protect from light.  4 tablets     pravastatin  (PRAVACHOL ) 80 MG tablet Take 1 tablet (80 mg total) by mouth every evening. 90 tablet 3   tamsulosin  (FLOMAX ) 0.4 MG CAPS capsule Take 1 capsule (0.4 mg total) by mouth daily. 90 capsule 3   aspirin  EC 81 MG tablet Take 1 tablet (81 mg total) by mouth daily. Swallow whole. (Patient not taking: Reported on 05/08/2024) 90 tablet 3   No current  facility-administered medications on file prior to visit.        ROS:  All others reviewed and negative.  Objective        PE:  BP 138/78 (BP Location: Right Arm, Patient Position: Sitting, Cuff Size: Normal)   Pulse (!) 58   Temp 98.7 F (37.1 C) (Oral)   Ht 5' 4 (1.626 m)   Wt 174 lb (78.9 kg)   SpO2 98%   BMI 29.87 kg/m                 Constitutional: Pt appears in NAD               HENT: Head: NCAT.                Right Ear: External ear normal.                 Left Ear: External ear normal.                Eyes: . Pupils are equal, round, and reactive to light. Conjunctivae and EOM are normal               Nose: without d/c or deformity               Neck: Neck supple. Gross normal ROM               Cardiovascular: Normal rate and regular rhythm.                 Pulmonary/Chest: Effort normal and breath sounds without rales or wheezing.                Abd:  Soft, NT, ND, + BS, no organomegaly               Neurological: Pt is alert. At baseline orientation, motor grossly intact               Skin: Skin is warm. No rashes, no other new lesions, LE edema - none               Psychiatric: Pt behavior is normal without agitation   Micro: none  Cardiac tracings I have personally interpreted today:  none  Pertinent Radiological findings (summarize): none   Lab Results  Component Value Date   WBC 6.7 05/08/2024   HGB 16.6 05/08/2024   HCT 49.6 05/08/2024   PLT 207.0 05/08/2024   GLUCOSE 100 (H) 05/08/2024   CHOL 133 05/08/2024   TRIG 121.0 05/08/2024   HDL 47.90 05/08/2024   LDLDIRECT 137.3 10/25/2009   LDLCALC 61 05/08/2024   ALT 17 05/08/2024   AST 21 05/08/2024   NA 140 05/08/2024   K 4.0 05/08/2024   CL 101 05/08/2024   CREATININE  0.92 05/08/2024   BUN 15 05/08/2024   CO2 32 05/08/2024   TSH 1.79 05/08/2024   PSA 1.12 05/08/2024   INR 1.2 02/23/2020   HGBA1C 5.9 05/08/2024   MICROALBUR <0.7 05/08/2024   Assessment/Plan:  Jason Hart is a 78  y.o. White or Caucasian [1] male with  has a past medical history of Arthritis, BPH (benign prostatic hypertrophy), CAD (coronary artery disease), native coronary artery, Conductive hearing loss, Hyperlipidemia, Psoriatic arthritis (HCC), and Trigger point with temporomandibular joint pain.  Encounter for well adult exam with abnormal findings Age and sex appropriate education and counseling updated with regular exercise and diet Referrals for preventative services - none needed Immunizations addressed - for tdap and shingrix at pharmacy Smoking counseling  - none needed Evidence for depression or other mood disorder - none significant Most recent labs reviewed. I have personally reviewed and have noted: 1) the patient's medical and social history 2) The patient's current medications and supplements 3) The patient's height, weight, and BMI have been recorded in the chart   CAD (coronary artery disease), native coronary artery Stable but due for cardiology f/u - will refer back  Hyperlipidemia Lab Results  Component Value Date   LDLCALC 61 05/08/2024   Stable, pt to continue current statin pravachol  80 every day, zetia  10 mg qd   Hyperglycemia Lab Results  Component Value Date   HGBA1C 5.9 05/08/2024   Stable, pt to continue current medical treatment  - diet, wt control   Elevated blood pressure reading without diagnosis of hypertension BP Readings from Last 3 Encounters:  05/08/24 138/78  05/08/24 138/78  04/13/24 (!) 157/66   uncontrolled, pt conts to decline tx for now  Followup: Return in about 6 months (around 11/05/2024).  Lynwood Rush, MD 05/08/2024 9:27 PM [facility] [facility] - [facility]

## 2024-05-08 NOTE — Assessment & Plan Note (Signed)

## 2024-05-08 NOTE — Patient Instructions (Signed)
 Please consider having the Tdap tetanus shot and the Shingles shots at the pharmacy  Please continue all other medications as before, and refills have been done if requested.  Please have the pharmacy call with any other refills you may need.  Please continue your efforts at being more active, low cholesterol diet, and weight control.  You are otherwise up to date with prevention measures today.  Please keep your appointments with your specialists as you may have planned  You will be contacted regarding the referral for: Dr Shlomo for follow up  Please go to the LAB at the blood drawing area for the tests to be done  You will be contacted by phone if any changes need to be made immediately.  Otherwise, you will receive a letter about your results with an explanation, but please check with MyChart first.  Please make an Appointment to return in 6 months, or sooner if needed, also with Lab Appointment for testing done 3-5 days before at the FIRST FLOOR Lab (so this is for TWO appointments - please see the scheduling desk as you leave)

## 2024-05-08 NOTE — Assessment & Plan Note (Signed)
 BP Readings from Last 3 Encounters:  05/08/24 138/78  05/08/24 138/78  04/13/24 (!) 157/66   uncontrolled, pt conts to decline tx for now

## 2024-05-08 NOTE — Progress Notes (Signed)
 Subjective:   Jason Hart is a 78 y.o. who presents for a Medicare Wellness preventive visit.  As a reminder, Annual Wellness Visits don't include a physical exam, and some assessments may be limited, especially if this visit is performed virtually. We may recommend an in-person follow-up visit with your provider if needed.  Visit Complete: In person  Persons Participating in Visit: Patient.  AWV Questionnaire: No: Patient Medicare AWV questionnaire was not completed prior to this visit.  Cardiac Risk Factors include: advanced age (>70men, >66 women);dyslipidemia;obesity (BMI >30kg/m2)     Objective:    Today's Vitals   05/08/24 0814  BP: 138/78  Pulse: 68  SpO2: 97%  Weight: 174 lb 12.8 oz (79.3 kg)  Height: 5' 4 (1.626 m)   Body mass index is 30 kg/m.     05/08/2024    8:14 AM 06/25/2022   11:04 AM 02/23/2020    2:38 PM 03/05/2015    7:32 AM  Advanced Directives  Does Patient Have a Medical Advance Directive? Yes Yes No Yes   Type of Estate agent of [place];Living will Healthcare Power of [place];Living will  Healthcare Power of [place];Living will   Copy of Healthcare Power of Attorney in Chart? No - copy requested No - copy requested    Would patient like information on creating a medical advance directive?   No - Patient declined      Data saved with a previous flowsheet row definition    Current Medications (verified) Outpatient Encounter Medications as of 05/08/2024  Medication Sig   ezetimibe  (ZETIA ) 10 MG tablet Take 1 tablet (10 mg total) by mouth daily.   fluticasone (CUTIVATE) 0.05 % cream Apply 1 Application topically daily.   folic acid  (FOLVITE ) 1 MG tablet Take 1 tablet (1 mg total) by mouth daily.   methotrexate (RHEUMATREX) 2.5 MG tablet Take 2.5 mg by mouth once a week. Caution:Chemotherapy. Protect from light.  4 tablets   pravastatin  (PRAVACHOL ) 80 MG tablet Take 1 tablet (80 mg total) by mouth every evening.    tamsulosin  (FLOMAX ) 0.4 MG CAPS capsule Take 1 capsule (0.4 mg total) by mouth daily.   amitriptyline  (ELAVIL ) 100 MG tablet 1/2 - 1 tab by mouth once at bedtime as needed (Patient not taking: Reported on 05/08/2024)   aspirin  EC 81 MG tablet Take 1 tablet (81 mg total) by mouth daily. Swallow whole. (Patient not taking: Reported on 05/08/2024)   valACYclovir (VALTREX) 1000 MG tablet Take 1,000 mg by mouth 2 (two) times daily. (Patient not taking: Reported on 05/08/2024)   No facility-administered encounter medications on file as of 05/08/2024.    Allergies (verified) Patient has no known allergies.   History: Past Medical History:  Diagnosis Date   Arthritis    BPH (benign prostatic hypertrophy)    CAD (coronary artery disease), native coronary artery    Coronary CTA showed coronary calcium score 316 with 25 to 49% stenosis throughout the RCA, small patent ramus, 50 to 69% mid LAD proximal to the takeoff of D2 followed by 25 to 49% stenosis on 09/04/2020 with FFR showing no focal stenosis.   Conductive hearing loss    Hyperlipidemia    Psoriatic arthritis (HCC)    Trigger point with temporomandibular joint pain    Past Surgical History:  Procedure Laterality Date   COLONOSCOPY  2006   Negative;due 2016; [facility]   TONSILLECTOMY     Family History  Problem Relation Age of Onset   Throat cancer  Mother        smoker   Hyperlipidemia Father    Heart attack Father 78   Sudden death Father    Heart attack Paternal Grandfather 60   Coronary artery disease Brother 63       stent   CAD Brother        stent   Prostate cancer Brother    Benign prostatic hyperplasia Brother        TURP   CAD Brother        stent   Hypertension Neg Hx    Diabetes Neg Hx    Stroke Neg Hx    Colon cancer Neg Hx    Colon polyps Neg Hx    Rectal cancer Neg Hx    Stomach cancer Neg Hx    Social History   Socioeconomic History   Marital status: Widowed    Spouse name: Not on file   Number of  children: Not on file   Years of education: Not on file   Highest education level: Bachelor's degree (e.g., BA, AB, BS)  Occupational History   Occupation: Investment banker, corporate: [employer]  Tobacco Use   Smoking status: Never   Smokeless tobacco: Never  Vaping Use   Vaping status: Never Used  Substance and Sexual Activity   Alcohol use: No    Alcohol/week: 0.0 standard drinks of alcohol    Comment: not since MTX started; occasional   Drug use: No   Sexual activity: Not Currently  Other Topics Concern   Not on file  Social History Narrative   Fun/Hobby: Teacher, English as a foreign language    Social Drivers of Health   Financial Resource Strain: Low Risk  (05/08/2024)   Overall Financial Resource Strain (CARDIA)    Difficulty of Paying Living Expenses: Not hard at all  Food Insecurity: No Food Insecurity (05/08/2024)   Hunger Vital Sign    Worried About Running Out of Food in the Last Year: Never true    Ran Out of Food in the Last Year: Never true  Transportation Needs: No Transportation Needs (05/08/2024)   PRAPARE - Administrator, Civil Service (Medical): No    Lack of Transportation (Non-Medical): No  Physical Activity: Sufficiently Active (05/08/2024)   Exercise Vital Sign    Days of Exercise per Week: 5 days    Minutes of Exercise per Session: 40 min  Stress: No Stress Concern Present (05/08/2024)   [employer] of Occupational Health - Occupational Stress Questionnaire    Feeling of Stress: Not at all  Social Connections: Moderately Isolated (05/08/2024)   Social Connection and Isolation Panel    Frequency of Communication with Friends and Family: Once a week    Frequency of Social Gatherings with Friends and Family: Three times a week    Attends Religious Services: Never    Active Member of Clubs or Organizations: Yes    Attends Banker Meetings: More than 4 times per year    Marital Status: Widowed    Tobacco Counseling Counseling given: Not  Answered    Clinical Intake:  Pre-visit preparation completed: Yes  Pain : No/denies pain     BMI - recorded: 30 Nutritional Status: BMI > 30  Obese Nutritional Risks: None Diabetes: No  Lab Results  Component Value Date   HGBA1C 5.5 05/07/2023   HGBA1C 5.6 04/14/2022   HGBA1C 5.7 (H) 03/21/2020     How often do you need to have someone help you when  you read instructions, pamphlets, or other written materials from your doctor or pharmacy?: 1 - Never  Interpreter Needed?: No  Information entered by :: Verdie Saba, CMA   Activities of Daily Living     05/08/2024    8:17 AM  In your present state of health, do you have any difficulty performing the following activities:  Hearing? 0  Vision? 0  Difficulty concentrating or making decisions? 0  Walking or climbing stairs? 0  Dressing or bathing? 0  Doing errands, shopping? 0  Preparing Food and eating ? N  Using the Toilet? N  In the past six months, have you accidently leaked urine? N  Do you have problems with loss of bowel control? N  Managing your Medications? N  Managing your Finances? N  Housekeeping or managing your Housekeeping? N    Patient Care Team: Norleen Lynwood ORN, MD as PCP - General (Internal Medicine) Shlomo Wilbert SAUNDERS, MD as PCP - Cardiology (Cardiology) Waylan Cain, MD as Consulting Physician (Ophthalmology)  I have updated your Care Teams any recent Medical Services you may have received from other providers in the past year.     Assessment:   This is a routine wellness examination for [facility].  Hearing/Vision screen Hearing Screening - Comments:: Denies hearing difficulties   Vision Screening - Comments:: Wears rx glasses - up to date with routine eye exams with Dr Waylan   Goals Addressed               This Visit's Progress     Patient Stated (pt-stated)        Patient stated today's his birthday and stay active       Depression Screen     05/08/2024    8:18 AM 12/06/2023     9:33 AM 05/07/2023    8:13 AM 06/25/2022   11:07 AM 04/14/2022    8:04 AM 04/10/2021    8:59 AM 03/27/2020    3:56 PM  PHQ 2/9 Scores  PHQ - 2 Score 0 0 0 0 0 0 0  PHQ- 9 Score 0    0      Fall Risk     05/08/2024    8:18 AM 12/06/2023    9:39 AM 05/07/2023    8:13 AM 06/25/2022   11:05 AM 04/14/2022    8:04 AM  Fall Risk   Falls in the past year? 0 0 0 0 0  Number falls in past yr: 0 0 0 0   Injury with Fall? 0 0 0 0 0  Risk for fall due to : No Fall Risks No Fall Risks No Fall Risks No Fall Risks No Fall Risks  Follow up Falls evaluation completed;Falls prevention discussed Falls evaluation completed Falls evaluation completed Falls prevention discussed  Falls evaluation completed      Data saved with a previous flowsheet row definition    MEDICARE RISK AT HOME:  Medicare Risk at Home Any stairs in or around the home?: No If so, are there any without handrails?: No Home free of loose throw rugs in walkways, pet beds, electrical cords, etc?: Yes Adequate lighting in your home to reduce risk of falls?: Yes Life alert?: No Use of a cane, walker or w/c?: No Grab bars in the bathroom?: No Shower chair or bench in shower?: Yes Elevated toilet seat or a handicapped toilet?: No  TIMED UP AND GO:  Was the test performed?  No  Cognitive Function: 6CIT completed  05/08/2024    8:21 AM 06/25/2022   11:10 AM  6CIT Screen  What Year? 0 points 0 points  What month? 0 points 0 points  What time? 0 points 0 points  Count back from 20 0 points 0 points  Months in reverse 0 points 0 points  Repeat phrase 0 points 0 points  Total Score 0 points 0 points    Immunizations Immunization History  Administered Date(s) Administered   Fluad Quad(high Dose 65+) 05/08/2019, 04/10/2021, 04/18/2022   Fluad Trivalent(High Dose 65+) 05/07/2023   INFLUENZA, HIGH DOSE SEASONAL PF 05/28/2016, 05/01/2020, 05/08/2024   PFIZER Comirnaty(Gray Top)Covid-19 Tri-Sucrose Vaccine 05/22/2022    PFIZER(Purple Top)SARS-COV-2 Vaccination 09/01/2019, 09/19/2019, 04/23/2020, 08/23/2020   Pfizer Covid-19 Vaccine Bivalent Booster 41yrs & up 05/05/2021   Pneumococcal Conjugate-13 02/24/2016   Pneumococcal Polysaccharide-23 03/16/2017   Tetanus 02/14/2013   Zoster Recombinant(Shingrix) 01/08/2022   Zoster, Live 09/09/2015    Screening Tests Health Maintenance  Topic Date Due   DTaP/Tdap/Td (1 - Tdap) 02/15/2013   Zoster Vaccines- Shingrix (2 of 2) 03/05/2022   Medicare Annual Wellness (AWV)  05/08/2025   Pneumococcal Vaccine: 50+ Years  Completed   Influenza Vaccine  Completed   Hepatitis C Screening  Completed   HPV VACCINES  Aged Out   Meningococcal B Vaccine  Aged Out   Colonoscopy  Discontinued   COVID-19 Vaccine  Discontinued    Health Maintenance Items Addressed:  Vaccines Given today: Influenza vaccine  Additional Screening:  Vision Screening: Recommended annual ophthalmology exams for early detection of glaucoma and other disorders of the eye. Is the patient up to date with their annual eye exam?  Yes  Who is the provider or what is the name of the office in which the patient attends annual eye exams? [facility]  Dental Screening: Recommended annual dental exams for proper oral hygiene  Community Resource Referral / Chronic Care Management: CRR required this visit?  No   CCM required this visit?  No   Plan:    I have personally reviewed and noted the following in the patient's chart:   Medical and social history Use of alcohol, tobacco or illicit drugs  Current medications and supplements including opioid prescriptions. Patient is not currently taking opioid prescriptions. Functional ability and status Nutritional status Physical activity Advanced directives List of other physicians Hospitalizations, surgeries, and ER visits in previous 12 months Vitals Screenings to include cognitive, depression, and falls Referrals and appointments  In  addition, I have reviewed and discussed with patient certain preventive protocols, quality metrics, and best practice recommendations. A written personalized care plan for preventive services as well as general preventive health recommendations were provided to patient.   Verdie CHRISTELLA Saba, CMA   05/08/2024   After Visit Summary: (In Person-Declined) Patient declined AVS at this time.  Notes: Nothing significant to report at this time.

## 2024-05-08 NOTE — Assessment & Plan Note (Signed)
 Lab Results  Component Value Date   HGBA1C 5.9 05/08/2024   Stable, pt to continue current medical treatment  - diet, wt control

## 2024-05-12 DIAGNOSIS — L4050 Arthropathic psoriasis, unspecified: Secondary | ICD-10-CM | POA: Diagnosis not present

## 2024-05-22 ENCOUNTER — Other Ambulatory Visit: Payer: Self-pay | Admitting: Internal Medicine

## 2024-05-22 ENCOUNTER — Other Ambulatory Visit: Payer: Self-pay

## 2024-06-24 ENCOUNTER — Other Ambulatory Visit: Payer: Self-pay | Admitting: Internal Medicine

## 2024-06-26 ENCOUNTER — Other Ambulatory Visit: Payer: Self-pay

## 2024-08-15 NOTE — Progress Notes (Unsigned)
 "   Cardiology Office Note    Date:  08/16/2024   ID:  Jason Hart, DOB 1945/10/28, MRN [id number]  PCP:  Norleen Lynwood ORN, MD  Cardiologist:  Wilbert Bihari, MD   Chief Complaint  Patient presents with   Coronary Artery Disease   Hyperlipidemia     History of Present Illness:  Jason Hart is a 79 y.o. male  with a hx of HLD, psoriatic arthritis and BPH who underwent screening coronary CA scoring to assess cardiac risk.  His calcium score was elevated at 225 but only 39th % for age and sex matched controls.  He also has aortic atherosclerosis.  Coronary CTA showed coronary calcium score 316 with 25 to 49% stenosis throughout the RCA, small patent ramus, 50 to 69% mid LAD proximal to the takeoff of D2 followed by 25 to 49% stenosis on 09/04/2020 with FFR showing no focal stenosis.  He has never smoked but has a strong exposure hx of second hand smoke from both parents growing up.  He also has a fm hx of premature CAD.  His father died of an SCD/MI at age 63 and his brother had an MI at age 47 and paternal GF had an MI at 74.    He is here today for follow-up and is doing well.  He denies any chest pain or pressure, SOB, DOE, PND, orthopnea, lower extreme edema, dizziness, palpitations or syncope.  Past Medical History:  Diagnosis Date   Arthritis    BPH (benign prostatic hypertrophy)    CAD (coronary artery disease), native coronary artery    Coronary CTA showed coronary calcium score 316 with 25 to 49% stenosis throughout the RCA, small patent ramus, 50 to 69% mid LAD proximal to the takeoff of D2 followed by 25 to 49% stenosis on 09/04/2020 with FFR showing no focal stenosis.   Conductive hearing loss    Hyperlipidemia    Psoriatic arthritis (HCC)    Trigger point with temporomandibular joint pain     Past Surgical History:  Procedure Laterality Date   COLONOSCOPY  2006   Negative;due 2016; [facility]   TONSILLECTOMY      Current Medications: Current Meds   Medication Sig   aspirin  EC 81 MG tablet Take 1 tablet (81 mg total) by mouth daily. Swallow whole.   ezetimibe  (ZETIA ) 10 MG tablet TAKE 1 TABLET BY MOUTH DAILY   fluticasone (CUTIVATE) 0.05 % cream Apply 1 Application topically daily.   folic acid  (FOLVITE ) 1 MG tablet Take 1 tablet (1 mg total) by mouth daily.   methotrexate (RHEUMATREX) 2.5 MG tablet Take 2.5 mg by mouth once a week. Caution:Chemotherapy. Protect from light.  4 tablets   pravastatin  (PRAVACHOL ) 80 MG tablet TAKE ONE TABLET BY MOUTH EVERY EVENING   tamsulosin  (FLOMAX ) 0.4 MG CAPS capsule TAKE 1 CAPSULE BY MOUTH DAILY    Allergies:   Patient has no known allergies.   Social History   Socioeconomic History   Marital status: Widowed    Spouse name: Not on file   Number of children: Not on file   Years of education: Not on file   Highest education level: Bachelor's degree (e.g., BA, AB, BS)  Occupational History   Occupation: Investment Banker, Corporate: [employer]  Tobacco Use   Smoking status: Never   Smokeless tobacco: Never  Vaping Use   Vaping status: Never Used  Substance and Sexual Activity   Alcohol use: No    Alcohol/week:  0.0 standard drinks of alcohol    Comment: not since MTX started; occasional   Drug use: No   Sexual activity: Not Currently  Other Topics Concern   Not on file  Social History Narrative   Fun/Hobby: Golfer    Social Drivers of Health   Tobacco Use: Low Risk (08/16/2024)   Patient History    Smoking Tobacco Use: Never    Smokeless Tobacco Use: Never    Passive Exposure: Not on file  Financial Resource Strain: Low Risk (05/08/2024)   Overall Financial Resource Strain (CARDIA)    Difficulty of Paying Living Expenses: Not hard at all  Food Insecurity: No Food Insecurity (05/08/2024)   Epic    Worried About Radiation Protection Practitioner of Food in the Last Year: Never true    Ran Out of Food in the Last Year: Never true  Transportation Needs: No Transportation Needs (05/08/2024)   Epic     Lack of Transportation (Medical): No    Lack of Transportation (Non-Medical): No  Physical Activity: Sufficiently Active (05/08/2024)   Exercise Vital Sign    Days of Exercise per Week: 5 days    Minutes of Exercise per Session: 40 min  Stress: No Stress Concern Present (05/08/2024)   [employer] of Occupational Health - Occupational Stress Questionnaire    Feeling of Stress: Not at all  Social Connections: Moderately Isolated (05/08/2024)   Social Connection and Isolation Panel    Frequency of Communication with Friends and Family: Once a week    Frequency of Social Gatherings with Friends and Family: Three times a week    Attends Religious Services: Never    Active Member of Clubs or Organizations: Yes    Attends Banker Meetings: More than 4 times per year    Marital Status: Widowed  Depression (PHQ2-9): Low Risk (05/08/2024)   Depression (PHQ2-9)    PHQ-2 Score: 0  Alcohol Screen: Low Risk (05/08/2024)   Alcohol Screen    Last Alcohol Screening Score (AUDIT): 2  Housing: Unknown (05/08/2024)   Epic    Unable to Pay for Housing in the Last Year: No    Number of Times Moved in the Last Year: Not on file    Homeless in the Last Year: No  Utilities: Not At Risk (05/08/2024)   Epic    Threatened with loss of utilities: No  Health Literacy: Adequate Health Literacy (05/08/2024)   B1300 Health Literacy    Frequency of need for help with medical instructions: Never     Family History:  The patient's family history includes Benign prostatic hyperplasia in his brother; CAD in his brother and brother; Coronary artery disease (age of onset: 34) in his brother; Heart attack (age of onset: 75) in his father; Heart attack (age of onset: 62) in his paternal grandfather; Hyperlipidemia in his father; Prostate cancer in his brother; Sudden death in his father; Throat cancer in his mother.   ROS:   Please see the history of present illness.    ROS All other systems reviewed and  are negative.     06/28/2020    1:29 PM  PAD Screen  Previous PAD dx? No  Previous surgical procedure? No  Pain with walking? No  Feet/toe relief with dangling? No  Painful, non-healing ulcers? No  Extremities discolored? No   EKG Interpretation Date/Time:  Wednesday August 16 2024 08:28:08 EST Ventricular Rate:  65 PR Interval:  150 QRS Duration:  86 QT Interval:  398 QTC Calculation: 413 R  Axis:   43  Text Interpretation: Normal sinus rhythm Normal ECG When compared with ECG of 23-Feb-2020 14:42, PREVIOUS ECG IS PRESENT Confirmed by Shlomo Corning ([id number]) on 08/16/2024 8:34:50 AM      PHYSICAL EXAM:   VS:  BP 134/62   Pulse 67   Ht 5' 4 (1.626 m)   Wt 178 lb 12.8 oz (81.1 kg)   SpO2 95%   BMI 30.69 kg/m    GEN: Well nourished, well developed in no acute distress HEENT: Normal NECK: No JVD; No carotid bruits LYMPHATICS: No lymphadenopathy CARDIAC:RRR, no murmurs, rubs, gallops RESPIRATORY:  Clear to auscultation without rales, wheezing or rhonchi  ABDOMEN: Soft, non-tender, non-distended MUSCULOSKELETAL:  No edema; No deformity  SKIN: Warm and dry NEUROLOGIC:  Alert and oriented x 3 PSYCHIATRIC:  Normal affect  Wt Readings from Last 3 Encounters:  08/16/24 178 lb 12.8 oz (81.1 kg)  05/08/24 174 lb (78.9 kg)  05/08/24 174 lb 12.8 oz (79.3 kg)      Studies/Labs Reviewed:     Recent Labs: 05/08/2024: ALT 17; BUN 15; Creatinine, Ser 0.92; Hemoglobin 16.6; Platelets 207.0; Potassium 4.0; Sodium 140; TSH 1.79   Lipid Panel    Component Value Date/Time   CHOL 133 05/08/2024 1044   CHOL 130 08/26/2021 0830   CHOL 150 02/13/2015 0923   TRIG 121.0 05/08/2024 1044   TRIG 100 02/13/2015 0923   HDL 47.90 05/08/2024 1044   HDL 49 08/26/2021 0830   HDL 46 02/13/2015 0923   CHOLHDL 3 05/08/2024 1044   VLDL 24.2 05/08/2024 1044   LDLCALC 61 05/08/2024 1044   LDLCALC 66 08/26/2021 0830   LDLCALC 98 03/21/2020 0820   LDLCALC 84 02/13/2015 0923   LDLDIRECT 137.3  10/25/2009 0000      Additional studies/ records that were reviewed today include:  Office notes from PCP, EKG    ASSESSMENT:    1. Coronary artery disease due to lipid rich plaque   2. Aortic atherosclerosis   3. Pure hypercholesterolemia       PLAN:  In order of problems listed above:  ASCAD -calcium score was elevated at 225 but only 39th % for age and sex matched controls. -Coronary CTA showed coronary calcium score 316 with 25 to 49% stenosis throughout the RCA, small patent ramus, 50 to 69% mid LAD proximal to the takeoff of D2 followed by 25 to 49% stenosis on 09/04/2020 with FFR showing no focal stenosis. -He has a strong fm hx of premature CAD with his father dying of an MI at 9 and his brother having an MI at 36 and GF at 47.  -his CRFs include premature fm hs, extensive second hand smoke exposure as a child and HLD -He has not had any anginal symptoms since I saw him last -he has a borderline lesion int he LAD and despite no symptoms will  get a Stress PET CT since coronary CTA was 3 years ago Informed Consent   Shared Decision Making/Informed Consent The risks [chest pain, shortness of breath, cardiac arrhythmias, dizziness, blood pressure fluctuations, myocardial infarction, stroke/transient ischemic attack, nausea, vomiting, allergic reaction, radiation exposure, metallic taste sensation and life-threatening complications (estimated to be 1 in 10,000)], benefits (risk stratification, diagnosing coronary artery disease, treatment guidance) and alternatives of a cardiac PET stress test were discussed in detail with Mr. Fjeld and he agrees to proceed. -continue  aggressive risk factor modification -Continue aspirin  81 mg daily, Zetia  10 mg daily pravastatin  80 mg daily with as needed  refills -continue in daily exercise routine>>no sx with exercise  Aortic atherosclerosis -noted on Chest CT -continue statin -BP remains adequately controlled  HLD -LDL goal is  less than 70  -I have personally reviewed and interpreted outside labs performed by patient's PCP which showed  LDL 61, HDL 50 on 04/2024 -Continue Zetia  10 mg daily and pravastatin  80 mg daily with as needed refills  Follow-up with me in 1 year   Medication Adjustments/Labs and Tests Ordered: Current medicines are reviewed at length with the patient today.  Concerns regarding medicines are outlined above.  Medication changes, Labs and Tests ordered today are listed in the Patient Instructions below.  There are no Patient Instructions on file for this visit.   Signed, Wilbert Bihari, MD  08/16/2024 8:36 AM    [facility] [address] [place], [place], [place]  [address] Phone: [phone]; Fax: [phone]   "

## 2024-08-16 ENCOUNTER — Ambulatory Visit: Admitting: Cardiology

## 2024-08-16 ENCOUNTER — Encounter: Payer: Self-pay | Admitting: Cardiology

## 2024-08-16 VITALS — BP 134/62 | HR 67 | Ht 64.0 in | Wt 178.8 lb

## 2024-08-16 DIAGNOSIS — I700 Atherosclerosis of aorta: Secondary | ICD-10-CM

## 2024-08-16 DIAGNOSIS — I2510 Atherosclerotic heart disease of native coronary artery without angina pectoris: Secondary | ICD-10-CM

## 2024-08-16 DIAGNOSIS — E7800 Pure hypercholesterolemia, unspecified: Secondary | ICD-10-CM

## 2024-08-16 DIAGNOSIS — I2583 Coronary atherosclerosis due to lipid rich plaque: Secondary | ICD-10-CM

## 2024-08-16 NOTE — Addendum Note (Signed)
 Addended by: JANIT GENI CROME on: 08/16/2024 09:13 AM   Modules accepted: Orders

## 2024-08-16 NOTE — Addendum Note (Signed)
 Addended by: SHLOMO WILBERT SAUNDERS on: 08/16/2024 09:57 AM   Modules accepted: Orders

## 2024-08-16 NOTE — Patient Instructions (Signed)
 Medication Instructions:  Your physician recommends that you continue on your current medications as directed. Please refer to the Current Medication list given to you today.  *If you need a refill on your cardiac medications before your next appointment, please call your pharmacy*  Lab Work: None.  If you have labs (blood work) drawn today and your tests are completely normal, you will receive your results only by: MyChart Message (if you have MyChart) OR A paper copy in the mail If you have any lab test that is abnormal or we need to change your treatment, we will call you to review the results.  Testing/Procedures:    Please report to Radiology at the [facility] Main Entrance 30 minutes early for your test.  [address] [place], [place] [address]                          How to Prepare for Your Cardiac PET/CT Stress Test:  Nothing to eat or drink, except water, 3 hours prior to arrival time.  NO caffeine/decaffeinated products, or chocolate 12 hours prior to arrival. (Please note decaffeinated beverages (teas/coffees) still contain caffeine).  If you have caffeine within 12 hours prior, the test will need to be rescheduled.  Medication instructions: Do not take erectile dysfunction medications for 72 hours prior to test (sildenafil, tadalafil) Do not take nitrates (isosorbide mononitrate, Ranexa) the day before or day of test Do not take tamsulosin  the day before or morning of test Hold theophylline containing medications for 12 hours. Hold Dipyridamole 48 hours prior to the test.  You may take your remaining medications with water.  NO perfume, cologne or lotion on chest or abdomen area. FEMALES - Please avoid wearing dresses to this appointment.  Total time is 1 to 2 hours; you may want to bring reading material for the waiting time.  IF YOU THINK YOU MAY BE PREGNANT, OR ARE NURSING PLEASE INFORM THE TECHNOLOGIST.  In preparation for your appointment,  medication and supplies will be purchased.  Appointment availability is limited, so if you need to cancel or reschedule, please call the Radiology Department Scheduler at [phone] 24 hours in advance to avoid a cancellation fee of $100.00  What to Expect When you Arrive:  Once you arrive and check in for your appointment, you will be taken to a preparation room within the Radiology Department.  A technologist or Nurse will obtain your medical history, verify that you are correctly prepped for the exam, and explain the procedure.  Afterwards, an IV will be started in your arm and electrodes will be placed on your skin for EKG monitoring during the stress portion of the exam. Then you will be escorted to the PET/CT scanner.  There, staff will get you positioned on the scanner and obtain a blood pressure and EKG.  During the exam, you will continue to be connected to the EKG and blood pressure machines.  A small, safe amount of a radioactive tracer will be injected in your IV to obtain a series of pictures of your heart along with an injection of a stress agent.    After your Exam:  It is recommended that you eat a meal and drink a caffeinated beverage to counter act any effects of the stress agent.  Drink plenty of fluids for the remainder of the day and urinate frequently for the first couple of hours after the exam.  Your doctor will inform you of  your test results within 7-10 business days.  For more information and frequently asked questions, please visit our website: [email or web address]  For questions about your test or how to prepare for your test, please call: Cardiac Imaging Nurse Navigators Office: [phone]   Follow-Up: At [facility], you and your health needs are our priority.  As part of our continuing mission to provide you with exceptional heart care, our providers are all part of one team.  This team includes your primary Cardiologist (physician) and  Advanced Practice Providers or APPs (Physician Assistants and Nurse Practitioners) who all work together to provide you with the care you need, when you need it.  Your next appointment:   1 year(s)  Provider:   Wilbert Bihari, MD

## 2024-08-16 NOTE — Addendum Note (Signed)
 Addended by: JANIT GENI CROME on: 08/16/2024 08:48 AM   Modules accepted: Orders

## 2024-08-21 ENCOUNTER — Other Ambulatory Visit

## 2024-08-21 ENCOUNTER — Ambulatory Visit: Payer: Self-pay | Admitting: Internal Medicine

## 2024-08-21 DIAGNOSIS — R972 Elevated prostate specific antigen [PSA]: Secondary | ICD-10-CM | POA: Diagnosis not present

## 2024-08-21 LAB — PSA: PSA: 1.19 ng/mL (ref 0.10–4.00)

## 2024-08-21 NOTE — Progress Notes (Signed)
 The test results show that your current treatment is OK, as the tests are stable.  Please continue the same plan.  There is no other need for change of treatment or further evaluation based on these results, at this time.  thanks

## 2024-09-15 ENCOUNTER — Encounter (HOSPITAL_COMMUNITY): Payer: Self-pay

## 2024-09-19 ENCOUNTER — Ambulatory Visit (HOSPITAL_COMMUNITY)

## 2025-05-07 ENCOUNTER — Encounter: Admitting: Family Medicine

## 2025-05-11 ENCOUNTER — Ambulatory Visit

## 2025-05-11 ENCOUNTER — Encounter: Admitting: Internal Medicine
